# Patient Record
Sex: Male | Born: 1945 | Race: Black or African American | Hispanic: No | Marital: Married | Smoking: Never smoker
Health system: Southern US, Community
[De-identification: ages and names within clinical notes are randomized; demographics above are authoritative.]

## PROBLEM LIST (undated history)

## (undated) DIAGNOSIS — H409 Unspecified glaucoma: Secondary | ICD-10-CM

## (undated) DIAGNOSIS — I10 Essential (primary) hypertension: Secondary | ICD-10-CM

## (undated) DIAGNOSIS — C9000 Multiple myeloma not having achieved remission: Secondary | ICD-10-CM

## (undated) DIAGNOSIS — E785 Hyperlipidemia, unspecified: Secondary | ICD-10-CM

## (undated) DIAGNOSIS — G629 Polyneuropathy, unspecified: Secondary | ICD-10-CM

## (undated) DIAGNOSIS — M1990 Unspecified osteoarthritis, unspecified site: Secondary | ICD-10-CM

## (undated) DIAGNOSIS — O2230 Deep phlebothrombosis in pregnancy, unspecified trimester: Secondary | ICD-10-CM

## (undated) HISTORY — PX: PARATHYROID EXPLORATION: SHX732

## (undated) HISTORY — DX: Hyperlipidemia, unspecified: E78.5

## (undated) HISTORY — DX: Unspecified osteoarthritis, unspecified site: M19.90

## (undated) HISTORY — DX: Essential (primary) hypertension: I10

## (undated) HISTORY — DX: Multiple myeloma not having achieved remission: C90.00

## (undated) HISTORY — DX: Deep phlebothrombosis in pregnancy, unspecified trimester: O22.30

## (undated) HISTORY — PX: EYE SURGERY: SHX253

## (undated) HISTORY — PX: JOINT REPLACEMENT: SHX530

## (undated) HISTORY — PX: CATARACT EXTRACTION, BILATERAL: SHX1313

## (undated) HISTORY — PX: VASECTOMY: SHX75

## (undated) HISTORY — DX: Unspecified glaucoma: H40.9

## (undated) HISTORY — DX: Polyneuropathy, unspecified: G62.9

---

## 2015-03-21 DIAGNOSIS — M1710 Unilateral primary osteoarthritis, unspecified knee: Secondary | ICD-10-CM | POA: Insufficient documentation

## 2015-04-20 DIAGNOSIS — R531 Weakness: Secondary | ICD-10-CM | POA: Insufficient documentation

## 2015-04-20 DIAGNOSIS — M1611 Unilateral primary osteoarthritis, right hip: Secondary | ICD-10-CM | POA: Insufficient documentation

## 2015-04-20 DIAGNOSIS — R262 Difficulty in walking, not elsewhere classified: Secondary | ICD-10-CM | POA: Insufficient documentation

## 2015-04-20 DIAGNOSIS — M1711 Unilateral primary osteoarthritis, right knee: Secondary | ICD-10-CM | POA: Insufficient documentation

## 2015-04-20 DIAGNOSIS — S83206A Unspecified tear of unspecified meniscus, current injury, right knee, initial encounter: Secondary | ICD-10-CM | POA: Insufficient documentation

## 2015-12-21 DIAGNOSIS — E7800 Pure hypercholesterolemia, unspecified: Secondary | ICD-10-CM | POA: Insufficient documentation

## 2016-09-30 DIAGNOSIS — N181 Chronic kidney disease, stage 1: Secondary | ICD-10-CM | POA: Insufficient documentation

## 2016-09-30 DIAGNOSIS — R809 Proteinuria, unspecified: Secondary | ICD-10-CM | POA: Insufficient documentation

## 2016-11-24 HISTORY — PX: KNEE SURGERY: SHX244

## 2017-08-06 DIAGNOSIS — Z96651 Presence of right artificial knee joint: Secondary | ICD-10-CM | POA: Insufficient documentation

## 2017-08-06 DIAGNOSIS — H409 Unspecified glaucoma: Secondary | ICD-10-CM | POA: Insufficient documentation

## 2017-08-06 DIAGNOSIS — N400 Enlarged prostate without lower urinary tract symptoms: Secondary | ICD-10-CM | POA: Insufficient documentation

## 2017-08-10 DIAGNOSIS — M25561 Pain in right knee: Secondary | ICD-10-CM | POA: Insufficient documentation

## 2017-08-10 DIAGNOSIS — M6281 Muscle weakness (generalized): Secondary | ICD-10-CM | POA: Insufficient documentation

## 2017-09-18 DIAGNOSIS — M2560 Stiffness of unspecified joint, not elsewhere classified: Secondary | ICD-10-CM | POA: Insufficient documentation

## 2017-09-18 DIAGNOSIS — M51369 Other intervertebral disc degeneration, lumbar region without mention of lumbar back pain or lower extremity pain: Secondary | ICD-10-CM | POA: Insufficient documentation

## 2017-09-18 DIAGNOSIS — M48061 Spinal stenosis, lumbar region without neurogenic claudication: Secondary | ICD-10-CM | POA: Insufficient documentation

## 2018-08-10 DIAGNOSIS — M542 Cervicalgia: Secondary | ICD-10-CM | POA: Insufficient documentation

## 2018-08-10 DIAGNOSIS — M25512 Pain in left shoulder: Secondary | ICD-10-CM | POA: Insufficient documentation

## 2018-08-10 DIAGNOSIS — M4000 Postural kyphosis, site unspecified: Secondary | ICD-10-CM | POA: Insufficient documentation

## 2019-01-25 DIAGNOSIS — E213 Hyperparathyroidism, unspecified: Secondary | ICD-10-CM | POA: Insufficient documentation

## 2021-11-27 ENCOUNTER — Telehealth: Payer: Self-pay | Admitting: Hematology

## 2021-11-27 NOTE — Telephone Encounter (Signed)
Scheduled appt per 1/4 referral. Pt is aware of appt date and time. Pt is aware to arrive 15 mins prior to appt time.  °

## 2021-12-12 ENCOUNTER — Other Ambulatory Visit: Payer: Self-pay

## 2021-12-12 ENCOUNTER — Inpatient Hospital Stay: Payer: Medicare Other

## 2021-12-12 ENCOUNTER — Inpatient Hospital Stay: Payer: Medicare Other | Attending: Hematology | Admitting: Hematology

## 2021-12-12 VITALS — BP 130/76 | HR 72 | Temp 97.5°F | Resp 20 | Wt 226.5 lb

## 2021-12-12 DIAGNOSIS — Z87891 Personal history of nicotine dependence: Secondary | ICD-10-CM | POA: Diagnosis not present

## 2021-12-12 DIAGNOSIS — D472 Monoclonal gammopathy: Secondary | ICD-10-CM | POA: Insufficient documentation

## 2021-12-12 DIAGNOSIS — K219 Gastro-esophageal reflux disease without esophagitis: Secondary | ICD-10-CM | POA: Diagnosis not present

## 2021-12-12 DIAGNOSIS — I10 Essential (primary) hypertension: Secondary | ICD-10-CM | POA: Diagnosis not present

## 2021-12-12 DIAGNOSIS — R251 Tremor, unspecified: Secondary | ICD-10-CM

## 2021-12-12 LAB — CBC WITH DIFFERENTIAL/PLATELET
Abs Immature Granulocytes: 0 10*3/uL (ref 0.00–0.07)
Basophils Absolute: 0 10*3/uL (ref 0.0–0.1)
Basophils Relative: 1 %
Eosinophils Absolute: 0 10*3/uL (ref 0.0–0.5)
Eosinophils Relative: 2 %
HCT: 39.6 % (ref 39.0–52.0)
Hemoglobin: 13.4 g/dL (ref 13.0–17.0)
Immature Granulocytes: 0 %
Lymphocytes Relative: 46 %
Lymphs Abs: 0.9 10*3/uL (ref 0.7–4.0)
MCH: 30.3 pg (ref 26.0–34.0)
MCHC: 33.8 g/dL (ref 30.0–36.0)
MCV: 89.6 fL (ref 80.0–100.0)
Monocytes Absolute: 0.2 10*3/uL (ref 0.1–1.0)
Monocytes Relative: 12 %
Neutro Abs: 0.8 10*3/uL — ABNORMAL LOW (ref 1.7–7.7)
Neutrophils Relative %: 39 %
Platelets: 182 10*3/uL (ref 150–400)
RBC: 4.42 MIL/uL (ref 4.22–5.81)
RDW: 13 % (ref 11.5–15.5)
WBC: 1.9 10*3/uL — ABNORMAL LOW (ref 4.0–10.5)
nRBC: 0 % (ref 0.0–0.2)

## 2021-12-12 LAB — CMP (CANCER CENTER ONLY)
ALT: 26 U/L (ref 0–44)
AST: 27 U/L (ref 15–41)
Albumin: 4.2 g/dL (ref 3.5–5.0)
Alkaline Phosphatase: 37 U/L — ABNORMAL LOW (ref 38–126)
Anion gap: 6 (ref 5–15)
BUN: 22 mg/dL (ref 8–23)
CO2: 28 mmol/L (ref 22–32)
Calcium: 10.7 mg/dL — ABNORMAL HIGH (ref 8.9–10.3)
Chloride: 103 mmol/L (ref 98–111)
Creatinine: 1.1 mg/dL (ref 0.61–1.24)
GFR, Estimated: >60 mL/min (ref >60)
Glucose, Bld: 99 mg/dL (ref 70–99)
Potassium: 4.1 mmol/L (ref 3.5–5.1)
Sodium: 137 mmol/L (ref 135–145)
Total Bilirubin: 0.6 mg/dL (ref 0.3–1.2)
Total Protein: 7.7 g/dL (ref 6.5–8.1)

## 2021-12-12 LAB — SEDIMENTATION RATE: Sed Rate: 5 mm/hr (ref 0–16)

## 2021-12-12 LAB — TSH: TSH: 1.295 u[IU]/mL (ref 0.320–4.118)

## 2021-12-12 LAB — LACTATE DEHYDROGENASE: LDH: 165 U/L (ref 98–192)

## 2021-12-12 NOTE — Progress Notes (Addendum)
[name]   HEMATOLOGY/ONCOLOGY CONSULTATION NOTE  Date of Service: 12/12/2021  Patient Care Team: [name], MD as PCP - General (Family Medicine)  CHIEF COMPLAINTS/PURPOSE OF CONSULTATION:  Smoldering Myeloma  HISTORY OF PRESENTING ILLNESS:   Nathan Martinez is a wonderful 76 y.o. 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 Jesusita Oka MD at [facility] oncology hematology practice.  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 He was most recently seen by his previous oncologist on 08/29/2021 at which time he was noted to have stable smoldering myeloma with no indication for treatment.  His last PET CT scan was apparently on 07/06/2020 and showed no pathologic marrow uptake or lytic lesions in the bones or any signs of extraosseous plasmacytomas.  His last bone marrow biopsy was done on 08/17/2021 and was noted to have 30% overall marrow cellularity, 15% abnormal plasma cells with kappa light chain clonality, normal plasmablastic , Congo red stain negative .mild focal fibrosis grade 1, adequate iron levels Myeloma FISH panel apparently unremarkable.  Last available labs from 08/29/2021 showed  -CBC with a hemoglobin of 12.7, WBC count of 2.47k with an ANC of 1.36k and platelets of 163k -CMP 08/17/2021: Sodium 139, potassium 4.5, creatinine 1.07, calcium 10.5 normal LFTs -Last M spike was noted to be 0.99 with a free kappa lambda ratio of 7.81.  Patient currently notes no new focal bone pains. No fevers no chills no night sweats no unexpected weight loss. Has been eating and drinking well. Notes he is settling into [place]  quite well without any concerns.  MEDICAL HISTORY:  Intermittent hypercalcemia - primary hyperparathyroidism status post parathyroidectomy in 2019 HTN HLD Glaucoma History of Helicobacter pylori infection previously treated Chronic leukopenia/neutropenia History of present iron deficiency anemia  SURGICAL HISTORY:  Rt TKA 2018 Parathyroidectomy 2019 B/l cataracts sx 2021  SOCIAL HISTORY: Social History   Socioeconomic History   Marital status: Married    Spouse name: Not on file   Number of children: Not on file   Years of education: Not on file   Highest education level: Not on file  Occupational History   Not on file  Tobacco Use   Smoking status: Not on file   Smokeless tobacco: Not on file  Substance and Sexual Activity   Alcohol use: Not on file   Drug use: Not on file   Sexual activity: Not on file  Other Topics Concern   Not on file  Social History Narrative   Not on file   Social Determinants of Health   Financial Resource Strain: Not on file  Food Insecurity: Not on file  Transportation Needs: Not on file  Physical Activity: Not on file  Stress: Not on file  Social Connections: Not on file  Intimate Partner Violence: Not on file  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  MEDICATIONS:   Krill oil Olmesartan hydrochlorothiazide 20-12.5 mg Ferrex 150 mg p.o.  daily Aspirin 81 mg p.o. daily Atorvastatin 20 mg p.o. daily Multivitamin 1 tablet p.o. daily Ergocalciferol 50,000 units once weekly   REVIEW OF SYSTEMS:    10 Point review of Systems was done is negative except as noted above.  PHYSICAL EXAMINATION: ECOG PERFORMANCE STATUS: 1 - Symptomatic but completely ambulatory  . Vitals:   12/12/21 1133  BP: 130/75  Pulse: 72  Resp: 20  Temp: (!) 97.5 F  (36.4 C)  SpO2: 100%   Filed Weights   12/12/21 1133  Weight: 226 lb 8 oz (102.7 kg)   .There is no height or weight on file to calculate BMI.  GENERAL:alert, in no acute distress and comfortable SKIN: no acute rashes, no significant lesions EYES: conjunctiva are pink and non-injected, sclera anicteric OROPHARYNX: MMM, no exudates, no oropharyngeal erythema or ulceration NECK: supple, no JVD LYMPH:  no palpable lymphadenopathy in the cervical, axillary or inguinal regions LUNGS: clear to auscultation b/l with normal respiratory effort HEART: regular rate & rhythm ABDOMEN:  normoactive bowel sounds , non tender, not distended. Extremity: no pedal edema PSYCH: alert & oriented x 3 with fluent speech NEURO: no focal motor/sensory deficits  LABORATORY DATA:  I have reviewed the data as listed  .No flowsheet data found.  .No flowsheet data found.     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 PLAN -Patient's history and physical was taken and reviewed in detail. -His outside oncology records were reviewed in detail and confirmed with him. -He was last seen by his previous oncologist in October 2022.   -Last PET scan was done in August 2021 and showed no signs of active myeloma. -he last had bone marrow biopsy in September 2022 which showed 15% kappa restricted plasma cells with a bone marrow cellularity of 30%.  Myeloma FISH panel reportedly negative. -Patient has had intermittent hypercalcemia which was thought to previously be due to primary hyperparathyroidism and he had parathyroidectomy in 2019 but continues to have some mild hypercalcemia possibly due to his hydrochlorothiazide. -He has no focal  symptoms suggestive of progression to active multiple myeloma at this time. -We shall get updated labs today to evaluate for any changes since his last labs in October 2022. -We shall discuss need for PET CT scan based on his repeat labs. -He previously has had a history of iron deficiency and Helicobacter pylori infection which was treated.  Last labs showed stable hemoglobin of 12.4 with normal creatinine mild hypercalcemia of 10.5.  Follow-up Labs today Phone visit in 1 week  . Orders Placed This Encounter  Procedures   CBC with Differential/Platelet    Standing Status:   Future    Number of Occurrences:   1    Standing Expiration Date:   12/12/2022   CMP ([place] only)    Standing Status:   Future    Number of Occurrences:   1    Standing Expiration Date:   12/12/2022   Multiple Myeloma Panel (SPEP&IFE w/QIG)    Standing Status:   Future    Number of Occurrences:   1    Standing Expiration Date:   12/12/2022   Kappa/lambda light chains    Standing Status:   Future    Number of Occurrences:   1  Standing Expiration Date:   12/12/2022   Sedimentation rate    Standing Status:   Future    Number of Occurrences:   1    Standing Expiration Date:   12/12/2022   Beta 2 microglobulin, serum    Standing Status:   Future    Number of Occurrences:   1    Standing Expiration Date:   12/12/2022   Lactate dehydrogenase    Standing Status:   Future    Number of Occurrences:   1    Standing Expiration Date:   12/12/2022   TSH    Standing Status:   Future    Number of Occurrences:   1    Standing Expiration Date:   12/12/2022   PTH, intact and calcium    Standing Status:   Future    Number of Occurrences:   1    Standing Expiration Date:   12/12/2022      All of the patients questions were answered with apparent satisfaction. The patient knows to call the clinic with any problems, questions or concerns.  I spent 45 minutes counseling the patient face to face. The total time  spent in the appointment was 60 minutes extensively reviewing outside oncology records, H&P, education regarding multiple myeloma and review of all previous work-up discussion of plan of care and documentation.  Sullivan Lone MD MS AAHIVMS [facility] Hematology/Oncology Physician [place]   12/12/2021 11:41 AM

## 2021-12-12 NOTE — Patient Instructions (Signed)
Thank you for choosing [facility] to provide your care.   Should you have questions after your visit to the [facility] ([facility]), please contact this office at [phone] between 8:30 AM and 4:30 PM.  Voice mails left after 4:00 PM may not be returned until the following business day.  Calls received after 4:30 PM will be answered by an off-site Nurse Triage Line.    Prescription Refills:  Please have your pharmacy contact us directly for most prescription requests.  Contact the office directly for refills of narcotics (pain medications). Allow 48-72 hours for refills.  Appointments: Please contact the [facility] scheduling department [phone] for questions regarding [facility] appointment scheduling.  Contact the schedulers with any scheduling changes so that your appointment can be rescheduled in a timely manner.   Central Scheduling for [place] [phone] - Call to schedule procedures such as PET scans, CT scans, MRI, Ultrasound, etc.  To afford each patient quality time with our providers, please arrive 30 minutes before your scheduled appointment time.  If you arrive late for your appointment, you may be asked to reschedule.  We strive to give you quality time with our providers, and arriving late affects you and other patients whose appointments are after yours. If you are a no show for multiple scheduled visits, you may be dismissed from the clinic at the providers discretion.     Resources: [facility] Social Workers [phone] for additional information on assistance programs or assistance connecting with community support programs   [place] DSS  [phone]: Information regarding food stamps, Medicaid, and utility assistance [place] Access [place] [phone]   [place] [employer]'s shared-ride transportation service for eligible riders who have a disability that prevents them from riding the fixed route bus.   Medicare Rights Center 800-333-4114  Helps people with Medicare understand their rights and benefits, navigate the Medicare system, and secure the quality healthcare they deserve [employer] 800-227-2345 Assists patients locate various types of support and financial assistance Cancer Care: 1-800-813-HOPE (4673) Provides financial assistance, online support groups, medication/co-pay assistance.   Transportation Assistance for appointments at [facility]: Transportation Coordinator [phone]  Again, thank you for choosing [facility] for your care.       

## 2021-12-13 LAB — PTH, INTACT AND CALCIUM
Calcium, Total (PTH): 10.7 mg/dL — ABNORMAL HIGH (ref 8.6–10.2)
PTH: 34 pg/mL (ref 15–65)

## 2021-12-13 LAB — KAPPA/LAMBDA LIGHT CHAINS
Kappa free light chain: 424.8 mg/L — ABNORMAL HIGH (ref 3.3–19.4)
Kappa, lambda light chain ratio: 37.26 — ABNORMAL HIGH (ref 0.26–1.65)
Lambda free light chains: 11.4 mg/L (ref 5.7–26.3)

## 2021-12-13 LAB — BETA 2 MICROGLOBULIN, SERUM: Beta-2 Microglobulin: 1.7 mg/L (ref 0.6–2.4)

## 2021-12-17 LAB — MULTIPLE MYELOMA PANEL, SERUM
Albumin SerPl Elph-Mcnc: 4 g/dL (ref 2.9–4.4)
Albumin/Glob SerPl: 1.3 (ref 0.7–1.7)
Alpha 1: 0.2 g/dL (ref 0.0–0.4)
Alpha2 Glob SerPl Elph-Mcnc: 0.6 g/dL (ref 0.4–1.0)
B-Globulin SerPl Elph-Mcnc: 0.8 g/dL (ref 0.7–1.3)
Gamma Glob SerPl Elph-Mcnc: 1.8 g/dL (ref 0.4–1.8)
Globulin, Total: 3.3 g/dL (ref 2.2–3.9)
IgA: 56 mg/dL — ABNORMAL LOW (ref 61–437)
IgG (Immunoglobin G), Serum: 1924 mg/dL — ABNORMAL HIGH (ref 603–1613)
IgM (Immunoglobulin M), Srm: 15 mg/dL (ref 15–143)
M Protein SerPl Elph-Mcnc: 1 g/dL — ABNORMAL HIGH
Total Protein ELP: 7.3 g/dL (ref 6.0–8.5)

## 2021-12-18 ENCOUNTER — Inpatient Hospital Stay (HOSPITAL_BASED_OUTPATIENT_CLINIC_OR_DEPARTMENT_OTHER): Payer: Medicare Other | Admitting: Hematology

## 2021-12-18 DIAGNOSIS — D472 Monoclonal gammopathy: Secondary | ICD-10-CM

## 2021-12-18 MED ORDER — OLMESARTAN MEDOXOMIL 20 MG PO TABS: 20.0000 mg | ORAL_TABLET | Freq: Every day | ORAL | 2 refills | Status: DC

## 2021-12-23 ENCOUNTER — Telehealth: Payer: Self-pay | Admitting: Family Medicine

## 2021-12-23 NOTE — Telephone Encounter (Signed)
Pt will be establishing his care with you in March he would like to know if his wife could also be a pt as well? Please advise

## 2021-12-24 NOTE — Telephone Encounter (Signed)
Ok. I will agree to see her. Thanks.

## 2021-12-24 NOTE — Telephone Encounter (Signed)
Please provide her MR # for review if possible.

## 2021-12-24 NOTE — Progress Notes (Addendum)
[name]   HEMATOLOGY/ONCOLOGY PHONE VISIT NOTE  Date of Service: .12/18/2021   Patient Care Team: [name], MD as PCP - General (Family Medicine)  CHIEF COMPLAINTS/PURPOSE OF CONSULTATION:  Review of labs for plasma cell dyscrasia/smoldering myeloma  HISTORY OF PRESENTING ILLNESS:   Nathan Martinez is a wonderful 76 y.o. 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 Jesusita Oka MD at [facility] oncology hematology practice.  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 He was most recently seen by his previous oncologist on 08/29/2021 at which time he was noted to have stable smoldering myeloma with no indication for treatment.  His last PET CT scan was apparently on 07/06/2020 and showed no pathologic marrow uptake or lytic lesions in the bones or any signs of extraosseous plasmacytomas.  His last bone marrow biopsy was done on 08/17/2021 and was noted to have 30% overall marrow cellularity, 15% abnormal plasma cells with kappa light chain clonality, normal plasmablastic , Congo red stain negative .mild focal fibrosis grade 1, adequate iron levels Myeloma FISH panel apparently unremarkable.  Last available labs from 08/29/2021 showed  -CBC with a hemoglobin of 12.7, WBC count of 2.47k with an ANC of 1.36k and platelets of 163k -CMP 08/17/2021: Sodium 139, potassium 4.5, creatinine 1.07, calcium 10.5 normal LFTs -Last M spike was noted to be 0.99 with a free kappa lambda ratio of 7.81.    INTERVAL HISTORY  .I connected with Garrus Gauthreaux Gassert on 12/18/2021 at  3:20 PM EST by telephone visit and verified that I am speaking with  the correct person using two identifiers.   I discussed the limitations, risks, security and privacy concerns of performing an evaluation and management service by telemedicine and the availability of in-person appointments. I also discussed with the patient that there may be a patient responsible charge related to this service. The patient expressed understanding and agreed to proceed.   Other persons participating in the visit and their role in the encounter: patients wife   Patients location: Home Providers location: [place]  Chief Complaint: Discussion of labs for plasma cell dyscrasia.  Patient notes no acute new symptoms since his last clinic visit with [place] on 12/12/2021.  Labs done in 12/12/2021 show CBC with chronic leukopenia WBC count of 1.9k with an ANC of 800, normal hemoglobin of 13.4, normal platelets of 182k CMP shows stable creatinine of 1.1 with mild hypercalcemia of 10.7 normal LFTs Myeloma panel shows IgG kappa M spike of 1 g/dL which is not significantly changed from October 2022. Kappa free light chains for 24 lambda free light chains 11.4 with a kappa lambda ratio of 37.26. Normal LDH 165 TSH 1.29 Beta-2 microglobulin 1.7 Sed rate 5 PTH within normal limits at 34.  MEDICAL HISTORY:  Intermittent hypercalcemia - primary hyperparathyroidism status post parathyroidectomy in 2019 HTN HLD Glaucoma History of Helicobacter pylori infection previously treated Chronic leukopenia/neutropenia History of present iron deficiency anemia  SURGICAL HISTORY:  Rt TKA 2018 Parathyroidectomy 2019 B/l cataracts sx 2021  SOCIAL HISTORY: Social History   Socioeconomic History   Marital status: Married  Spouse name: Not on file   Number of children: Not on file   Years of education: Not on file   Highest education level: Not on file  Occupational History   Not on file  Tobacco Use   Smoking status: Not on file   Smokeless tobacco: Not on file   Substance and Sexual Activity   Alcohol use: Not on file   Drug use: Not on file   Sexual activity: Not on file  Other Topics Concern   Not on file  Social History Narrative   Not on file   Social Determinants of Health   Financial Resource Strain: Not on file  Food Insecurity: Not on file  Transportation Needs: Not on file  Physical Activity: Not on file  Stress: Not on file  Social Connections: Not on file  Intimate Partner Violence: Not on file  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  MEDICATIONS:   Krill oil Olmesartan hydrochlorothiazide 20-12.5 mg Ferrex 150 mg p.o. daily Aspirin 81 mg p.o. daily Atorvastatin 20 mg p.o. daily Multivitamin 1 tablet p.o. daily Ergocalciferol 50,000 units once weekly   REVIEW OF SYSTEMS:    .10 Point review of Systems was done is negative except as noted above.  PHYSICAL EXAMINATION: Telemedicine visit  LABORATORY DATA:  I have reviewed the data as listed  . CBC Latest Ref Rng & Units 12/12/2021  WBC 4.0 - 10.5 K/uL 1.9(L)  Hemoglobin 13.0 - 17.0 g/dL 13.4  Hematocrit 39.0 - 52.0 % 39.6  Platelets 150 - 400 K/uL 182    . CMP Latest Ref Rng & Units 12/12/2021 12/12/2021  Glucose 70 - 99 mg/dL 99 -  BUN 8 - 23 mg/dL 22 -  Creatinine 0.61 - 1.24 mg/dL 1.10 -  Sodium 135 - 145 mmol/L 137 -  Potassium 3.5 - 5.1 mmol/L 4.1 -  Chloride 98 - 111 mmol/L 103 -  CO2 22 - 32 mmol/L 28 -  Calcium 8.6 - 10.2 mg/dL 10.7(H) 10.7(H)  Total Protein 6.5 - 8.1 g/dL 7.7 -  Total Bilirubin 0.3 - 1.2 mg/dL 0.6 -  Alkaline Phos 38 - 126 U/L 37(L) -  AST 15 - 41 U/L 27 -  ALT 0 - 44 U/L 26 -   Component     Latest Ref Rng & Units 12/12/2021  IgG (Immunoglobin G), Serum     603 - 1,613 mg/dL 1,924 (H)  IgA     61 - 437 mg/dL 56 (L)  IgM  (Immunoglobulin M), Srm     15 - 143 mg/dL 15  Total Protein ELP     6.0 - 8.5 g/dL 7.3  Albumin SerPl Elph-Mcnc     2.9 - 4.4 g/dL 4.0  Alpha 1     0.0 - 0.4 g/dL 0.2  Alpha2 Glob SerPl Elph-Mcnc     0.4 - 1.0 g/dL 0.6  B-Globulin SerPl Elph-Mcnc     0.7 - 1.3 g/dL 0.8  Gamma Glob SerPl Elph-Mcnc     0.4 - 1.8 g/dL 1.8  M Protein SerPl Elph-Mcnc     Not Observed g/dL 1.0 (H)  Globulin, Total     2.2 - 3.9 g/dL 3.3  Albumin/Glob SerPl     0.7 - 1.7 1.3  IFE 1      Comment (A)  Please Note (HCV):      Comment  PTH, Intact     15 - 65 pg/mL 34  Calcium, Total (PTH)     8.6 - 10.2 mg/dL 10.7 (H)  PTH Interp      Comment  Kappa free light chain     3.3 - 19.4 mg/L 424.8 (H)  Lambda free light chains     5.7 - 26.3 mg/L 11.4  Kappa, lambda light chain ratio     0.26 - 1.65 37.26 (H)  TSH     0.320 - 4.118 uIU/mL 1.295  LDH     98 - 192 U/L 165  Beta-2 Microglobulin     0.6 - 2.4 mg/L 1.7  Sed Rate     0 - 16 mm/hr 5       RADIOGRAPHIC STUDIES:    ASSESSMENT & PLAN:   76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  3) intermittent hypercalcemia.  Previously attributed to primary hyperparathyroidism and patient is status post parathyroidectomy in 2019. Still with intermittent hypercalcemia likely related to his hydrochlorothiazide. PLAN -Discussed lab results in details Labs done in 12/12/2021 show CBC with chronic leukopenia WBC count of 1.9k with an ANC of 800, normal hemoglobin of 13.4, normal platelets of 182k CMP shows stable creatinine of 1.1 with mild hypercalcemia of 10.7  normal LFTs Myeloma panel shows IgG kappa M spike of 1 g/dL which is not significantly changed from October 2022. Kappa free light chains for 24 lambda free light chains 11.4 with a kappa lambda ratio of 37.26. Normal LDH 165 TSH 1.29 Beta-2 microglobulin 1.7 Sed rate 5 PTH within normal limits at 34. -Patient's blood pressure has been well controlled and we discussed and decided to hold his hydrochlorothiazide to help with resolution of his hypercalcemia. -PET CT scan ordered to evaluate his hypercalcemia to rule out any new bone lesions given persistent hypercalcemia at after his parathyroidectomy. -Discussed neutropenic precautions.  Patient has had no issues with recurrent infections despite his low WBC counts which have been chronic.  Follow-up Pet/CT in 2-3 weeks Labs in 2-3 weeks Phone visit with Dr Irene Limbo in 4 weeks   All of the patients questions were answered with apparent satisfaction. The patient knows to call the clinic with any problems, questions or concerns. Total time spent discussing lab results and answering the patient's questions 20 minutes  Sullivan Lone MD MS AAHIVMS [facility] Hematology/Oncology Physician [facility]     .[name]

## 2021-12-25 NOTE — Telephone Encounter (Signed)
I have scheduled her with Carlota Raspberry

## 2021-12-26 ENCOUNTER — Telehealth: Payer: Self-pay | Admitting: Hematology

## 2021-12-26 NOTE — Telephone Encounter (Signed)
Scheduled per 01/25 los, patient has been called and notified of upcoming appointments. °

## 2021-12-30 ENCOUNTER — Other Ambulatory Visit: Payer: Self-pay | Admitting: Hematology

## 2021-12-30 ENCOUNTER — Telehealth: Payer: Self-pay

## 2021-12-30 DIAGNOSIS — D472 Monoclonal gammopathy: Secondary | ICD-10-CM

## 2021-12-30 NOTE — Telephone Encounter (Signed)
Pt contacted and given dates and times for labs and PET scan. Pt to be NPO 6 hours prior to scan (after 6AM) . Pt acknowledged and verbalized understanding.

## 2022-01-09 ENCOUNTER — Other Ambulatory Visit: Payer: Self-pay

## 2022-01-09 DIAGNOSIS — D472 Monoclonal gammopathy: Secondary | ICD-10-CM

## 2022-01-10 ENCOUNTER — Inpatient Hospital Stay: Payer: Medicare Other | Attending: Hematology

## 2022-01-10 ENCOUNTER — Other Ambulatory Visit: Payer: Self-pay

## 2022-01-10 ENCOUNTER — Encounter (HOSPITAL_COMMUNITY)
Admission: RE | Admit: 2022-01-10 | Discharge: 2022-01-10 | Disposition: A | Discharge status: Home / Self Care | Payer: Medicare Other | Source: Ambulatory Visit | Attending: Hematology | Admitting: Hematology

## 2022-01-10 ENCOUNTER — Inpatient Hospital Stay: Payer: Medicare Other

## 2022-01-10 DIAGNOSIS — M898X8 Other specified disorders of bone, other site: Secondary | ICD-10-CM | POA: Diagnosis not present

## 2022-01-10 DIAGNOSIS — D472 Monoclonal gammopathy: Secondary | ICD-10-CM | POA: Diagnosis not present

## 2022-01-10 DIAGNOSIS — N400 Enlarged prostate without lower urinary tract symptoms: Secondary | ICD-10-CM | POA: Insufficient documentation

## 2022-01-10 DIAGNOSIS — M488X6 Other specified spondylopathies, lumbar region: Secondary | ICD-10-CM | POA: Insufficient documentation

## 2022-01-10 DIAGNOSIS — D709 Neutropenia, unspecified: Secondary | ICD-10-CM | POA: Insufficient documentation

## 2022-01-10 LAB — CBC WITH DIFFERENTIAL (CANCER CENTER ONLY)
Abs Immature Granulocytes: 0 10*3/uL (ref 0.00–0.07)
Basophils Absolute: 0 10*3/uL (ref 0.0–0.1)
Basophils Relative: 1 %
Eosinophils Absolute: 0 10*3/uL (ref 0.0–0.5)
Eosinophils Relative: 1 %
HCT: 38.6 % — ABNORMAL LOW (ref 39.0–52.0)
Hemoglobin: 12.9 g/dL — ABNORMAL LOW (ref 13.0–17.0)
Immature Granulocytes: 0 %
Lymphocytes Relative: 43 %
Lymphs Abs: 0.7 10*3/uL (ref 0.7–4.0)
MCH: 30.1 pg (ref 26.0–34.0)
MCHC: 33.4 g/dL (ref 30.0–36.0)
MCV: 90 fL (ref 80.0–100.0)
Monocytes Absolute: 0.2 10*3/uL (ref 0.1–1.0)
Monocytes Relative: 14 %
Neutro Abs: 0.7 10*3/uL — ABNORMAL LOW (ref 1.7–7.7)
Neutrophils Relative %: 41 %
Platelet Count: 165 10*3/uL (ref 150–400)
RBC: 4.29 MIL/uL (ref 4.22–5.81)
RDW: 13.3 % (ref 11.5–15.5)
WBC Count: 1.7 10*3/uL — ABNORMAL LOW (ref 4.0–10.5)
nRBC: 0 % (ref 0.0–0.2)

## 2022-01-10 LAB — CMP (CANCER CENTER ONLY)
ALT: 22 U/L (ref 0–44)
AST: 27 U/L (ref 15–41)
Albumin: 4.1 g/dL (ref 3.5–5.0)
Alkaline Phosphatase: 36 U/L — ABNORMAL LOW (ref 38–126)
Anion gap: 3 — ABNORMAL LOW (ref 5–15)
BUN: 17 mg/dL (ref 8–23)
CO2: 29 mmol/L (ref 22–32)
Calcium: 10.2 mg/dL (ref 8.9–10.3)
Chloride: 105 mmol/L (ref 98–111)
Creatinine: 1.14 mg/dL (ref 0.61–1.24)
GFR, Estimated: >60 mL/min (ref >60)
Glucose, Bld: 103 mg/dL — ABNORMAL HIGH (ref 70–99)
Potassium: 4.5 mmol/L (ref 3.5–5.1)
Sodium: 137 mmol/L (ref 135–145)
Total Bilirubin: 0.6 mg/dL (ref 0.3–1.2)
Total Protein: 7.2 g/dL (ref 6.5–8.1)

## 2022-01-10 LAB — GLUCOSE, CAPILLARY: Glucose-Capillary: 101 mg/dL — ABNORMAL HIGH (ref 70–99)

## 2022-01-10 MED ORDER — FLUDEOXYGLUCOSE F - 18 (FDG) INJECTION: 11.3000 | Freq: Once | INTRAVENOUS | Status: DC

## 2022-01-17 ENCOUNTER — Inpatient Hospital Stay (HOSPITAL_BASED_OUTPATIENT_CLINIC_OR_DEPARTMENT_OTHER): Payer: Medicare Other | Admitting: Hematology

## 2022-01-17 ENCOUNTER — Other Ambulatory Visit: Payer: Self-pay

## 2022-01-17 VITALS — BP 139/79 | HR 70 | Temp 97.7°F | Resp 18 | Wt 229.6 lb

## 2022-01-17 DIAGNOSIS — D709 Neutropenia, unspecified: Secondary | ICD-10-CM | POA: Diagnosis not present

## 2022-01-17 DIAGNOSIS — C9000 Multiple myeloma not having achieved remission: Secondary | ICD-10-CM | POA: Diagnosis not present

## 2022-01-17 DIAGNOSIS — D472 Monoclonal gammopathy: Secondary | ICD-10-CM | POA: Insufficient documentation

## 2022-01-17 MED ORDER — POLYSACCHARIDE IRON COMPLEX 150 MG PO CAPS: 150.0000 mg | ORAL_CAPSULE | Freq: Every day | ORAL | 3 refills | Status: DC

## 2022-01-23 ENCOUNTER — Other Ambulatory Visit: Payer: Self-pay

## 2022-01-23 DIAGNOSIS — D472 Monoclonal gammopathy: Secondary | ICD-10-CM

## 2022-01-23 MED ORDER — POLYSACCHARIDE IRON COMPLEX 150 MG PO CAPS: 150.0000 mg | ORAL_CAPSULE | Freq: Every day | ORAL | 3 refills | Status: DC

## 2022-01-23 NOTE — Progress Notes (Signed)
[name]   HEMATOLOGY/ONCOLOGY PHONE VISIT NOTE  Date of Service: .01/17/2022   Patient Care Team: [name], MD as PCP - General (Family Medicine)  CHIEF COMPLAINTS/PURPOSE OF CONSULTATION:  Follow-up to discuss PET CT scan results and concern for progression to active myeloma  HISTORY OF PRESENTING ILLNESS:  Please see previous note for details of initial presentation  INTERVAL HISTORY  Nathan Martinez is here for follow-up of his repeat labs and PET CT scan for continued evaluation and management of his plasma cell dyscrasia.  Patient is accompanied by his wife for this clinic visit.  He has had a history of smoldering myeloma and recently had hypercalcemia which had been intermittent and was previously attributed to hyperparathyroidism and recently appears to be related to hydrochlorothiazide use. He notes no new symptoms today.  Labs from 01/10/2022 CBC shows hemoglobin of 12.9, WBC count of 1.7k with ANC of 700 and platelets of 165k CMP shows normal creatinine of 1.14 with normalization of his calcium level to 10.2 after stopping his hydrochlorothiazide. 12/12/2021 myeloma panel shows IgG level of 1924 with an M spike of 1 g/dL.  PET CT scan done on 01/10/2022 shows 2 small hypermetabolic foci involving the left anterior eighth rib and L2 vertebral body.  These are concerning for small myelomatous lesions.  No other osseous abnormalities or extraosseous myeloma noted.   MEDICAL HISTORY:  Intermittent hypercalcemia - primary hyperparathyroidism status post parathyroidectomy in 2019 HTN HLD Glaucoma History of Helicobacter pylori infection previously treated Chronic leukopenia/neutropenia History of present iron deficiency anemia  SURGICAL HISTORY:  Rt TKA 2018 Parathyroidectomy 2019 B/l cataracts sx 2021  SOCIAL HISTORY: Social History   Socioeconomic History   Marital status: Married    Spouse name: Not on file   Number of children: Not on file   Years of education:  Not on file   Highest education level: Not on file  Occupational History   Not on file  Tobacco Use   Smoking status: Not on file   Smokeless tobacco: Not on file  Substance and Sexual Activity   Alcohol use: Not on file   Drug use: Not on file   Sexual activity: Not on file  Other Topics Concern   Not on file  Social History Narrative   Not on file   Social Determinants of Health   Financial Resource Strain: Not on file  Food Insecurity: Not on file  Transportation Needs: Not on file  Physical Activity: Not on file  Stress: Not on file  Social Connections: Not on file  Intimate Partner Violence: Not on file  Ex smoker (quit 1976) Ex secret service agent ETOH social use  FAMILY HISTORY: Sister - uterine/ovarian cancer 11's  Mother-- lived into her 85's Father - died in 68's - fall/trauma  ALLERGIES:  has no allergies on file.  MEDICATIONS:   Krill oil Olmesartan hydrochlorothiazide 20-12.5 mg Ferrex 150 mg p.o. daily Aspirin 81 mg p.o. daily Atorvastatin 20 mg p.o. daily Multivitamin 1 tablet p.o. daily Ergocalciferol 50,000 units once weekly   REVIEW OF SYSTEMS:    10 Point review of Systems was done is negative except as noted above.  PHYSICAL EXAMINATION: .BP 139/79    Pulse 70    Temp 97.7 F (36.5 C)    Resp 18    Wt 229 lb 9.6 oz (104.1 kg)    SpO2 99%  NAD GENERAL:alert, in no acute distress and comfortable SKIN: no acute rashes, no significant lesions EYES: conjunctiva are pink and  non-injected, sclera anicteric OROPHARYNX: MMM, no exudates, no oropharyngeal erythema or ulceration NECK: supple, no JVD LYMPH:  no palpable lymphadenopathy in the cervical, axillary or inguinal regions LUNGS: clear to auscultation b/l with normal respiratory effort HEART: regular rate & rhythm ABDOMEN:  normoactive bowel sounds , non tender, not distended. Extremity: no pedal edema PSYCH: alert & oriented x 3 with fluent speech NEURO: no focal motor/sensory  deficits   LABORATORY DATA:  I have reviewed the data as listed  . CBC Latest Ref Rng & Units 01/10/2022 12/12/2021  WBC 4.0 - 10.5 K/uL 1.7(L) 1.9(L)  Hemoglobin 13.0 - 17.0 g/dL 12.9(L) 13.4  Hematocrit 39.0 - 52.0 % 38.6(L) 39.6  Platelets 150 - 400 K/uL 165 182   .CBC    Component Value Date/Time   WBC 1.7 (L) 01/10/2022 1032   WBC 1.9 (L) 12/12/2021 1225   RBC 4.29 01/10/2022 1032   HGB 12.9 (L) 01/10/2022 1032   HCT 38.6 (L) 01/10/2022 1032   PLT 165 01/10/2022 1032   MCV 90.0 01/10/2022 1032   MCH 30.1 01/10/2022 1032   MCHC 33.4 01/10/2022 1032   RDW 13.3 01/10/2022 1032   LYMPHSABS 0.7 01/10/2022 1032   MONOABS 0.2 01/10/2022 1032   EOSABS 0.0 01/10/2022 1032   BASOSABS 0.0 01/10/2022 1032    . 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  3) intermittent hypercalcemia.  Previously attributed to primary hyperparathyroidism and patient is status post parathyroidectomy in 2019. Still with intermittent  hypercalcemia likely related to his hydrochlorothiazide. PLAN  He has had a history of smoldering myeloma and recently had hypercalcemia which had been intermittent and was previously attributed to hyperparathyroidism and recently appears to be related to hydrochlorothiazide use. He notes no new symptoms today.  Labs from 01/10/2022 CBC shows hemoglobin of 12.9, WBC count of 1.7k with ANC of 700 and platelets of 165k CMP shows normal creatinine of 1.14 with normalization of his calcium level to 10.2 after stopping his hydrochlorothiazide. 12/12/2021 myeloma panel shows IgG level of 1924 with an M spike of 1 g/dL.  PET CT scan done on 01/10/2022 shows 2 small hypermetabolic foci involving the left anterior eighth rib and L2 vertebral body.  These are concerning for small myelomatous lesions.  No other osseous abnormalities or extraosseous myeloma noted.  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  We discussed and patient is agreeable to repeat bone marrow biopsy.  We will have radiology do this.  We discussed the natural history of myeloma, features of active multiple myeloma, typical treatment interventions, goals of care, expected outcomes. We discussed that myeloma treatments are typically not curative but could put him in remission.  Would recommend treatment with a triplet of Velcade Revlimid dexamethasone. If bone marrow shows cytogenetics would switch Velcade to carfilzomib.  Potential side effects and highlights of each of the medications were discussed. We will set him up for chemo counseling for VRD regimen. We will set him up for bone marrow biopsy. We will tentatively plan to start treatment as per patient's wishes in about a month or so. We also discussed getting dental  clearance since he will need to be on Zometa every 4 weeks.  All the patient's and his wife's questions were answered in details.  Follow-up CT bone marrow aspiration and biopsy in 1 week Chemo counseling for VRD in 1 to 2 weeks Per patient's request would start VRD treatment in about 4 weeks.  The total time spent in the appointment was 35 minutes minutes*.  All of the patient's questions were answered with apparent satisfaction. The patient knows to call the clinic with any problems, questions or concerns.   Sullivan Lone MD MS AAHIVMS [facility] Hematology/Oncology Physician [facility]  .*Total Encounter Time as defined by the Centers for Medicare and Medicaid Services includes, in addition to the face-to-face time of a patient visit (documented in the note above) non-face-to-face time: obtaining and reviewing outside history, ordering and reviewing medications, tests or procedures, care coordination (communications with other health care professionals or caregivers) and documentation in the medical record.     Marland KitchenMarland Kitchen

## 2022-01-24 ENCOUNTER — Telehealth: Payer: Self-pay | Admitting: Hematology

## 2022-01-24 ENCOUNTER — Other Ambulatory Visit: Payer: Self-pay | Admitting: Hematology

## 2022-01-24 DIAGNOSIS — Z7189 Other specified counseling: Secondary | ICD-10-CM

## 2022-01-24 DIAGNOSIS — C9000 Multiple myeloma not having achieved remission: Secondary | ICD-10-CM

## 2022-01-24 NOTE — Progress Notes (Signed)
START ON PATHWAY REGIMEN - Multiple Myeloma and Other Plasma Cell Dyscrasias     A cycle is every 21 days:     Bortezomib      Lenalidomide      Dexamethasone   **Always confirm dose/schedule in your pharmacy ordering system**  Patient Characteristics: Multiple Myeloma, Newly Diagnosed, Transplant Eligible, Unknown or Awaiting Test Results Disease Classification: Multiple Myeloma R-ISS Staging: Unknown Therapeutic Status: Newly Diagnosed Is Patient Eligible for Transplant<= Transplant Eligible Risk Status: Awaiting Test Results Intent of Therapy: Non-Curative / Palliative Intent, Discussed with Patient 

## 2022-01-24 NOTE — Telephone Encounter (Signed)
.  Called patient to schedule appointment per 3/3 inbasket, patient is aware of date and time.  ? ?

## 2022-01-27 ENCOUNTER — Other Ambulatory Visit (HOSPITAL_COMMUNITY): Payer: Self-pay | Admitting: Physician Assistant

## 2022-01-28 ENCOUNTER — Other Ambulatory Visit: Payer: Self-pay

## 2022-01-28 ENCOUNTER — Encounter (HOSPITAL_COMMUNITY): Payer: Self-pay

## 2022-01-28 ENCOUNTER — Other Ambulatory Visit (HOSPITAL_COMMUNITY): Payer: Self-pay | Admitting: Physician Assistant

## 2022-01-28 ENCOUNTER — Ambulatory Visit (HOSPITAL_COMMUNITY)
Admission: RE | Admit: 2022-01-28 | Discharge: 2022-01-28 | Disposition: A | Discharge status: Home / Self Care | Payer: Medicare Other | Source: Ambulatory Visit | Attending: Hematology | Admitting: Hematology

## 2022-01-28 DIAGNOSIS — E785 Hyperlipidemia, unspecified: Secondary | ICD-10-CM | POA: Insufficient documentation

## 2022-01-28 DIAGNOSIS — C9000 Multiple myeloma not having achieved remission: Secondary | ICD-10-CM | POA: Diagnosis present

## 2022-01-28 DIAGNOSIS — I10 Essential (primary) hypertension: Secondary | ICD-10-CM | POA: Insufficient documentation

## 2022-01-28 DIAGNOSIS — R9389 Abnormal findings on diagnostic imaging of other specified body structures: Secondary | ICD-10-CM | POA: Diagnosis present

## 2022-01-28 DIAGNOSIS — D72819 Decreased white blood cell count, unspecified: Secondary | ICD-10-CM | POA: Diagnosis not present

## 2022-01-28 DIAGNOSIS — H409 Unspecified glaucoma: Secondary | ICD-10-CM | POA: Insufficient documentation

## 2022-01-28 LAB — CBC WITH DIFFERENTIAL/PLATELET
Abs Immature Granulocytes: 0 10*3/uL (ref 0.00–0.07)
Basophils Absolute: 0 10*3/uL (ref 0.0–0.1)
Basophils Relative: 0 %
Eosinophils Absolute: 0 10*3/uL (ref 0.0–0.5)
Eosinophils Relative: 1 %
HCT: 40.2 % (ref 39.0–52.0)
Hemoglobin: 13.7 g/dL (ref 13.0–17.0)
Immature Granulocytes: 0 %
Lymphocytes Relative: 45 %
Lymphs Abs: 0.8 10*3/uL (ref 0.7–4.0)
MCH: 30.7 pg (ref 26.0–34.0)
MCHC: 34.1 g/dL (ref 30.0–36.0)
MCV: 90.1 fL (ref 80.0–100.0)
Monocytes Absolute: 0.2 10*3/uL (ref 0.1–1.0)
Monocytes Relative: 13 %
Neutro Abs: 0.8 10*3/uL — ABNORMAL LOW (ref 1.7–7.7)
Neutrophils Relative %: 41 %
Platelets: 160 10*3/uL (ref 150–400)
RBC: 4.46 MIL/uL (ref 4.22–5.81)
RDW: 13 % (ref 11.5–15.5)
WBC: 1.8 10*3/uL — ABNORMAL LOW (ref 4.0–10.5)
nRBC: 0 % (ref 0.0–0.2)

## 2022-01-28 MED ORDER — MIDAZOLAM HCL 2 MG/2ML IJ SOLN
INTRAMUSCULAR | Status: AC | PRN
  Administered 2022-01-28 (×2): 1 mg via INTRAVENOUS

## 2022-01-28 MED ORDER — SODIUM CHLORIDE 0.9 % IV SOLN: INTRAVENOUS | Status: DC

## 2022-01-28 MED ORDER — MIDAZOLAM HCL 2 MG/2ML IJ SOLN
INTRAMUSCULAR | Status: AC
  Filled 2022-01-28: qty 4

## 2022-01-28 MED ORDER — FENTANYL CITRATE (PF) 100 MCG/2ML IJ SOLN
INTRAMUSCULAR | Status: AC
  Filled 2022-01-28: qty 2

## 2022-01-28 MED ORDER — FENTANYL CITRATE (PF) 100 MCG/2ML IJ SOLN
INTRAMUSCULAR | Status: AC | PRN
  Administered 2022-01-28 (×2): 50 ug via INTRAVENOUS

## 2022-01-28 NOTE — Procedures (Signed)
Interventional Radiology Procedure Note ? ?Date of Procedure: 01/28/2022  ?Procedure: BMBx ? ?Findings:  ?1. CT BMBx right posterior ilium   ? ?Complications: No immediate complications noted.  ? ?Estimated Blood Loss: minimal ? ?Follow-up and Recommendations: ?1. Bedrest 1 hour  ? ? ?Albin Felling, MD  ?Vascular & Interventional Radiology  ?01/28/2022 11:44 AM ? ? ? ?

## 2022-01-28 NOTE — H&P (Signed)
Chief Complaint: Patient was seen in consultation today for bone marrow biopsy and aspiration at the request of [name]  Referring Physician(s): [name]  Supervising Physician: Pernell Dupre  Patient Status: [facility] - Out-pt  History of Present Illness: WHITTAKER Martinez is a 76 y.o. male with PMH of HTN, HLD, glaucoma, H. pylori, chronic leukopenia/neutropenia, smoldering myeloma, hypercalcemia related to hyperparathyroidism and IDA.  Patient had PET scan 01/13/2022 that resulted in 2 small hypermetabolic areas involving left eighth anterior rib and L2 vertebral body concerning for myelomatous lesions. Patient was referred by Dr. Candise Che for bone marrow biopsy and aspiration for evaluation of multiple myeloma.  Allergies: Patient has no allergy information on record.  Medications: Prior to Admission medications   Medication Sig Start Date End Date Taking? Authorizing Provider  iron polysaccharides (NIFEREX) 150 MG capsule Take 1 capsule (150 mg total) by mouth daily. 01/23/22   [name], MD  olmesartan (BENICAR) 20 MG tablet Take 1 tablet (20 mg total) by mouth daily. 12/18/21   [name], MD     History reviewed. No pertinent family history.  Social History   Socioeconomic History   Marital status: Married    Spouse name: Not on file   Number of children: Not on file   Years of education: Not on file   Highest education level: Not on file  Occupational History   Not on file  Tobacco Use   Smoking status: Not on file   Smokeless tobacco: Not on file  Substance and Sexual Activity   Alcohol use: Not on file   Drug use: Not on file   Sexual activity: Not on file  Other Topics Concern   Not on file  Social History Narrative   Not on file   Social Determinants of Health   Financial Resource Strain: Not on file  Food Insecurity: Not on file  Transportation Needs: Not on file  Physical Activity: Not on file  Stress: Not on file   Social Connections: Not on file    Review of Systems: A 12 point ROS discussed and pertinent positives are indicated in the HPI above.  All other systems are negative.  Review of Systems  Constitutional:  Negative for chills and fever.  HENT:  Negative for nosebleeds.   Eyes:  Negative for visual disturbance.  Respiratory:  Negative for cough and shortness of breath.   Cardiovascular:  Negative for chest pain and leg swelling.  Gastrointestinal:  Negative for abdominal pain, blood in stool, nausea and vomiting.  Genitourinary:  Negative for hematuria.  Neurological:  Negative for dizziness and headaches.   Vital Signs: BP (!) 148/81   Pulse (!) 58   Temp 98.4 F (36.9 C) (Oral)   Resp 19   Ht 6\' 1"  (1.854 m)   Wt 224 lb (101.6 kg)   BMI 29.55 kg/m   Physical Exam Constitutional:      Appearance: Normal appearance. He is not ill-appearing.  HENT:     Head: Normocephalic and atraumatic.     Mouth/Throat:     Mouth: Mucous membranes are dry.     Pharynx: Oropharynx is clear.  Eyes:     Extraocular Movements: Extraocular movements intact.     Pupils: Pupils are equal, round, and reactive to light.  Cardiovascular:     Rate and Rhythm: Regular rhythm. Bradycardia present.     Pulses: Normal pulses.     Heart sounds: Normal heart sounds.  Pulmonary:  Effort: Pulmonary effort is normal. No respiratory distress.     Breath sounds: Normal breath sounds. No stridor. No wheezing, rhonchi or rales.  Abdominal:     General: Bowel sounds are normal. There is no distension.     Palpations: Abdomen is soft.     Tenderness: There is no abdominal tenderness.  Musculoskeletal:     Right lower leg: No edema.     Left lower leg: No edema.  Skin:    General: Skin is warm and dry.  Neurological:     Mental Status: He is alert and oriented to person, place, and time.  Psychiatric:        Mood and Affect: Mood normal.        Behavior: Behavior normal.        Thought Content:  Thought content normal.        Judgment: Judgment normal.    Imaging: NM PET Image Restage (PS) Whole Body  Result Date: 01/13/2022 CLINICAL DATA:  Subsequent treatment strategy for history of smoldering myeloma with hypercalcemia. EXAM: NUCLEAR MEDICINE PET WHOLE BODY TECHNIQUE: 11.2 mCi F-18 FDG was injected intravenously. Full-ring PET imaging was performed from the head to foot after the radiotracer. CT data was obtained and used for attenuation correction and anatomic localization. Fasting blood glucose: 101 mg/dl COMPARISON:  None. FINDINGS: Mediastinal blood pool activity: SUV max 2.43 HEAD/NECK: No hypermetabolic activity in the scalp. No hypermetabolic cervical lymph nodes. Incidental CT findings: none CHEST: No hypermetabolic mediastinal or hilar nodes. No suspicious pulmonary nodules on the CT scan. Incidental CT findings: Scattered aortic and coronary artery calcifications. ABDOMEN/PELVIS: No abnormal hypermetabolic activity within the liver, pancreas, adrenal glands, or spleen. No hypermetabolic lymph nodes in the abdomen or pelvis. Incidental CT findings: Scattered aortic calcifications but no aneurysm. Moderate prostate gland enlargement. SKELETON: There is a hypermetabolic focus associated with the anterior left eighth rib. SUV max is 6.41. No obvious CT abnormality is identified. No findings to suggest this is a healing fracture. There is a second hypermetabolic focus involving the L2 vertebral body anteriorly on the right side. SUV max is 6.14. This is associated with a small lytic lesion. I do see any other hypermetabolic osseous foci. I do not see any other lytic myelomatous lesions on the CT scan involving the axial or appendicular skeleton. Incidental CT findings: None. EXTREMITIES: No abnormal hypermetabolic activity in the lower extremities. Incidental CT findings: Right total knee arthroplasty noted. IMPRESSION: 1. Two small hypermetabolic foci involving the left eighth anterior rib  and the L2 vertebral body as detailed above. These could be small myelomatous lesions. No other osseous abnormalities are identified. 2. No other significant findings are identified. Scattered vascular calcifications. Mild to moderate prostate gland enlargement. Electronically Signed   By: Rudie Meyer M.D.   On: 01/13/2022 13:09    Labs:  CBC: Recent Labs    12/12/21 1225 01/10/22 1032 01/28/22 0927  WBC 1.9* 1.7* 1.8*  HGB 13.4 12.9* 13.7  HCT 39.6 38.6* 40.2  PLT 182 165 160    COAGS: No results for input(s): INR, APTT in the last 8760 hours.  BMP: Recent Labs    12/12/21 1225 01/10/22 1032  NA 137 137  K 4.1 4.5  CL 103 105  CO2 28 29  GLUCOSE 99 103*  BUN 22 17  CALCIUM 10.7*  10.7* 10.2  CREATININE 1.10 1.14  GFRNONAA >60 >60    LIVER FUNCTION TESTS: Recent Labs    12/12/21 1225 01/10/22 1032  BILITOT  0.6 0.6  AST 27 27  ALT 26 22  ALKPHOS 37* 36*  PROT 7.7 7.2  ALBUMIN 4.2 4.1    TUMOR MARKERS: No results for input(s): AFPTM, CEA, CA199, CHROMGRNA in the last 8760 hours.  Assessment and Plan: History of HTN, HLD, glaucoma, H. pylori, chronic leukopenia/neutropenia, smoldering myeloma, hypercalcemia related to hyperparathyroidism and IDA.  Patient had PET scan 01/13/2022 that resulted in 2 small hypermetabolic areas involving left eighth anterior rib and L2 vertebral body concerning for myelomatous lesions. Patient was referred by Dr. Candise Che for bone marrow biopsy and aspiration for evaluation of multiple myeloma.   Pt resting on stretcher. He is A&O, calm and pleasant.  He is in no distress.  Pt states he is NPO per order.  No thinners noted.  Today's labs pending.   Risks and benefits of bone marrow biopsy and aspiration was discussed with the patient and/or patient's family including, but not limited to bleeding, infection, damage to adjacent structures or low yield requiring additional tests.  All of the questions were answered and there is  agreement to proceed.  Consent signed and in chart.   Thank you for this interesting consult.  I greatly enjoyed meeting ALEXEI TRIGGER and look forward to participating in their care.  A copy of this report was sent to the requesting provider on this date.  Electronically Signed: Shon Hough, NP 01/28/2022, 10:16 AM   I spent a total of 20 minutes in face to face in clinical consultation, greater than 50% of which was counseling/coordinating care for bone marrow biopsy and aspiration.

## 2022-01-28 NOTE — Discharge Instructions (Signed)

## 2022-02-05 ENCOUNTER — Inpatient Hospital Stay: Payer: Medicare Other

## 2022-02-06 ENCOUNTER — Encounter: Payer: Self-pay | Admitting: Family Medicine

## 2022-02-06 ENCOUNTER — Ambulatory Visit (INDEPENDENT_AMBULATORY_CARE_PROVIDER_SITE_OTHER): Payer: Medicare Other | Admitting: Family Medicine

## 2022-02-06 VITALS — BP 138/86 | HR 65 | Temp 97.9°F | Resp 17 | Ht 73.0 in | Wt 230.0 lb

## 2022-02-06 DIAGNOSIS — H409 Unspecified glaucoma: Secondary | ICD-10-CM

## 2022-02-06 DIAGNOSIS — I10 Essential (primary) hypertension: Secondary | ICD-10-CM | POA: Diagnosis not present

## 2022-02-06 DIAGNOSIS — N529 Male erectile dysfunction, unspecified: Secondary | ICD-10-CM

## 2022-02-06 DIAGNOSIS — C9000 Multiple myeloma not having achieved remission: Secondary | ICD-10-CM | POA: Diagnosis not present

## 2022-02-06 DIAGNOSIS — N401 Enlarged prostate with lower urinary tract symptoms: Secondary | ICD-10-CM | POA: Diagnosis not present

## 2022-02-06 DIAGNOSIS — R739 Hyperglycemia, unspecified: Secondary | ICD-10-CM | POA: Diagnosis not present

## 2022-02-06 DIAGNOSIS — E785 Hyperlipidemia, unspecified: Secondary | ICD-10-CM

## 2022-02-06 LAB — LIPID PANEL
Cholesterol: 165 mg/dL (ref 0–200)
HDL: 50.3 mg/dL (ref >39.00)
LDL Cholesterol: 100 mg/dL — ABNORMAL HIGH (ref 0–99)
NonHDL: 114.89
Total CHOL/HDL Ratio: 3
Triglycerides: 76 mg/dL (ref 0.0–149.0)
VLDL: 15.2 mg/dL (ref 0.0–40.0)

## 2022-02-06 LAB — PSA: PSA: 4.39 ng/mL — ABNORMAL HIGH (ref 0.10–4.00)

## 2022-02-06 LAB — HEMOGLOBIN A1C: Hgb A1c MFr Bld: 6.1 % (ref 4.6–6.5)

## 2022-02-06 MED ORDER — OLMESARTAN MEDOXOMIL 20 MG PO TABS: 20.0000 mg | ORAL_TABLET | Freq: Every day | ORAL | 1 refills | Status: DC

## 2022-02-06 MED ORDER — ATORVASTATIN CALCIUM 20 MG PO TABS: 20.0000 mg | ORAL_TABLET | Freq: Every day | ORAL | 1 refills | Status: DC

## 2022-02-06 NOTE — Patient Instructions (Addendum)
Nice meeting you today.  ?Keep follow up with dentist as planned - it appears hematologist wanted clearance for use of Zometa.  ?Keep a record of your blood pressures outside of the office and bring them to the next office visit. ?If running higher let me know and we can adjust meds.  ?I will check labs including PSA, may need to meet with urology. I will let you know. If any new or worsening urinary symptoms be seen.  ?I will refer you to ophthalmology.  ?Return to the clinic or go to the nearest emergency room if any of your symptoms worsen or new symptoms occur. ? ? ?Managing Your Hypertension ?Hypertension, also called high blood pressure, is when the force of the blood pressing against the walls of the arteries is too strong. Arteries are blood vessels that carry blood from your heart throughout your body. Hypertension forces the heart to work harder to pump blood and may cause the arteries to become narrow or stiff. ?Understanding blood pressure readings ?Your personal target blood pressure may vary depending on your medical conditions, your age, and other factors. A blood pressure reading includes a higher number over a lower number. Ideally, your blood pressure should be below 120/80. You should know that: ?The first, or top, number is called the systolic pressure. It is a measure of the pressure in your arteries as your heart beats. ?The second, or bottom number, is called the diastolic pressure. It is a measure of the pressure in your arteries as the heart relaxes. ?Blood pressure is classified into four stages. Based on your blood pressure reading, your health care provider may use the following stages to determine what type of treatment you need, if any. Systolic pressure and diastolic pressure are measured in a unit called mmHg. ?Normal ?Systolic pressure: below 315. ?Diastolic pressure: below 80. ?Elevated ?Systolic pressure: 176-160. ?Diastolic pressure: below 80. ?Hypertension stage 1 ?Systolic  pressure: 737-106. ?Diastolic pressure: 26-94. ?Hypertension stage 2 ?Systolic pressure: 854 or above. ?Diastolic pressure: 90 or above. ?How can this condition affect me? ?Managing your hypertension is an important responsibility. Over time, hypertension can damage the arteries and decrease blood flow to important parts of the body, including the brain, heart, and kidneys. Having untreated or uncontrolled hypertension can lead to: ?A heart attack. ?A stroke. ?A weakened blood vessel (aneurysm). ?Heart failure. ?Kidney damage. ?Eye damage. ?Metabolic syndrome. ?Memory and concentration problems. ?Vascular dementia. ?What actions can I take to manage this condition? ?Hypertension can be managed by making lifestyle changes and possibly by taking medicines. Your health care provider will help you make a plan to bring your blood pressure within a normal range. ?Nutrition ? ?Eat a diet that is high in fiber and potassium, and low in salt (sodium), added sugar, and fat. An example eating plan is called the Dietary Approaches to Stop Hypertension (DASH) diet. To eat this way: ?Eat plenty of fresh fruits and vegetables. Try to fill one-half of your plate at each meal with fruits and vegetables. ?Eat whole grains, such as whole-wheat pasta, brown rice, or whole-grain bread. Fill about one-fourth of your plate with whole grains. ?Eat low-fat dairy products. ?Avoid fatty cuts of meat, processed or cured meats, and poultry with skin. Fill about one-fourth of your plate with lean proteins such as fish, chicken without skin, beans, eggs, and tofu. ?Avoid pre-made and processed foods. These tend to be higher in sodium, added sugar, and fat. ?Reduce your daily sodium intake. Most people with hypertension should eat  less than 1,500 mg of sodium a day. ?Lifestyle ? ?Work with your health care provider to maintain a healthy body weight or to lose weight. Ask what an ideal weight is for you. ?Get at least 30 minutes of exercise that  causes your heart to beat faster (aerobic exercise) most days of the week. Activities may include walking, swimming, or biking. ?Include exercise to strengthen your muscles (resistance exercise), such as weight lifting, as part of your weekly exercise routine. Try to do these types of exercises for 30 minutes at least 3 days a week. ?Do not use any products that contain nicotine or tobacco, such as cigarettes, e-cigarettes, and chewing tobacco. If you need help quitting, ask your health care provider. ?Control any long-term (chronic) conditions you have, such as high cholesterol or diabetes. ?Identify your sources of stress and find ways to manage stress. This may include meditation, deep breathing, or making time for fun activities. ?Alcohol use ?Do not drink alcohol if: ?Your health care provider tells you not to drink. ?You are pregnant, may be pregnant, or are planning to become pregnant. ?If you drink alcohol: ?Limit how much you use to: ?0-1 drink a day for women. ?0-2 drinks a day for men. ?Be aware of how much alcohol is in your drink. In the [place] one drink equals one 12 oz bottle of beer (355 mL), one 5 oz glass of wine (148 mL), or one 1? oz glass of hard liquor (44 mL). ?Medicines ?Your health care provider may prescribe medicine if lifestyle changes are not enough to get your blood pressure under control and if: ?Your systolic blood pressure is 130 or higher. ?Your diastolic blood pressure is 80 or higher. ?Take medicines only as told by your health care provider. Follow the directions carefully. Blood pressure medicines must be taken as told by your health care provider. The medicine does not work as well when you skip doses. Skipping doses also puts you at risk for problems. ?Monitoring ?Before you monitor your blood pressure: ?Do not smoke, drink caffeinated beverages, or exercise within 30 minutes before taking a measurement. ?Use the bathroom and empty your bladder (urinate). ?Sit quietly for at  least 5 minutes before taking measurements. ?Monitor your blood pressure at home as told by your health care provider. To do this: ?Sit with your back straight and supported. ?Place your feet flat on the floor. Do not cross your legs. ?Support your arm on a flat surface, such as a table. Make sure your upper arm is at heart level. ?Each time you measure, take two or three readings one minute apart and record the results. ?You may also need to have your blood pressure checked regularly by your health care provider. ?General information ?Talk with your health care provider about your diet, exercise habits, and other lifestyle factors that may be contributing to hypertension. ?Review all the medicines you take with your health care provider because there may be side effects or interactions. ?Keep all visits as told by your health care provider. Your health care provider can help you create and adjust your plan for managing your high blood pressure. ?Where to find more information ?National Heart, Lung, and Blood Institute: https://wilson-eaton.com/ ?American Heart Association: www.heart.org ?Contact a health care provider if: ?You think you are having a reaction to medicines you have taken. ?You have repeated (recurrent) headaches. ?You feel dizzy. ?You have swelling in your ankles. ?You have trouble with your vision. ?Get help right away if: ?You develop a severe  headache or confusion. ?You have unusual weakness or numbness, or you feel faint. ?You have severe pain in your chest or abdomen. ?You vomit repeatedly. ?You have trouble breathing. ?These symptoms may represent a serious problem that is an emergency. Do not wait to see if the symptoms will go away. Get medical help right away. Call your local emergency services (911 in the [place] Do not drive yourself to the hospital. ?Summary ?Hypertension is when the force of blood pumping through your arteries is too strong. If this condition is not controlled, it may put you at  risk for serious complications. ?Your personal target blood pressure may vary depending on your medical conditions, your age, and other factors. For most people, a normal blood pressure is less than 120/80. ?H

## 2022-02-06 NOTE — Progress Notes (Signed)
? ?Subjective:  ?Patient ID: Nathan Martinez, male    DOB: Dec 28, 1945  Age: 76 y.o. MRN: [id number] ? ?CC:  ?Chief Complaint  ?Patient presents with  ? Establish Care  ?  Pt here to establish care, needs refill on BP meds   ? ? ?HPI ?Nathan Martinez presents for  ? ?New patient to establish care, moved from [place] in October. Prior PCP in [place]. 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  ? ?Hypertension: ?Treated with telmisartan 20 mg daily. ?Home readings: 140/70-80. Usually 130's  ?BP Readings from Last 3 Encounters:  ?02/06/22 (!) 144/80  ?01/28/22 127/75  ?01/17/22 139/79  ? ?Lab Results  ?Component Value Date  ? CREATININE 1.14 01/10/2022  ? ?Hyperlipidemia: ?Treated with Lipitor 20 mg daily. Fasting today.  ?Last checked with prior PCP.  ?No results found for: CHOL, HDL, LDLCALC, LDLDIRECT, TRIG, CHOLHDL ?Lab Results  ?Component Value Date  ? ALT 22 01/10/2022  ? AST 27 01/10/2022  ? ALKPHOS 36 (L) 01/10/2022  ? BILITOT 0.6 01/10/2022  ? ?GERD with history of H. pylori infection ?No recent need for meds, no heartburn. Resolved after meds.  ? ?Glaucoma: ?Wears glasses, uses eye gtts. Has not setup optho eval yet. Some HA with glasses at times.  ? ?Hyperglycemia ?Glucose 103 on 01/10/22 labs.  ? ?Multiple myeloma ?Followed by Dr. Irene Limbo.  Last visit 01/17/2022.  Monoclonal paraproteinemia since 2017,  plasma cell dyscrasia.  History of smoldering myeloma.  PET scan 01/10/2022 with 2 small hypermetabolic foci involving the anterior eighth rib and L2 vertebral body.  Concerning for myelomatous lesions.  Concern for possible progression to active myeloma.  Plan for Velcade, Revlimid, dexamethasone.  If bone marrow shows cytogenetics plan to switch Velcade to carfilzomib, bone marrow biopsy planned.  We will also need to have dental clearance since he will need to be on Zometa every 4 weeks. Will be starting on the 31st. Has dentist - has not seen recently. Has appointment 03/13/22.  ?On niferex.   ? ?Primary hyperparathyroidism  ?status post parathyroidectomy in 2019 for possible cause of elevated CA.  Intermittent hypercalcemia, previously attributed to hyperparathyroidism, recently attributed to HCTZ use.  ?Lab Results  ?Component Value Date  ? CALCIUM 10.2 01/10/2022  ? ?Erectile dysfunction: ?Has taken infrequent Cialis - effective  ?No hearing/vision changes, no CP with exertion, HA or flushing.  ? ?BPH: ?Followed by urology in [place], 2 biopsies that were negative - back in 2011. ? Elevated PSA in past. Moderate prostate gland enlargement on PET scan recently.  ?Nocturia 1-2 per night.  ?Some episodes of urgency at times.  ? ?History ?Patient Active Problem List  ? Diagnosis Date Noted  ? Multiple myeloma ([place]) 01/24/2022  ? Counseling regarding advance care planning and goals of care 01/24/2022  ? ?Past Medical History:  ?Diagnosis Date  ? Hypertension   ? ?Past Surgical History:  ?Procedure Laterality Date  ? KNEE SURGERY Right 2018  ? PARATHYROID EXPLORATION    ? ?No Known Allergies ?Prior to Admission medications   ?Medication Sig Start Date End Date Taking? Authorizing Provider  ?aspirin 81 MG chewable tablet Chew by mouth. 09/30/16  Yes [provider]  ?atorvastatin (LIPITOR) 20 MG tablet Take 20 mg by mouth at bedtime. 01/22/22  Yes [provider]  ?ergocalciferol (VITAMIN D2) 1.25 MG (50000 UT) capsule Take 1 capsule by mouth daily. 02/13/13  Yes [provider]  ?iron polysaccharides (NIFEREX) 150 MG capsule Take 1 capsule (150 mg  total) by mouth daily. 01/23/22  Yes [name], MD  ?Astrid Drafts (OMEGA-3) 500 MG CAPS  09/30/16  Yes [provider]  ?latanoprost (XALATAN) 0.005 % ophthalmic solution SMARTSIG:1 Drop(s) In Eye(s) Every Evening 01/28/22  Yes [provider]  ?Magnesium Oxide 400 MG CAPS    Yes [provider]  ?Multiple Vitamins-Minerals (MULTIVITAMIN MEN 50+) TABS Take by mouth.   Yes [provider]  ?olmesartan  (BENICAR) 20 MG tablet Take 1 tablet (20 mg total) by mouth daily. 12/18/21  Yes [name], MD  ?tadalafil (CIALIS) 5 MG tablet  02/01/15  Yes [provider]  ? ?Social History  ? ?Socioeconomic History  ? Marital status: Married  ?  Spouse name: Not on file  ? Number of children: Not on file  ? Years of education: Not on file  ? Highest education level: Not on file  ?Occupational History  ? Not on file  ?Tobacco Use  ? Smoking status: Never  ? Smokeless tobacco: Never  ?Substance and Sexual Activity  ? Alcohol use: Not Currently  ?  Comment: rare occasions  ? Drug use: Never  ? Sexual activity: Not Currently  ?Other Topics Concern  ? Not on file  ?Social History Narrative  ? Not on file  ? ?Social Determinants of Health  ? ?Financial Resource Strain: Not on file  ?Food Insecurity: Not on file  ?Transportation Needs: Not on file  ?Physical Activity: Not on file  ?Stress: Not on file  ?Social Connections: Not on file  ?Intimate Partner Violence: Not on file  ? ? ?Review of Systems  ?Constitutional:  Negative for fatigue and unexpected weight change.  ?Eyes:  Negative for visual disturbance.  ?Respiratory:  Negative for cough, chest tightness and shortness of breath.   ?Cardiovascular:  Negative for chest pain, palpitations and leg swelling.  ?Gastrointestinal:  Negative for abdominal pain and blood in stool.  ?Neurological:  Negative for dizziness, light-headedness and headaches.  ? ? ?Objective:  ? ?Vitals:  ? 02/06/22 0910  ?BP: (!) 144/80  ?Pulse: 65  ?Resp: 17  ?Temp: 97.9 ?F (36.6 ?C)  ?TempSrc: Temporal  ?SpO2: 97%  ?Weight: 230 lb (104.3 kg)  ?Height: 6' 1" (1.854 m)  ? ? ? ?Physical Exam ?Vitals reviewed.  ?Constitutional:   ?   Appearance: He is well-developed.  ?HENT:  ?   Head: Normocephalic and atraumatic.  ?Neck:  ?   Vascular: No carotid bruit or JVD.  ?Cardiovascular:  ?   Rate and Rhythm: Normal rate and regular rhythm.  ?   Heart sounds: Normal heart sounds. No murmur  heard. ?Pulmonary:  ?   Effort: Pulmonary effort is normal.  ?   Breath sounds: Normal breath sounds. No rales.  ?Musculoskeletal:  ?   Right lower leg: No edema.  ?   Left lower leg: No edema.  ?Skin: ?   General: Skin is warm and dry.  ?Neurological:  ?   Mental Status: He is alert and oriented to person, place, and time.  ?Psychiatric:     ?   Mood and Affect: Mood normal.  ? ? ? ? ? ?Assessment & Plan:  ?Nathan Martinez is a 76 y.o. male . ?Multiple myeloma not having achieved remission ([place]) ?-Plan as above with ongoing follow-up with hematology, now plan for treatment.  Dental follow-up for clearance given med/treatment plan as above. ? ?Essential hypertension - Plan: olmesartan (BENICAR) 20 MG tablet ?Mild elevation in office, home monitoring with  RTC precautions given. ? ?Hypercalcemia ? -Parathyroid versus medication cause, most recent testing improved. ? ?Hyperlipidemia, unspecified hyperlipidemia type - Plan: Lipid panel, atorvastatin (LIPITOR) 20 MG tablet ?Tolerating Lipitor, continue same. ? ?Glaucoma of both eyes, unspecified glaucoma type - Plan: Ambulatory referral to Ophthalmology ?Refer to ophthalmology for ongoing care with history of glaucoma.  Continue drops. ? ?Hyperglycemia - Plan: Hemoglobin A1c ? -Slight hyperglycemia on outside labs as above, check A1c. ? ?Benign prostatic hyperplasia with lower urinary tract symptoms, symptom details unspecified - Plan: PSA ?Previous biopsies above that were reassuring, prior elevated PSA.  Overall stable symptoms.  Check PSA, consider local urologist follow-up. ? ?Erectile dysfunction, unspecified erectile dysfunction type ?Stable with use of Cialis, potential side effects and risks were discussed with RTC precautions given. ? ?Meds ordered this encounter  ?Medications  ? olmesartan (BENICAR) 20 MG tablet  ?  Sig: Take 1 tablet (20 mg total) by mouth daily.  ?  Dispense:  90 tablet  ?  Refill:  1  ? atorvastatin (LIPITOR) 20 MG tablet  ?  Sig: Take 1  tablet (20 mg total) by mouth at bedtime.  ?  Dispense:  90 tablet  ?  Refill:  1  ? ?Patient Instructions  ?Nice meeting you today.  ?Keep follow up with dentist as planned - it appears hematologist wanted clearance fo

## 2022-02-07 ENCOUNTER — Encounter (HOSPITAL_COMMUNITY): Payer: Self-pay | Admitting: Hematology

## 2022-02-12 ENCOUNTER — Telehealth: Payer: Self-pay

## 2022-02-12 ENCOUNTER — Encounter: Payer: Self-pay | Admitting: Family Medicine

## 2022-02-12 DIAGNOSIS — R972 Elevated prostate specific antigen [PSA]: Secondary | ICD-10-CM

## 2022-02-12 NOTE — Telephone Encounter (Signed)
Noted  

## 2022-02-12 NOTE — Telephone Encounter (Signed)
Pt got message stating he needs Pneumonia, Tetanus can we make appt for him do you have record this up accurate  ?

## 2022-02-14 ENCOUNTER — Inpatient Hospital Stay: Payer: Medicare Other | Attending: Hematology

## 2022-02-14 ENCOUNTER — Other Ambulatory Visit: Payer: Self-pay | Admitting: Hematology

## 2022-02-14 ENCOUNTER — Ambulatory Visit
Admission: EM | Admit: 2022-02-14 | Discharge: 2022-02-14 | Disposition: A | Discharge status: Home / Self Care | Payer: Medicare Other

## 2022-02-14 ENCOUNTER — Other Ambulatory Visit: Payer: Self-pay

## 2022-02-14 DIAGNOSIS — Z5112 Encounter for antineoplastic immunotherapy: Secondary | ICD-10-CM | POA: Insufficient documentation

## 2022-02-14 DIAGNOSIS — J301 Allergic rhinitis due to pollen: Secondary | ICD-10-CM

## 2022-02-14 DIAGNOSIS — Z7189 Other specified counseling: Secondary | ICD-10-CM

## 2022-02-14 DIAGNOSIS — C9000 Multiple myeloma not having achieved remission: Secondary | ICD-10-CM | POA: Insufficient documentation

## 2022-02-14 MED ORDER — LENALIDOMIDE 15 MG PO CAPS: 15.0000 mg | ORAL_CAPSULE | Freq: Every day | ORAL | 0 refills | Status: DC

## 2022-02-14 MED ORDER — ACYCLOVIR 400 MG PO TABS: 400.0000 mg | ORAL_TABLET | Freq: Two times a day (BID) | ORAL | 3 refills | Status: DC

## 2022-02-14 MED ORDER — PROCHLORPERAZINE MALEATE 10 MG PO TABS: 10.0000 mg | ORAL_TABLET | Freq: Four times a day (QID) | ORAL | 1 refills | Status: DC | PRN

## 2022-02-14 MED ORDER — LORAZEPAM 0.5 MG PO TABS: 0.5000 mg | ORAL_TABLET | Freq: Four times a day (QID) | ORAL | 0 refills | Status: DC | PRN

## 2022-02-14 MED ORDER — ONDANSETRON HCL 8 MG PO TABS: 8.0000 mg | ORAL_TABLET | Freq: Two times a day (BID) | ORAL | 1 refills | Status: DC | PRN

## 2022-02-14 NOTE — ED Provider Notes (Signed)
?[facility] ? ? ? ?CSN: [id number] ?Arrival date & time: 02/14/22  6754 ? ? ?  ? ?History   ?Chief Complaint ?Chief Complaint  ?Patient presents with  ? Sore Throat  ?  Congestion and sore throat ?  ? ? ?HPI ?Nathan Martinez is a 76 y.o. male presenting with sore throat and congestion for 1 day following working outside and then playing golf outside.  Current diagnosis of multiple myeloma, and so he wishes to rule out sinusitis and COVID.  Describes mild sore throat, nasal congestion, sneezing.  Has not attempted any medications at home.  Denies fever/chills, shortness of breath. ? ?HPI ? ?Past Medical History:  ?Diagnosis Date  ? Hypertension   ? ? ?Patient Active Problem List  ? Diagnosis Date Noted  ? Multiple myeloma ([place]) 01/24/2022  ? Counseling regarding advance care planning and goals of care 01/24/2022  ? ? ?Past Surgical History:  ?Procedure Laterality Date  ? KNEE SURGERY Right 2018  ? PARATHYROID EXPLORATION    ? ? ? ? ? ?Home Medications   ? ?Prior to Admission medications   ?Medication Sig Start Date End Date Taking? Authorizing Provider  ?acyclovir (ZOVIRAX) 400 MG tablet Take 1 tablet (400 mg total) by mouth 2 (two) times daily. 02/14/22   [name], MD  ?aspirin 81 MG chewable tablet Chew by mouth. 09/30/16   [provider]  ?atorvastatin (LIPITOR) 20 MG tablet Take 1 tablet (20 mg total) by mouth at bedtime. 02/06/22   [name], MD  ?ergocalciferol (VITAMIN D2) 1.25 MG (50000 UT) capsule Take 1 capsule by mouth daily. 02/13/13   [provider]  ?iron polysaccharides (NIFEREX) 150 MG capsule Take 1 capsule (150 mg total) by mouth daily. 01/23/22   [name], MD  ?Astrid Drafts (OMEGA-3) [place]  09/30/16   [provider]  ?latanoprost (XALATAN) 0.005 % ophthalmic solution SMARTSIG:1 Drop(s) In Eye(s) Every Evening 01/28/22   [provider]  ?lenalidomide (REVLIMID) 15 MG capsule Take 1 capsule (15 mg total) by mouth daily. Take  14 days on, 7 days off, repeat every 21 days. 02/14/22   [name], MD  ?LORazepam (ATIVAN) 0.5 MG tablet Take 1 tablet (0.5 mg total) by mouth every 6 (six) hours as needed (Nausea or vomiting). 02/14/22   [name], MD  ?Magnesium Oxide 400 MG CAPS     [provider]  ?Multiple Vitamins-Minerals (MULTIVITAMIN MEN 50+) TABS Take by mouth.    [provider]  ?olmesartan (BENICAR) 20 MG tablet Take 1 tablet (20 mg total) by mouth daily. 02/06/22   [name], MD  ?ondansetron (ZOFRAN) 8 MG tablet Take 1 tablet (8 mg total) by mouth 2 (two) times daily as needed (Nausea or vomiting). 02/14/22   [name], MD  ?prochlorperazine (COMPAZINE) 10 MG tablet Take 1 tablet (10 mg total) by mouth every 6 (six) hours as needed (Nausea or vomiting). 02/14/22   [name], MD  ?tadalafil (CIALIS) 5 MG tablet  02/01/15   [provider]  ? ? ?Family History ?No family history on file. ? ?Social History ?Social History  ? ?Tobacco Use  ? Smoking status: Never  ? Smokeless tobacco: Never  ?Vaping Use  ? Vaping Use: Never used  ?Substance Use Topics  ? Alcohol use: Not Currently  ? Drug use: Never  ? ? ? ?Allergies   ?Patient has no known allergies. ? ? ?Review of Systems ?Review  of Systems  ?Constitutional:  Negative for appetite change, chills and fever.  ?HENT:  Positive for congestion. Negative for ear pain, rhinorrhea, sinus pressure, sinus pain and sore throat.   ?Eyes:  Negative for redness and visual disturbance.  ?Respiratory:  Positive for cough. Negative for chest tightness, shortness of breath and wheezing.   ?Cardiovascular:  Negative for chest pain and palpitations.  ?Gastrointestinal:  Negative for abdominal pain, constipation, diarrhea, nausea and vomiting.  ?Genitourinary:  Negative for dysuria, frequency and urgency.  ?Musculoskeletal:  Negative for myalgias.  ?Neurological:  Negative for dizziness, weakness and headaches.   ?Psychiatric/Behavioral:  Negative for confusion.   ?All other systems reviewed and are negative. ? ? ?Physical Exam ?Triage Vital Signs ?ED Triage Vitals  ?Enc Vitals Group  ?   BP 02/14/22 1047 (!) 163/88  ?   Pulse Rate 02/14/22 1045 75  ?   Resp 02/14/22 1045 18  ?   Temp 02/14/22 1045 98.2 ?F (36.8 ?C)  ?   Temp Source 02/14/22 1045 Oral  ?   SpO2 02/14/22 1045 98 %  ?   Weight --   ?   Height --   ?   Head Circumference --   ?   Peak Flow --   ?   Pain Score 02/14/22 1045 0  ?   Pain Loc --   ?   Pain Edu? --   ?   Excl. in [place]? --   ? ?No data found. ? ?Updated Vital Signs ?BP (!) 163/88 (BP Location: Right Arm)   Pulse 75   Temp 98.2 ?F (36.8 ?C) (Oral)   Resp 18   SpO2 98%  ? ?Visual Acuity ?Right Eye Distance:   ?Left Eye Distance:   ?Bilateral Distance:   ? ?Right Eye Near:   ?Left Eye Near:    ?Bilateral Near:    ? ?Physical Exam ?Vitals reviewed.  ?Constitutional:   ?   General: He is not in acute distress. ?   Appearance: Normal appearance. He is not ill-appearing.  ?HENT:  ?   Head: Normocephalic and atraumatic.  ?   Right Ear: Tympanic membrane, ear canal and external ear normal. No tenderness. No middle ear effusion. There is no impacted cerumen. Tympanic membrane is not perforated, erythematous, retracted or bulging.  ?   Left Ear: Tympanic membrane, ear canal and external ear normal. No tenderness.  No middle ear effusion. There is no impacted cerumen. Tympanic membrane is not perforated, erythematous, retracted or bulging.  ?   Nose: Nose normal. No congestion.  ?   Mouth/Throat:  ?   Mouth: Mucous membranes are moist.  ?   Pharynx: Uvula midline. No oropharyngeal exudate or posterior oropharyngeal erythema.  ?Eyes:  ?   Extraocular Movements: Extraocular movements intact.  ?   Pupils: Pupils are equal, round, and reactive to light.  ?Cardiovascular:  ?   Rate and Rhythm: Normal rate and regular rhythm.  ?   Heart sounds: Normal heart sounds.  ?Pulmonary:  ?   Effort: Pulmonary effort is  normal.  ?   Breath sounds: Normal breath sounds. No decreased breath sounds, wheezing, rhonchi or rales.  ?Abdominal:  ?   Palpations: Abdomen is soft.  ?   Tenderness: There is no abdominal tenderness. There is no guarding or rebound.  ?Lymphadenopathy:  ?   Cervical: No cervical adenopathy.  ?   Right cervical: No superficial cervical adenopathy. ?   Left cervical: No superficial cervical adenopathy.  ?Neurological:  ?  General: No focal deficit present.  ?   Mental Status: He is alert and oriented to person, place, and time.  ?Psychiatric:     ?   Mood and Affect: Mood normal.     ?   Behavior: Behavior normal.     ?   Thought Content: Thought content normal.     ?   Judgment: Judgment normal.  ? ? ? ?UC Treatments / Results  ?Labs ?(all labs ordered are listed, but only abnormal results are displayed) ?Labs Reviewed - No data to display ? ?EKG ? ? ?Radiology ?No results found. ? ?Procedures ?Procedures (including critical care time) ? ?Medications Ordered in UC ?Medications - No data to display ? ?Initial Impression / Assessment and Plan / UC Course  ?I have reviewed the triage vital signs and the nursing notes. ? ?Pertinent labs & imaging results that were available during my care of the patient were reviewed by me and considered in my medical decision making (see chart for details). ? ?  ? ?This patient is a very pleasant 76 y.o. year old male presenting with allergic rhinitis following spending time outside. Afebrile, nontachy. Clinically well appearing. Hasn't attempted any medications yet; trial of Allegra and nasacort. If symptoms persist, I did sent Symbicort for him to try. He plans to take home covid test and declines further testing today. ED return precautions discussed. Patient verbalizes understanding and agreement.  ?.  ? ?Final Clinical Impressions(s) / UC Diagnoses  ? ?Final diagnoses:  ?Seasonal allergic rhinitis due to pollen  ? ? ? ?Discharge Instructions   ? ?  ?-Pick up some Allegra and  Nasacort over the counter. Try this daily for at least 7 days. Continue for longer if it's helping.  ?-If no relief on the above regimen, start the Singulair. This is a prescription-strength once daily allergy medication.  ?-You can

## 2022-02-14 NOTE — ED Triage Notes (Signed)
Pt states he has had a sore throat and some congestion since yesterday ? ?Pt states he was outside yesterday working with [facility] and that's what he think is wrong ? ?Denies Meds  ?

## 2022-02-14 NOTE — Progress Notes (Signed)
Pharmacist Chemotherapy Monitoring - Initial Assessment   ? ?Anticipated start date: 02/21/22  ? ?The following has been reviewed per standard work regarding the patient's treatment regimen: ?The patient's diagnosis, treatment plan and drug doses, and organ/hematologic function ?Lab orders and baseline tests specific to treatment regimen  ?The treatment plan start date, drug sequencing, and pre-medications ?Prior authorization status  ?Patient's documented medication list, including drug-drug interaction screen and prescriptions for anti-emetics and supportive care specific to the treatment regimen ?The drug concentrations, fluid compatibility, administration routes, and timing of the medications to be used ?The patient's access for treatment and lifetime cumulative dose history, if applicable  ?The patient's medication allergies and previous infusion related reactions, if applicable  ? ?Changes made to treatment plan:  ?treatment plan date ? ?Follow up needed:  ?Pending authorization for treatment  - Medicare ? ? ?Kennith Center, Pharm.D., CPP ?02/14/2022'@4'$ :46 PM ? ? ? ?

## 2022-02-14 NOTE — Discharge Instructions (Addendum)
-  Pick up some Allegra and Nasacort over the counter. Try this daily for at least 7 days. Continue for longer if it's helping.  ?-If no relief on the above regimen, start the Singulair. This is a prescription-strength once daily allergy medication.  ?-You can take a home covid test to rule out covid-19 ?-Follow-up if new symptoms like facial pain, new fevers, etc.  ?

## 2022-02-14 NOTE — Progress Notes (Signed)
ON PATHWAY REGIMEN - Multiple Myeloma and Other Plasma Cell Dyscrasias ? ?No Change  Continue With Treatment as Ordered. ? ?Original Decision Date/Time: 01/24/2022 00:05 ? ? ?  A cycle is every 21 days: ?    Bortezomib  ?    Lenalidomide  ?    Dexamethasone  ? ?**Always confirm dose/schedule in your pharmacy ordering system** ? ?Patient Characteristics: ?Multiple Myeloma, Newly Diagnosed, Transplant Eligible, Unknown or Awaiting Test Results ?Disease Classification: Multiple Myeloma ?R-ISS Staging: Unknown ?Therapeutic Status: Newly Diagnosed ?Is Patient Eligible for Transplant<= Transplant Eligible ?Risk Status: Awaiting Test Results ?Intent of Therapy: ?Non-Curative / Palliative Intent, Discussed with Patient ?

## 2022-02-17 ENCOUNTER — Telehealth: Payer: Self-pay | Admitting: Pharmacist

## 2022-02-17 ENCOUNTER — Telehealth: Payer: Self-pay

## 2022-02-17 ENCOUNTER — Encounter: Payer: Self-pay | Admitting: Hematology

## 2022-02-17 ENCOUNTER — Other Ambulatory Visit (HOSPITAL_COMMUNITY): Payer: Self-pay

## 2022-02-17 NOTE — Telephone Encounter (Addendum)
Oral Oncology Pharmacist Encounter ? ?Received new prescription for Revlimid (lenalidomide) for the treatment of multiple myeloma in conjunction with bortezomib and dexamethasone, planned duration until disease progression or unacceptable drug toxicity. ? ?CBC w/ Diff from 01/28/22 and CMP from  assessed, 01/10/22 assessed - noted pt Scr of 1.14 mg/dL (CrCl ~ 81 mL/min). Prescription dose and frequency assessed for appropriateness. ? ?Current medication list in Epic reviewed, no relevant/significant DDIs with Revlimid identified. ? ?Evaluated chart and no patient barriers to medication adherence noted.  ? ?Once [employer] auth # is obtained, prescription will need to [facility].  ? ?Oral Oncology Clinic will continue to follow for insurance authorization, copayment issues, initial counseling and start date. ? ?Leron Croak, PharmD, BCPS ?Hematology/Oncology Clinical Pharmacist ?[facility] Oral Chemotherapy Navigation Clinics ?[phone] ?02/17/2022 8:08 AM ? ?

## 2022-02-17 NOTE — Telephone Encounter (Signed)
Oral Oncology Patient Advocate Encounter ? ?Prior Authorization for Revlimid has been approved.   ? ?PA# [id number] ?Effective dates: 01/18/22 through 02/17/23 ? ?[facility] ? ?Oral Oncology Clinic will continue to follow.  ? ?Wynn Maudlin CPHT ?Specialty Pharmacy Patient Advocate ?[place] ?Phone [phone] ?Fax [phone] ?02/17/2022 8:58 AM ? ?

## 2022-02-17 NOTE — Telephone Encounter (Signed)
Oral Oncology Patient Advocate Encounter ? ?Prior Authorization for Revlimid has been approved.   ? ?PA# [id number] ? ?Effective dates: 01/18/22 through 02/14/22 ? ?[facility] ? ?Oral Oncology Clinic will continue to follow.  ? ?Wynn Maudlin CPHT ?Specialty Pharmacy Patient Advocate ?[place] ?Phone [phone] ?Fax [phone] ?02/17/2022 9:04 AM ? ?

## 2022-02-17 NOTE — Telephone Encounter (Signed)
Oral Oncology Patient Advocate Encounter ?  ?Received notification from [place] that prior authorization for Revlimid is required. ?  ?PA submitted on CoverMyMeds ?Key [id number] ?Status is pending ?  ?Oral Oncology Clinic will continue to follow. ? ?Wynn Maudlin CPHT ?Specialty Pharmacy Patient Advocate ?[place] ?Phone [phone] ?Fax [phone] ?02/17/2022 8:55 AM ? ? ?

## 2022-02-19 ENCOUNTER — Other Ambulatory Visit: Payer: Self-pay

## 2022-02-19 DIAGNOSIS — C9000 Multiple myeloma not having achieved remission: Secondary | ICD-10-CM

## 2022-02-20 ENCOUNTER — Other Ambulatory Visit: Payer: Self-pay

## 2022-02-20 ENCOUNTER — Encounter: Payer: Self-pay | Admitting: Hematology

## 2022-02-20 DIAGNOSIS — C9000 Multiple myeloma not having achieved remission: Secondary | ICD-10-CM

## 2022-02-20 DIAGNOSIS — Z7189 Other specified counseling: Secondary | ICD-10-CM

## 2022-02-20 LAB — SURGICAL PATHOLOGY

## 2022-02-20 MED ORDER — LENALIDOMIDE 15 MG PO CAPS: 15.0000 mg | ORAL_CAPSULE | Freq: Every day | ORAL | 0 refills | Status: DC

## 2022-02-20 NOTE — Progress Notes (Signed)
Called pt to introduce myself as his Arboriculturist.  Pt has 2 insurances so copay assistance isn't needed.  I offered the [employer], went over what it covers and gave him the income requirement.  Pt stated he exceeds the limit so he doesn't qualify for the grant at this time.   ?

## 2022-02-21 ENCOUNTER — Inpatient Hospital Stay (HOSPITAL_BASED_OUTPATIENT_CLINIC_OR_DEPARTMENT_OTHER): Payer: Medicare Other | Admitting: Hematology

## 2022-02-21 ENCOUNTER — Inpatient Hospital Stay: Payer: Medicare Other

## 2022-02-21 ENCOUNTER — Other Ambulatory Visit: Payer: Self-pay

## 2022-02-21 VITALS — BP 144/80 | HR 72 | Temp 97.7°F | Resp 18 | Ht 73.0 in | Wt 228.5 lb

## 2022-02-21 DIAGNOSIS — Z7189 Other specified counseling: Secondary | ICD-10-CM

## 2022-02-21 DIAGNOSIS — C9000 Multiple myeloma not having achieved remission: Secondary | ICD-10-CM

## 2022-02-21 DIAGNOSIS — Z5112 Encounter for antineoplastic immunotherapy: Secondary | ICD-10-CM | POA: Diagnosis present

## 2022-02-21 DIAGNOSIS — Z5111 Encounter for antineoplastic chemotherapy: Secondary | ICD-10-CM

## 2022-02-21 LAB — CBC WITH DIFFERENTIAL (CANCER CENTER ONLY)
Abs Immature Granulocytes: 0 10*3/uL (ref 0.00–0.07)
Basophils Absolute: 0 10*3/uL (ref 0.0–0.1)
Basophils Relative: 0 %
Eosinophils Absolute: 0 10*3/uL (ref 0.0–0.5)
Eosinophils Relative: 1 %
HCT: 35.5 % — ABNORMAL LOW (ref 39.0–52.0)
Hemoglobin: 12.1 g/dL — ABNORMAL LOW (ref 13.0–17.0)
Immature Granulocytes: 0 %
Lymphocytes Relative: 47 %
Lymphs Abs: 0.7 10*3/uL (ref 0.7–4.0)
MCH: 30.6 pg (ref 26.0–34.0)
MCHC: 34.1 g/dL (ref 30.0–36.0)
MCV: 89.6 fL (ref 80.0–100.0)
Monocytes Absolute: 0.2 10*3/uL (ref 0.1–1.0)
Monocytes Relative: 13 %
Neutro Abs: 0.6 10*3/uL — ABNORMAL LOW (ref 1.7–7.7)
Neutrophils Relative %: 39 %
Platelet Count: 150 10*3/uL (ref 150–400)
RBC: 3.96 MIL/uL — ABNORMAL LOW (ref 4.22–5.81)
RDW: 12.6 % (ref 11.5–15.5)
WBC Count: 1.5 10*3/uL — ABNORMAL LOW (ref 4.0–10.5)
nRBC: 0 % (ref 0.0–0.2)

## 2022-02-21 LAB — CMP (CANCER CENTER ONLY)
ALT: 27 U/L (ref 0–44)
AST: 29 U/L (ref 15–41)
Albumin: 3.8 g/dL (ref 3.5–5.0)
Alkaline Phosphatase: 39 U/L (ref 38–126)
Anion gap: 2 — ABNORMAL LOW (ref 5–15)
BUN: 20 mg/dL (ref 8–23)
CO2: 30 mmol/L (ref 22–32)
Calcium: 10 mg/dL (ref 8.9–10.3)
Chloride: 107 mmol/L (ref 98–111)
Creatinine: 1.06 mg/dL (ref 0.61–1.24)
GFR, Estimated: >60 mL/min (ref >60)
Glucose, Bld: 88 mg/dL (ref 70–99)
Potassium: 4.2 mmol/L (ref 3.5–5.1)
Sodium: 139 mmol/L (ref 135–145)
Total Bilirubin: 0.5 mg/dL (ref 0.3–1.2)
Total Protein: 7.2 g/dL (ref 6.5–8.1)

## 2022-02-21 MED ORDER — BORTEZOMIB CHEMO SQ INJECTION 3.5 MG (2.5MG/ML)
1.5000 mg/m2 | Freq: Once | INTRAMUSCULAR | Status: AC
  Administered 2022-02-21: 3.5 mg via SUBCUTANEOUS
  Filled 2022-02-21: qty 1.4

## 2022-02-21 MED ORDER — DEXAMETHASONE 4 MG PO TABS
20.0000 mg | ORAL_TABLET | Freq: Once | ORAL | Status: AC
  Administered 2022-02-21: 20 mg via ORAL
  Filled 2022-02-21: qty 5

## 2022-02-21 MED ORDER — ONDANSETRON HCL 8 MG PO TABS
8.0000 mg | ORAL_TABLET | Freq: Once | ORAL | Status: AC
  Administered 2022-02-21: 8 mg via ORAL
  Filled 2022-02-21: qty 1

## 2022-02-21 MED ORDER — LENALIDOMIDE 15 MG PO CAPS: 15.0000 mg | ORAL_CAPSULE | Freq: Every day | ORAL | 0 refills | Status: DC

## 2022-02-21 NOTE — Patient Instructions (Signed)
[facility]  Discharge Instructions: ?Thank you for choosing [facility] to provide your oncology and hematology care.  ? ?If you have a lab appointment with the [facility], please go directly to the [facility] and check in at the registration area. ?  ?Wear comfortable clothing and clothing appropriate for easy access to any Portacath or PICC line.  ? ?We strive to give you quality time with your provider. You may need to reschedule your appointment if you arrive late (15 or more minutes).  Arriving late affects you and other patients whose appointments are after yours.  Also, if you miss three or more appointments without notifying the office, you may be dismissed from the clinic at the provider?s discretion.    ?  ?For prescription refill requests, have your pharmacy contact our office and allow 72 hours for refills to be completed.   ? ?Today you received the following chemotherapy and/or immunotherapy agents : Velcade    ?  ?To help prevent nausea and vomiting after your treatment, we encourage you to take your nausea medication as directed. ? ?BELOW ARE SYMPTOMS THAT SHOULD BE REPORTED IMMEDIATELY: ?*FEVER GREATER THAN 100.4 F (38 ?C) OR HIGHER ?*CHILLS OR SWEATING ?*NAUSEA AND VOMITING THAT IS NOT CONTROLLED WITH YOUR NAUSEA MEDICATION ?*UNUSUAL SHORTNESS OF BREATH ?*UNUSUAL BRUISING OR BLEEDING ?*URINARY PROBLEMS (pain or burning when urinating, or frequent urination) ?*BOWEL PROBLEMS (unusual diarrhea, constipation, pain near the anus) ?TENDERNESS IN MOUTH AND THROAT WITH OR WITHOUT PRESENCE OF ULCERS (sore throat, sores in mouth, or a toothache) ?UNUSUAL RASH, SWELLING OR PAIN  ?UNUSUAL VAGINAL DISCHARGE OR ITCHING  ? ?Items with * indicate a potential emergency and should be followed up as soon as possible or go to the Emergency Department if any problems should occur. ? ?Please show the CHEMOTHERAPY ALERT CARD or IMMUNOTHERAPY ALERT CARD at check-in to  the Emergency Department and triage nurse. ? ?Should you have questions after your visit or need to cancel or reschedule your appointment, please contact [facility]  Dept: [phone]  and follow the prompts.  Office hours are 8:00 a.m. to 4:30 p.m. Monday - Friday. Please note that voicemails left after 4:00 p.m. may not be returned until the following business day.  We are closed weekends and major holidays. You have access to a nurse at all times for urgent questions. Please call the main number to the clinic Dept: [phone] and follow the prompts. ? ? ?For any non-urgent questions, you may also contact your provider using MyChart. We now offer e-Visits for anyone 84 and older to request care online for non-urgent symptoms. For details visit [email or web address]. ?  ?Also download the MyChart app! Go to the app store, search "MyChart", open the app, select [facility], and log in with your MyChart username and password. ? ?Due to Covid, a mask is required upon entering the hospital/clinic. If you do not have a mask, one will be given to you upon arrival. For doctor visits, patients may have 1 support person aged 79 or older with them. For treatment visits, patients cannot have anyone with them due to current Covid guidelines and our immunocompromised population.  ? ?Bortezomib injection ?What is this medication? ?BORTEZOMIB (bor TEZ oh mib) targets proteins in cancer cells and stops the cancer cells from growing. It treats multiple myeloma and mantle cell lymphoma. ?This medicine may be used for other purposes; ask your health care provider or pharmacist if you  have questions. ?COMMON BRAND NAME(S): Velcade ?What should I tell my care team before I take this medication? ?They need to know if you have any of these conditions: ?dehydration ?diabetes (high blood sugar) ?heart disease ?liver disease ?tingling of the fingers or toes or other nerve disorder ?an unusual or allergic  reaction to bortezomib, mannitol, boron, other medicines, foods, dyes, or preservatives ?pregnant or trying to get pregnant ?breast-feeding ?How should I use this medication? ?This medicine is injected into a vein or under the skin. It is given by a health care provider in a hospital or clinic setting. ?Talk to your health care provider about the use of this medicine in children. Special care may be needed. ?Overdosage: If you think you have taken too much of this medicine contact a poison control center or emergency room at once. ?NOTE: This medicine is only for you. Do not share this medicine with others. ?What if I miss a dose? ?Keep appointments for follow-up doses. It is important not to miss your dose. Call your health care provider if you are unable to keep an appointment. ?What may interact with this medication? ?This medicine may interact with the following medications: ?ketoconazole ?rifampin ?This list may not describe all possible interactions. Give your health care provider a list of all the medicines, herbs, non-prescription drugs, or dietary supplements you use. Also tell them if you smoke, drink alcohol, or use illegal drugs. Some items may interact with your medicine. ?What should I watch for while using this medication? ?Your condition will be monitored carefully while you are receiving this medicine. ?You may need blood work done while you are taking this medicine. ?You may get drowsy or dizzy. Do not drive, use machinery, or do anything that needs mental alertness until you know how this medicine affects you. Do not stand up or sit up quickly, especially if you are an older patient. This reduces the risk of dizzy or fainting spells ?This medicine may increase your risk of getting an infection. Call your health care provider for advice if you get a fever, chills, sore throat, or other symptoms of a cold or flu. Do not treat yourself. Try to avoid being around people who are sick. ?Check with your  health care provider if you have severe diarrhea, nausea, and vomiting, or if you sweat a lot. The loss of too much body fluid may make it dangerous for you to take this medicine. ?Do not become pregnant while taking this medicine or for 7 months after stopping it. Women should inform their health care provider if they wish to become pregnant or think they might be pregnant. Men should not father a child while taking this medicine and for 4 months after stopping it. There is a potential for serious harm to an unborn child. Talk to your health care provider for more information. Do not breast-feed an infant while taking this medicine or for 2 months after stopping it. ?This medicine may make it more difficult to get pregnant or father a child. Talk to your health care provider if you are concerned about your fertility. ?What side effects may I notice from receiving this medication? ?Side effects that you should report to your doctor or health care professional as soon as possible: ?allergic reactions (skin rash; itching or hives; swelling of the face, lips, or tongue) ?bleeding (bloody or black, tarry stools; red or dark brown urine; spitting up blood or brown material that looks like coffee grounds; red spots on the skin;  unusual bruising or bleeding from the eye, gums, or nose) ?blurred vision or changes in vision ?confusion ?constipation ?headache ?heart failure (trouble breathing; fast, irregular heartbeat; sudden weight gain; swelling of the ankles, feet, hands) ?infection (fever, chills, cough, sore throat, pain or trouble passing urine) ?lack or loss of appetite ?liver injury (dark yellow or brown urine; general ill feeling or flu-like symptoms; loss of appetite, right upper belly pain; yellowing of the eyes or skin) ?low blood pressure (dizziness; feeling faint or lightheaded, falls; unusually weak or tired) ?muscle cramps ?pain, redness, or irritation at site where injected ?pain, tingling, numbness in the  hands or feet ?seizures ?trouble breathing ?unusual bruising or bleeding ?Side effects that usually do not require medical attention (report to your doctor or health care professional if they continue o

## 2022-02-21 NOTE — Progress Notes (Signed)
Dr Irene Limbo aware of pt's labs today and pt is okay for tx with ANC of 0.6.  ?

## 2022-02-25 ENCOUNTER — Telehealth: Payer: Self-pay | Admitting: Hematology

## 2022-02-25 NOTE — Telephone Encounter (Signed)
Oral Chemotherapy Pharmacist Encounter ? ?I spoke with patient for overview of: Revlimid for the treatment of multiple myeloma in conjunction with Velcade and dexamethasone, planned duration until disease progression or unacceptable drug toxicity. ? ?Counseled patient on administration, dosing, side effects, monitoring, drug-food interactions, safe handling, storage, and disposal. ? ?Patient will take Revlimid 64m capsules, 1 capsule by mouth once daily, without regard to food, with a full glass of water. Revlimid will be given 14 days on, 7 days off, repeat every 21 days. ? ?Patient will receive dexamethasone in clinic prior to Velcade. ? ?Revlimid start date: 02/26/22 ? ?Adverse effects of Revlimid include but are not limited to: nausea, constipation, diarrhea, abdominal pain, rash, fatigue, and decreased blood counts.   ? ?Reviewed with patient importance of keeping a medication schedule and plan for any missed doses. No barriers to medication adherence identified. ? ?Medication reconciliation performed and medication/allergy list updated. ? ?Patient is already taking acyclovir. Patient counseled on importance of daily aspirin 821mfor VTE prophylaxis. ? ?Insurance authorization for Revlimid has been obtained. ? ?[facility] it is a limited distribution medication. Patient notified this AM that medication will not be delivered until 02/26/22. ? ?All questions answered. ? ?Nathan Martinez voiced understanding and appreciation.  ? ?Medication education handout and calendar placed in mail for patient. Patient knows to call the office with questions or concerns. Oral Chemotherapy Clinic phone number provided to patient.  ? ?ReLeron CroakPharmD, BCPS ?Hematology/Oncology Clinical Pharmacist ?[facility] ?[phone]/02/2022 11:40 AM ? ?

## 2022-02-25 NOTE — Telephone Encounter (Addendum)
Oral Chemotherapy Pharmacist Encounter  ? ?Attempted to reach patient to provide update and offer for initial counseling on oral medication: Revlimid (lenalidomide).  ? ?No answer. Left voicemail for patient to call back to discuss details of medication and initial counseling session. ? ?[facility] medication is scheduled to be delivered to patient's home 02/26/22 ? ?Leron Croak, PharmD, BCPS ?Hematology/Oncology Clinical Pharmacist ?[facility] Oral Chemotherapy Navigation Clinics ?[phone] ?02/25/2022 10:19 AM ? ? ?

## 2022-02-25 NOTE — Telephone Encounter (Signed)
Scheduled follow-up appointments per 3/31 los. Patient is aware. ?

## 2022-02-26 ENCOUNTER — Other Ambulatory Visit: Payer: Self-pay | Admitting: *Deleted

## 2022-02-26 DIAGNOSIS — C9000 Multiple myeloma not having achieved remission: Secondary | ICD-10-CM

## 2022-02-26 LAB — MULTIPLE MYELOMA PANEL, SERUM
Albumin SerPl Elph-Mcnc: 3.6 g/dL (ref 2.9–4.4)
Albumin/Glob SerPl: 1.2 (ref 0.7–1.7)
Alpha 1: 0.2 g/dL (ref 0.0–0.4)
Alpha2 Glob SerPl Elph-Mcnc: 0.6 g/dL (ref 0.4–1.0)
B-Globulin SerPl Elph-Mcnc: 0.7 g/dL (ref 0.7–1.3)
Gamma Glob SerPl Elph-Mcnc: 1.5 g/dL (ref 0.4–1.8)
Globulin, Total: 3.1 g/dL (ref 2.2–3.9)
IgA: 53 mg/dL — ABNORMAL LOW (ref 61–437)
IgG (Immunoglobin G), Serum: 1835 mg/dL — ABNORMAL HIGH (ref 603–1613)
IgM (Immunoglobulin M), Srm: 12 mg/dL — ABNORMAL LOW (ref 15–143)
M Protein SerPl Elph-Mcnc: 1.1 g/dL — ABNORMAL HIGH
Total Protein ELP: 6.7 g/dL (ref 6.0–8.5)

## 2022-02-27 ENCOUNTER — Inpatient Hospital Stay: Payer: Medicare Other | Attending: Hematology | Admitting: Hematology

## 2022-02-27 DIAGNOSIS — C9000 Multiple myeloma not having achieved remission: Secondary | ICD-10-CM | POA: Diagnosis not present

## 2022-02-27 DIAGNOSIS — Z7189 Other specified counseling: Secondary | ICD-10-CM

## 2022-02-27 DIAGNOSIS — Z5112 Encounter for antineoplastic immunotherapy: Secondary | ICD-10-CM | POA: Insufficient documentation

## 2022-02-27 MED ORDER — LENALIDOMIDE 15 MG PO CAPS: 15.0000 mg | ORAL_CAPSULE | Freq: Every day | ORAL | 3 refills | Status: DC

## 2022-02-27 NOTE — Progress Notes (Signed)
. ? ? ?HEMATOLOGY/ONCOLOGY PHONE VISIT NOTE ? ?Date of Service: 02/27/2022 ? ? ?Patient Care Team: ?[name], MD as PCP - General (Family Medicine) ?[name], MD as Consulting Physician (Hematology) ? ?CHIEF COMPLAINTS/PURPOSE OF CONSULTATION:  ?Follow-up for continued evaluation and management of multiple myeloma with labs. ? ?HISTORY OF PRESENTING ILLNESS:  ?Please see previous note for details of initial presentation ? ?INTERVAL HISTORY ?I connected with Nathan Martinez on 02/27/2022  by telephone visit and verified that I am speaking with the correct person using two identifiers.  ? ?I discussed the limitations, risks, security and privacy concerns of performing an evaluation and management service by telemedicine and the availability of in-person appointments. I also discussed with the patient that there may be a patient responsible charge related to this service. The patient expressed understanding and agreed to proceed.  ? ?Other persons participating in the visit and their role in the encounter: None ? ?Patient?s location: Home ?Provider?s location: [place] ? ?I connected with Nathan Martinez who is a 76 y.o. male via phone for follow-up for continued evaluation and management of multiple myeloma. He reports He is doing well with no new symptoms or concerns. ? ?He is tolerating current treatment well. ? ?No other new or acute focal symptoms. ? ?Labs from 02/21/2022 CBC shows hemoglobin of 12.1, decreased WBC count of 1.5k with decreased ANC of 600 and platelets of 150k ?CMP shows normal creatinine of 1.06 with calcium level at 10. ?myeloma panel shows IgG level of 1835 with an M spike of 1.1 g/dL. ? ?MEDICAL HISTORY:  ?Intermittent hypercalcemia - primary hyperparathyroidism status post parathyroidectomy in 2019 ?HTN ?HLD ?Glaucoma ?History of Helicobacter pylori infection previously treated ?Chronic leukopenia/neutropenia ?History of present iron deficiency anemia ? ?SURGICAL  HISTORY: ? ?Rt TKA 2018 ?Parathyroidectomy 2019 ?B/l cataracts sx 2021 ? ?SOCIAL HISTORY: ?Social History  ? ?Socioeconomic History  ? Marital status: Married  ?  Spouse name: Not on file  ? Number of children: Not on file  ? Years of education: Not on file  ? Highest education level: Not on file  ?Occupational History  ? Not on file  ?Tobacco Use  ? Smoking status: Never  ? Smokeless tobacco: Never  ?Vaping Use  ? Vaping Use: Never used  ?Substance and Sexual Activity  ? Alcohol use: Not Currently  ? Drug use: Never  ? Sexual activity: Not Currently  ?  Birth control/protection: None  ?Other Topics Concern  ? Not on file  ?Social History Narrative  ? Not on file  ? ?Social Determinants of Health  ? 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 ?FAMILY HISTORY: ?Sister - uterine/ovarian cancer 49's  ?Mother-- lived into her 34's ?Father - died in 36's - fall/trauma ? ?ALLERGIES:  has No Known Allergies. ? ?MEDICATIONS:  ? ?Krill oil ?Olmesartan hydrochlorothiazide 20-12.5 mg ?Ferrex 150 mg p.o. daily ?Aspirin 81 mg p.o. daily ?Atorvastatin 20 mg p.o. daily ?Multivitamin 1 tablet p.o. daily ?Ergocalciferol 50,000 units once weekly ? ? ?REVIEW OF SYSTEMS:   ? ?10 Point review of Systems was done is negative except as noted above. ? ?PHYSICAL EXAMINATION: ?Marland KitchenThere were no vitals taken for this visit. ?Telemedicine appointment ? ?LABORATORY  DATA:  ?I have reviewed the data as listed ? ?. ? ?  Latest Ref Rng & Units 02/21/2022  ? 10:02 AM 01/28/2022  ?  9:27 AM 01/10/2022  ? 10:32 AM  ?CBC  ?WBC 4.0 - 10.5 K/uL 1.5   1.8   1.7    ?Hemoglobin 13.0 - 17.0 g/dL 12.1   13.7   12.9    ?Hematocrit 39.0 - 52.0 % 35.5   40.2   38.6    ?Platelets 150 - 400 K/uL 150   160   165     ? ?.CBC ?   ?Component Value Date/Time  ? WBC 1.5 (L) 02/21/2022 1002  ? WBC 1.8 (L) 01/28/2022 7681  ? RBC 3.96 (L) 02/21/2022 1002  ? HGB 12.1 (L) 02/21/2022 1002  ? HCT 35.5 (L) 02/21/2022 1002  ? PLT 150 02/21/2022 1002  ? MCV 89.6 02/21/2022 1002  ? MCH 30.6 02/21/2022 1002  ? MCHC 34.1 02/21/2022 1002  ? RDW 12.6 02/21/2022 1002  ? LYMPHSABS 0.7 02/21/2022 1002  ? MONOABS 0.2 02/21/2022 1002  ? EOSABS 0.0 02/21/2022 1002  ? BASOSABS 0.0 02/21/2022 1002  ? ? ?. ? ?  Latest Ref Rng & Units 02/21/2022  ? 10:02 AM 01/10/2022  ? 10:32 AM 12/12/2021  ? 12:25 PM  ?CMP  ?Glucose 70 - 99 mg/dL 88   103   99    ?BUN 8 - 23 mg/dL 20   17   22     ?Creatinine 0.61 - 1.24 mg/dL 1.06   1.14   1.10    ?Sodium 135 - 145 mmol/L 139   137   137    ?Potassium 3.5 - 5.1 mmol/L 4.2   4.5   4.1    ?Chloride 98 - 111 mmol/L 107   105   103    ?CO2 22 - 32 mmol/L 30   29   28     ?Calcium 8.9 - 10.3 mg/dL 10.0   10.2   10.7    ? 10.7    ?Total Protein 6.5 - 8.1 g/dL 7.2   7.2   7.7    ?Total Bilirubin 0.3 - 1.2 mg/dL 0.5   0.6   0.6    ?Alkaline Phos 38 - 126 U/L 39   36   37    ?AST 15 - 41 U/L 29   27   27     ?ALT 0 - 44 U/L 27   22   26     ? ? ? ? ? ? ?RADIOGRAPHIC STUDIES: ? ? ? ?ASSESSMENT & PLAN:  ? 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 ? 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 ? ?3) intermittent hypercalcemia. - resolved ?- which has resolved since discontinuation of hydrochlorothiazide. ? ? ?PLAN ?Labs from 01/10/2022 CBC shows hemoglobin of 12.9, WBC count of 1.7k with ANC of 700 and platelets of 165k ?CMP shows normal creatinine of 1.14 with normalization of his calcium level to  10.2 after stopping his hydrochlorothiazide. ?12/12/2021 myeloma panel shows IgG level of 1924 with an M spike of 1 g/dL. ?-We discussed bone marrow biopsy done 01/28/2022 which revealed "DIAGNOSIS:  ? ?BONE MARROW, ASPIRATE, CLOT, CORE:  ?-  Hypercellular marrow involved by plasma  cell neoplasm  ?-  See comment microscopic description below  ? ?PERIPHERAL BLOOD:  ?-  Leukopenia  ?-  See complete blood cell count  ? ?COMMENT:  ? ?The bone marrow biopsy shows a kappa restricted plasma cell neoplasm  ?comprising 15% of the total marrow cellularity.  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 " ?-We will continue Velcade at this time and switch to Carfilzomib if cytogenetics are adverse. ?We also discussed getting dental clearance since he will need to be on Zometa every 4 weeks. ? ?All the patient's questions were answered in details. ? ?Follow-up ?F/u as per other scheduled treatments ? ? The total time spent in the appointment was 15 minutes* ? ?All of the patient's questions were answered with apparent satisfaction. The patient knows to call the clinic with any problems, questions or concerns. ? ? ?Sullivan Lone MD MS AAHIVMS [facility] ?Hematology/Oncology Physician ?[place] ? ?.*Total Encounter Time as defined by the Centers for Medicare and Medicaid Services includes, in addition to the face-to-face time of a patient visit (documented in the note above) non-face-to-face time: obtaining and reviewing outside history, ordering and reviewing medications, tests or procedures, care coordination (communications with other health care professionals or caregivers) and documentation in the medical record. ? ?I, Melene Muller, am acting as scribe for Dr. Sullivan Lone, MD. ? ?.I have reviewed the above documentation for accuracy and completeness, and I agree with the above. ?.[name] MD ? ?

## 2022-02-28 ENCOUNTER — Telehealth: Payer: Self-pay | Admitting: Hematology

## 2022-02-28 ENCOUNTER — Encounter: Payer: Self-pay | Admitting: Hematology

## 2022-02-28 ENCOUNTER — Inpatient Hospital Stay: Payer: Medicare Other

## 2022-02-28 ENCOUNTER — Other Ambulatory Visit: Payer: Self-pay | Admitting: Hematology

## 2022-02-28 ENCOUNTER — Encounter: Payer: Self-pay | Admitting: Family Medicine

## 2022-02-28 ENCOUNTER — Other Ambulatory Visit: Payer: Self-pay

## 2022-02-28 VITALS — BP 147/75 | HR 60 | Temp 98.1°F | Resp 16 | Wt 225.0 lb

## 2022-02-28 DIAGNOSIS — C9000 Multiple myeloma not having achieved remission: Secondary | ICD-10-CM | POA: Diagnosis present

## 2022-02-28 DIAGNOSIS — Z5112 Encounter for antineoplastic immunotherapy: Secondary | ICD-10-CM | POA: Diagnosis present

## 2022-02-28 DIAGNOSIS — Z7189 Other specified counseling: Secondary | ICD-10-CM

## 2022-02-28 LAB — CBC WITH DIFFERENTIAL (CANCER CENTER ONLY)
Abs Immature Granulocytes: 0.01 10*3/uL (ref 0.00–0.07)
Basophils Absolute: 0 10*3/uL (ref 0.0–0.1)
Basophils Relative: 0 %
Eosinophils Absolute: 0 10*3/uL (ref 0.0–0.5)
Eosinophils Relative: 1 %
HCT: 36.4 % — ABNORMAL LOW (ref 39.0–52.0)
Hemoglobin: 12.1 g/dL — ABNORMAL LOW (ref 13.0–17.0)
Immature Granulocytes: 0 %
Lymphocytes Relative: 41 %
Lymphs Abs: 0.9 10*3/uL (ref 0.7–4.0)
MCH: 29.9 pg (ref 26.0–34.0)
MCHC: 33.2 g/dL (ref 30.0–36.0)
MCV: 89.9 fL (ref 80.0–100.0)
Monocytes Absolute: 0.3 10*3/uL (ref 0.1–1.0)
Monocytes Relative: 11 %
Neutro Abs: 1 10*3/uL — ABNORMAL LOW (ref 1.7–7.7)
Neutrophils Relative %: 47 %
Platelet Count: 194 10*3/uL (ref 150–400)
RBC: 4.05 MIL/uL — ABNORMAL LOW (ref 4.22–5.81)
RDW: 12.9 % (ref 11.5–15.5)
WBC Count: 2.3 10*3/uL — ABNORMAL LOW (ref 4.0–10.5)
nRBC: 0 % (ref 0.0–0.2)

## 2022-02-28 LAB — CMP (CANCER CENTER ONLY)
ALT: 22 U/L (ref 0–44)
AST: 22 U/L (ref 15–41)
Albumin: 3.8 g/dL (ref 3.5–5.0)
Alkaline Phosphatase: 40 U/L (ref 38–126)
Anion gap: 4 — ABNORMAL LOW (ref 5–15)
BUN: 18 mg/dL (ref 8–23)
CO2: 29 mmol/L (ref 22–32)
Calcium: 10.3 mg/dL (ref 8.9–10.3)
Chloride: 104 mmol/L (ref 98–111)
Creatinine: 1.14 mg/dL (ref 0.61–1.24)
GFR, Estimated: >60 mL/min (ref >60)
Glucose, Bld: 108 mg/dL — ABNORMAL HIGH (ref 70–99)
Potassium: 4.4 mmol/L (ref 3.5–5.1)
Sodium: 137 mmol/L (ref 135–145)
Total Bilirubin: 0.5 mg/dL (ref 0.3–1.2)
Total Protein: 7.2 g/dL (ref 6.5–8.1)

## 2022-02-28 MED ORDER — BORTEZOMIB CHEMO SQ INJECTION 3.5 MG (2.5MG/ML)
1.5000 mg/m2 | Freq: Once | INTRAMUSCULAR | Status: AC
  Administered 2022-02-28: 3.5 mg via SUBCUTANEOUS
  Filled 2022-02-28: qty 1.4

## 2022-02-28 MED ORDER — DEXAMETHASONE 4 MG PO TABS
20.0000 mg | ORAL_TABLET | Freq: Once | ORAL | Status: AC
  Administered 2022-02-28: 20 mg via ORAL

## 2022-02-28 MED ORDER — ONDANSETRON HCL 8 MG PO TABS
8.0000 mg | ORAL_TABLET | Freq: Once | ORAL | Status: AC
  Administered 2022-02-28: 8 mg via ORAL
  Filled 2022-02-28: qty 1

## 2022-02-28 NOTE — Progress Notes (Addendum)
. ? ? ?HEMATOLOGY/ONCOLOGY PHONE VISIT NOTE ? ?Date of Service: .02/21/2022 ? ? ?Patient Care Team: ?[name], MD as PCP - General (Family Medicine) ?[name], MD as Consulting Physician (Hematology) ? ?CHIEF COMPLAINTS/PURPOSE OF CONSULTATION:  ?Follow-up for continued evaluation and management of multiple myeloma ? ?HISTORY OF PRESENTING ILLNESS:  ?Please see previous note for details of initial presentation ? ?INTERVAL HISTORY ? ?Mr. Nathan Martinez is here for follow-up of his multiple myeloma management at this time.  He notes no acute new symptoms since his last clinic visit and is scheduled to start his Velcade today.  Labs reviewed and orders signed to initiate treatment today.  He is tolerating his Revlimid prescription to be delivered to his house and will start once he receives this. ?No other acute new symptoms since his last clinic visit. ?Labs done today 02/21/2022 were reviewed with him in detail. ? ? ?MEDICAL HISTORY:  ?Intermittent hypercalcemia - primary hyperparathyroidism status post parathyroidectomy in 2019 ?HTN ?HLD ?Glaucoma ?History of Helicobacter pylori infection previously treated ?Chronic leukopenia/neutropenia ?History of present iron deficiency anemia ? ?SURGICAL HISTORY: ? ?Rt TKA 2018 ?Parathyroidectomy 2019 ?B/l cataracts sx 2021 ? ?SOCIAL HISTORY: ?Social History  ? ?Socioeconomic History  ? Marital status: Married  ?  Spouse name: Not on file  ? Number of children: Not on file  ? Years of education: Not on file  ? Highest education level: Not on file  ?Occupational History  ? Not on file  ?Tobacco Use  ? Smoking status: Never  ? Smokeless tobacco: Never  ?Vaping Use  ? Vaping Use: Never used  ?Substance and Sexual Activity  ? Alcohol use: Not Currently  ? Drug use: Never  ? Sexual activity: Not Currently  ?  Birth control/protection: None  ?Other Topics Concern  ? Not on file  ?Social History Narrative  ? Not on file  ? ?Social Determinants of Health  ? 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 ?FAMILY HISTORY: ?Sister - uterine/ovarian cancer 52's  ?Mother-- lived into her 47's ?Father - died in 29's - fall/trauma ? ?ALLERGIES:  has No Known Allergies. ? ?MEDICATIONS:  ? ?Krill oil ?Olmesartan hydrochlorothiazide 20-12.5 mg ?Ferrex 150 mg p.o. daily ?Aspirin 81 mg p.o. daily ?Atorvastatin 20 mg p.o. daily ?Multivitamin 1 tablet p.o. daily ?Ergocalciferol 50,000 units once weekly ? ? ?REVIEW OF SYSTEMS:   ?10 Point review of Systems was done is negative except as noted above. ? ?PHYSICAL EXAMINATION: ?.BP (!) 144/80 (BP Location: Left Arm, Patient Position: Sitting) Comment: nurse notified  Pulse 72   Temp 97.7 ?F (36.5 ?C) (Temporal)   Resp 18   Ht $R'6\' 1"'ot$  (1.854 m)   Wt 228 lb 8 oz (103.6 kg)   SpO2 100%   BMI 30.15 kg/m?  ?NAD ?GENERAL:alert, in no acute distress and comfortable ?SKIN: no acute rashes, no significant lesions ?EYES: conjunctiva are pink and non-injected, sclera anicteric ?OROPHARYNX: MMM, no exudates, no oropharyngeal erythema or ulceration ?NECK: supple, no JVD ?LYMPH:  no palpable lymphadenopathy in the cervical, axillary or inguinal regions ?LUNGS: clear to auscultation b/l with normal respiratory effort ?HEART: regular rate & rhythm ?ABDOMEN:  normoactive bowel sounds , non tender, not distended. ?Extremity: no pedal edema ?  PSYCH: alert & oriented x 3 with fluent speech ?NEURO: no focal motor/sensory deficits ? ?LABORATORY DATA:  ?I have reviewed the data as listed ? ?. ? ?  Latest Ref Rng & Units 02/21/2022  ? 10:02 AM 01/28/2022  ?  9:27 AM 01/10/2022  ? 10:32 AM  ?CBC  ?WBC 4.0 - 10.5 K/uL 1.5   1.8   1.7    ?Hemoglobin 13.0 - 17.0 g/dL 12.1   13.7   12.9    ?Hematocrit 39.0 - 52.0 % 35.5    40.2   38.6    ?Platelets 150 - 400 K/uL 150   160   165    ? ?.CBC ?   ?Component Value Date/Time  ? WBC 1.5 (L) 02/21/2022 1002  ? WBC 1.8 (L) 01/28/2022 7353  ? RBC 3.96 (L) 02/21/2022 1002  ? HGB 12.1 (L) 02/21/2022 1002  ? HCT 35.5 (L) 02/21/2022 1002  ? PLT 150 02/21/2022 1002  ? MCV 89.6 02/21/2022 1002  ? MCH 30.6 02/21/2022 1002  ? MCHC 34.1 02/21/2022 1002  ? RDW 12.6 02/21/2022 1002  ? LYMPHSABS 0.7 02/21/2022 1002  ? MONOABS 0.2 02/21/2022 1002  ? EOSABS 0.0 02/21/2022 1002  ? BASOSABS 0.0 02/21/2022 1002  ? ? ?. ? ?  Latest Ref Rng & Units 02/21/2022  ? 10:02 AM 01/10/2022  ? 10:32 AM 12/12/2021  ? 12:25 PM  ?CMP  ?Glucose 70 - 99 mg/dL 88   103   99    ?BUN 8 - 23 mg/dL 20   17   22     ?Creatinine 0.61 - 1.24 mg/dL 1.06   1.14   1.10    ?Sodium 135 - 145 mmol/L 139   137   137    ?Potassium 3.5 - 5.1 mmol/L 4.2   4.5   4.1    ?Chloride 98 - 111 mmol/L 107   105   103    ?CO2 22 - 32 mmol/L 30   29   28     ?Calcium 8.9 - 10.3 mg/dL 10.0   10.2   10.7    ? 10.7    ?Total Protein 6.5 - 8.1 g/dL 7.2   7.2   7.7    ?Total Bilirubin 0.3 - 1.2 mg/dL 0.5   0.6   0.6    ?Alkaline Phos 38 - 126 U/L 39   36   37    ?AST 15 - 41 U/L 29   27   27     ?ALT 0 - 44 U/L 27   22   26     ? ? ? ? ? ? ?RADIOGRAPHIC STUDIES: ? ? ? ?ASSESSMENT & PLAN:  ? 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 ?1) Active multiple myeloma with PET/CT showing bone lesions consistent with active myeloma. ?Patient has had monoclonal paraproteinemia since at least 2017 as per outside records. ? ?PET CT scan done on 01/10/2022 shows 2 small hypermetabolic foci involving the left anterior eighth rib and L2 vertebral body.  These are concerning for small myelomatous lesions.  No other osseous abnormalities or extraosseous myeloma noted. ? ?Baseline myeloma panel with M spike of 1.1 g/dL ? ?2) chronic leukopenia/neutropenia in the  context of myeloma. 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 ? ?3) intermittent hypercalcemia.  Previously attributed to primary hyperparathyroidism and patient is status post parathyroidectomy in 2019. ?Still with intermittent hypercalcemia  likely related to his hydrochlorothiazide.  Now resolved as the patient has been off hydrochlorothiazide. ?PLAN ?-Patient's labs today were reviewed in detail. 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 ?-He has not yet received his Revlimid but should be receiving this in the next few days. ?-Patient is appropriate to proceed with starting Velcade today. ?-We will start Revlimid at 15 mg 2 weeks on 1 week off for the first cycle. ?-We will change Velcade to 21-day cycles with weekly Velcade and Revlimid 15 mg 3 weeks on 1 week off from cycle 2. ?All the patient's and his wife's questions were answered in details. ? ?Follow-up ?Phone visit with Dr Irene Limbo on 4/6 for toxicity check ?Plz schedule 1st and 2nd cycle of treatment as per orders ?MD visit with C2D1 ? ?The total time spent in the appointment was 20 minutes*. ? ?All of the patient's questions were answered with apparent satisfaction. The patient knows to call the clinic with any problems, questions or concerns. ? ? ?Sullivan Lone MD MS AAHIVMS [facility] ?Hematology/Oncology Physician ?[place] ? ?.*Total Encounter Time as defined by the Centers for Medicare and Medicaid Services includes, in addition to the face-to-face time of a patient visit (documented in the note above) non-face-to-face time: obtaining and reviewing outside history, ordering and reviewing medications, tests or procedures, care coordination (communications with other health care professionals or caregivers) and documentation in the medical record. ? ? ? ? ? ? ?.[name] ?

## 2022-02-28 NOTE — Progress Notes (Signed)
Patient with multiple myeloma needing bone marrow biopsy and cytogenetic testing for pretreatment evaluation of disease status. ?

## 2022-02-28 NOTE — Patient Instructions (Signed)
[facility]  Discharge Instructions: ?Thank you for choosing [facility] to provide your oncology and hematology care.  ? ?If you have a lab appointment with the [facility], please go directly to the [facility] and check in at the registration area. ?  ?Wear comfortable clothing and clothing appropriate for easy access to any Portacath or PICC line.  ? ?We strive to give you quality time with your provider. You may need to reschedule your appointment if you arrive late (15 or more minutes).  Arriving late affects you and other patients whose appointments are after yours.  Also, if you miss three or more appointments without notifying the office, you may be dismissed from the clinic at the provider?s discretion.    ?  ?For prescription refill requests, have your pharmacy contact our office and allow 72 hours for refills to be completed.   ? ?Today you received the following chemotherapy- velcade ?  ?To help prevent nausea and vomiting after your treatment, we encourage you to take your nausea medication as directed. ? ?BELOW ARE SYMPTOMS THAT SHOULD BE REPORTED IMMEDIATELY: ?*FEVER GREATER THAN 100.4 F (38 ?C) OR HIGHER ?*CHILLS OR SWEATING ?*NAUSEA AND VOMITING THAT IS NOT CONTROLLED WITH YOUR NAUSEA MEDICATION ?*UNUSUAL SHORTNESS OF BREATH ?*UNUSUAL BRUISING OR BLEEDING ?*URINARY PROBLEMS (pain or burning when urinating, or frequent urination) ?*BOWEL PROBLEMS (unusual diarrhea, constipation, pain near the anus) ?TENDERNESS IN MOUTH AND THROAT WITH OR WITHOUT PRESENCE OF ULCERS (sore throat, sores in mouth, or a toothache) ?UNUSUAL RASH, SWELLING OR PAIN  ?UNUSUAL VAGINAL DISCHARGE OR ITCHING  ? ?Items with * indicate a potential emergency and should be followed up as soon as possible or go to the Emergency Department if any problems should occur. ? ?Please show the CHEMOTHERAPY ALERT CARD or IMMUNOTHERAPY ALERT CARD at check-in to the Emergency Department and  triage nurse. ? ?Should you have questions after your visit or need to cancel or reschedule your appointment, please contact [facility]  Dept: [phone]  and follow the prompts.  Office hours are 8:00 a.m. to 4:30 p.m. Monday - Friday. Please note that voicemails left after 4:00 p.m. may not be returned until the following business day.  We are closed weekends and major holidays. You have access to a nurse at all times for urgent questions. Please call the main number to the clinic Dept: [phone] and follow the prompts. ? ? ?For any non-urgent questions, you may also contact your provider using MyChart. We now offer e-Visits for anyone 26 and older to request care online for non-urgent symptoms. For details visit [email or web address]. ?  ?Also download the MyChart app! Go to the app store, search "MyChart", open the app, select [facility], and log in with your MyChart username and password. ? ?Due to Covid, a mask is required upon entering the hospital/clinic. If you do not have a mask, one will be given to you upon arrival. For doctor visits, patients may have 1 support person aged 83 or older with them. For treatment visits, patients cannot have anyone with them due to current Covid guidelines and our immunocompromised population.  ? ?

## 2022-02-28 NOTE — Progress Notes (Signed)
Patient here today for velcade, WBC 2.3 and ANC 1.0. Dr. Lorenso Courier notified, ok to treat per MD. ? ?Patient is taking revlimid as prescribed.  Patient  has not missed any doses and reports no side effects at this time.   ? ? ?Treatment given per orders. Patient tolerated it well without problems. Vitals stable and discharged home from clinic ambulatory. Follow up as scheduled. ? ?

## 2022-02-28 NOTE — Progress Notes (Signed)
Dr. Lorenso Courier aware of pt's labs: WBC: 2.3 and ANC 1.0  Pt okay for tx today.  ?

## 2022-02-28 NOTE — Telephone Encounter (Signed)
Patient called concerned that his schedule was not correct. Patient is scheduled for Cycle 1 Day 8 on 04/07 and Cycle 1 Day 15 on 04/10. Patient believes appointment should be 04/07 and 04/14 instead. Approved to reschedule appointment from 04/10 to 04/14 per Murray Hodgkins. Patient is aware of changes. ?

## 2022-03-03 ENCOUNTER — Inpatient Hospital Stay: Payer: Medicare Other

## 2022-03-06 ENCOUNTER — Other Ambulatory Visit: Payer: Self-pay

## 2022-03-06 ENCOUNTER — Encounter: Payer: Self-pay | Admitting: Hematology

## 2022-03-06 DIAGNOSIS — C9000 Multiple myeloma not having achieved remission: Secondary | ICD-10-CM

## 2022-03-07 ENCOUNTER — Inpatient Hospital Stay: Payer: Medicare Other

## 2022-03-07 ENCOUNTER — Other Ambulatory Visit: Payer: Self-pay

## 2022-03-07 VITALS — BP 153/82 | HR 64 | Temp 98.6°F | Resp 18 | Wt 223.0 lb

## 2022-03-07 DIAGNOSIS — Z5112 Encounter for antineoplastic immunotherapy: Secondary | ICD-10-CM | POA: Diagnosis not present

## 2022-03-07 DIAGNOSIS — C9000 Multiple myeloma not having achieved remission: Secondary | ICD-10-CM

## 2022-03-07 DIAGNOSIS — Z7189 Other specified counseling: Secondary | ICD-10-CM

## 2022-03-07 LAB — CBC WITH DIFFERENTIAL (CANCER CENTER ONLY)
Abs Immature Granulocytes: 0 10*3/uL (ref 0.00–0.07)
Basophils Absolute: 0 10*3/uL (ref 0.0–0.1)
Basophils Relative: 0 %
Eosinophils Absolute: 0 10*3/uL (ref 0.0–0.5)
Eosinophils Relative: 1 %
HCT: 36 % — ABNORMAL LOW (ref 39.0–52.0)
Hemoglobin: 11.9 g/dL — ABNORMAL LOW (ref 13.0–17.0)
Immature Granulocytes: 0 %
Lymphocytes Relative: 25 %
Lymphs Abs: 0.7 10*3/uL (ref 0.7–4.0)
MCH: 30 pg (ref 26.0–34.0)
MCHC: 33.1 g/dL (ref 30.0–36.0)
MCV: 90.7 fL (ref 80.0–100.0)
Monocytes Absolute: 0.3 10*3/uL (ref 0.1–1.0)
Monocytes Relative: 12 %
Neutro Abs: 1.7 10*3/uL (ref 1.7–7.7)
Neutrophils Relative %: 62 %
Platelet Count: 122 10*3/uL — ABNORMAL LOW (ref 150–400)
RBC: 3.97 MIL/uL — ABNORMAL LOW (ref 4.22–5.81)
RDW: 13.2 % (ref 11.5–15.5)
WBC Count: 2.7 10*3/uL — ABNORMAL LOW (ref 4.0–10.5)
nRBC: 0 % (ref 0.0–0.2)

## 2022-03-07 LAB — CMP (CANCER CENTER ONLY)
ALT: 22 U/L (ref 0–44)
AST: 21 U/L (ref 15–41)
Albumin: 3.9 g/dL (ref 3.5–5.0)
Alkaline Phosphatase: 37 U/L — ABNORMAL LOW (ref 38–126)
Anion gap: 1 — ABNORMAL LOW (ref 5–15)
BUN: 19 mg/dL (ref 8–23)
CO2: 29 mmol/L (ref 22–32)
Calcium: 9.9 mg/dL (ref 8.9–10.3)
Chloride: 104 mmol/L (ref 98–111)
Creatinine: 1.18 mg/dL (ref 0.61–1.24)
GFR, Estimated: >60 mL/min (ref >60)
Glucose, Bld: 83 mg/dL (ref 70–99)
Potassium: 4.2 mmol/L (ref 3.5–5.1)
Sodium: 134 mmol/L — ABNORMAL LOW (ref 135–145)
Total Bilirubin: 0.7 mg/dL (ref 0.3–1.2)
Total Protein: 6.9 g/dL (ref 6.5–8.1)

## 2022-03-07 MED ORDER — DEXAMETHASONE 4 MG PO TABS
20.0000 mg | ORAL_TABLET | Freq: Once | ORAL | Status: AC
  Administered 2022-03-07: 20 mg via ORAL
  Filled 2022-03-07: qty 5

## 2022-03-07 MED ORDER — ONDANSETRON HCL 8 MG PO TABS
8.0000 mg | ORAL_TABLET | Freq: Once | ORAL | Status: AC
  Administered 2022-03-07: 8 mg via ORAL
  Filled 2022-03-07: qty 1

## 2022-03-07 MED ORDER — BORTEZOMIB CHEMO SQ INJECTION 3.5 MG (2.5MG/ML)
1.5000 mg/m2 | Freq: Once | INTRAMUSCULAR | Status: AC
  Administered 2022-03-07: 3.5 mg via SUBCUTANEOUS
  Filled 2022-03-07: qty 1.4

## 2022-03-07 NOTE — Patient Instructions (Signed)
[facility] MEDICAL ONCOLOGY   ?Discharge Instructions: ?Thank you for choosing [facility] to provide your oncology and hematology care.  ? ?If you have a lab appointment with the Cancer Center, please go directly to the Cancer Center and check in at the registration area. ?  ?Wear comfortable clothing and clothing appropriate for easy access to any Portacath or PICC line.  ? ?We strive to give you quality time with your provider. You may need to reschedule your appointment if you arrive late (15 or more minutes).  Arriving late affects you and other patients whose appointments are after yours.  Also, if you miss three or more appointments without notifying the office, you may be dismissed from the clinic at the provider?s discretion.    ?  ?For prescription refill requests, have your pharmacy contact our office and allow 72 hours for refills to be completed.   ? ?Today you received the following chemotherapy and/or immunotherapy agents: bortezomib    ?  ?To help prevent nausea and vomiting after your treatment, we encourage you to take your nausea medication as directed. ? ?BELOW ARE SYMPTOMS THAT SHOULD BE REPORTED IMMEDIATELY: ?*FEVER GREATER THAN 100.4 F (38 ?C) OR HIGHER ?*CHILLS OR SWEATING ?*NAUSEA AND VOMITING THAT IS NOT CONTROLLED WITH YOUR NAUSEA MEDICATION ?*UNUSUAL SHORTNESS OF BREATH ?*UNUSUAL BRUISING OR BLEEDING ?*URINARY PROBLEMS (pain or burning when urinating, or frequent urination) ?*BOWEL PROBLEMS (unusual diarrhea, constipation, pain near the anus) ?TENDERNESS IN MOUTH AND THROAT WITH OR WITHOUT PRESENCE OF ULCERS (sore throat, sores in mouth, or a toothache) ?UNUSUAL RASH, SWELLING OR PAIN  ?UNUSUAL VAGINAL DISCHARGE OR ITCHING  ? ?Items with * indicate a potential emergency and should be followed up as soon as possible or go to the Emergency Department if any problems should occur. ? ?Please show the CHEMOTHERAPY ALERT CARD or IMMUNOTHERAPY ALERT CARD at check-in  to the Emergency Department and triage nurse. ? ?Should you have questions after your visit or need to cancel or reschedule your appointment, please contact [facility]ICAL ONCOLOG[facility]  Dept: [phone]  and follow the prompts.  Office hours are 8:00 a.m. to 4:30 p.m. Monday - Friday. Please note that voicemails left after 4:00 p.m. may not be returned until the following business day.  We are closed weekends and major holidays. You have access to a nurse at all times for urgent questions. Please call the main number to the clinic Dept: [phone] and follow the prompts. ? ? ?For any non-urgent questions, you may also contact your provider using MyChart. We now offer e-Visits for anyone 18 and older to request care online for non-urgent symptoms. For details visit [email or web address]. ?  ?Also download the MyChart app! Go to the app store, search "MyChart", open the app, select [facility], and log in with your MyChart username and password. ? ?Due to Covid, a mask is required upon entering the hospital/clinic. If you do not have a mask, one will be given to you upon arrival. For doctor visits, patients may have 1 support person aged 18 or older with them. For treatment visits, patients cannot have anyone with them due to current Covid guidelines and our immunocompromised population.  ? ?

## 2022-03-11 ENCOUNTER — Other Ambulatory Visit: Payer: Self-pay | Admitting: Hematology

## 2022-03-11 DIAGNOSIS — C9000 Multiple myeloma not having achieved remission: Secondary | ICD-10-CM

## 2022-03-11 DIAGNOSIS — Z7189 Other specified counseling: Secondary | ICD-10-CM

## 2022-03-12 ENCOUNTER — Other Ambulatory Visit: Payer: Self-pay

## 2022-03-12 DIAGNOSIS — C9000 Multiple myeloma not having achieved remission: Secondary | ICD-10-CM

## 2022-03-14 ENCOUNTER — Inpatient Hospital Stay (HOSPITAL_BASED_OUTPATIENT_CLINIC_OR_DEPARTMENT_OTHER): Payer: Medicare Other | Admitting: Hematology

## 2022-03-14 ENCOUNTER — Inpatient Hospital Stay: Payer: Medicare Other

## 2022-03-14 ENCOUNTER — Other Ambulatory Visit: Payer: Self-pay

## 2022-03-14 VITALS — BP 145/64 | HR 69 | Temp 97.5°F | Resp 20 | Wt 229.1 lb

## 2022-03-14 DIAGNOSIS — Z7189 Other specified counseling: Secondary | ICD-10-CM

## 2022-03-14 DIAGNOSIS — C9000 Multiple myeloma not having achieved remission: Secondary | ICD-10-CM

## 2022-03-14 DIAGNOSIS — Z5112 Encounter for antineoplastic immunotherapy: Secondary | ICD-10-CM | POA: Diagnosis not present

## 2022-03-14 LAB — CBC WITH DIFFERENTIAL (CANCER CENTER ONLY)
Abs Immature Granulocytes: 0 10*3/uL (ref 0.00–0.07)
Basophils Absolute: 0 10*3/uL (ref 0.0–0.1)
Basophils Relative: 1 %
Eosinophils Absolute: 0.1 10*3/uL (ref 0.0–0.5)
Eosinophils Relative: 3 %
HCT: 34.4 % — ABNORMAL LOW (ref 39.0–52.0)
Hemoglobin: 11.3 g/dL — ABNORMAL LOW (ref 13.0–17.0)
Immature Granulocytes: 0 %
Lymphocytes Relative: 33 %
Lymphs Abs: 0.7 10*3/uL (ref 0.7–4.0)
MCH: 29.7 pg (ref 26.0–34.0)
MCHC: 32.8 g/dL (ref 30.0–36.0)
MCV: 90.5 fL (ref 80.0–100.0)
Monocytes Absolute: 0.3 10*3/uL (ref 0.1–1.0)
Monocytes Relative: 16 %
Neutro Abs: 1 10*3/uL — ABNORMAL LOW (ref 1.7–7.7)
Neutrophils Relative %: 47 %
Platelet Count: 95 10*3/uL — ABNORMAL LOW (ref 150–400)
RBC: 3.8 MIL/uL — ABNORMAL LOW (ref 4.22–5.81)
RDW: 13.7 % (ref 11.5–15.5)
WBC Count: 2.1 10*3/uL — ABNORMAL LOW (ref 4.0–10.5)
nRBC: 0 % (ref 0.0–0.2)

## 2022-03-14 LAB — CMP (CANCER CENTER ONLY)
ALT: 26 U/L (ref 0–44)
AST: 21 U/L (ref 15–41)
Albumin: 3.6 g/dL (ref 3.5–5.0)
Alkaline Phosphatase: 34 U/L — ABNORMAL LOW (ref 38–126)
Anion gap: 3 — ABNORMAL LOW (ref 5–15)
BUN: 19 mg/dL (ref 8–23)
CO2: 28 mmol/L (ref 22–32)
Calcium: 9.7 mg/dL (ref 8.9–10.3)
Chloride: 107 mmol/L (ref 98–111)
Creatinine: 1.13 mg/dL (ref 0.61–1.24)
GFR, Estimated: >60 mL/min (ref >60)
Glucose, Bld: 102 mg/dL — ABNORMAL HIGH (ref 70–99)
Potassium: 4.1 mmol/L (ref 3.5–5.1)
Sodium: 138 mmol/L (ref 135–145)
Total Bilirubin: 0.4 mg/dL (ref 0.3–1.2)
Total Protein: 6.2 g/dL — ABNORMAL LOW (ref 6.5–8.1)

## 2022-03-14 MED ORDER — DEXAMETHASONE 4 MG PO TABS
20.0000 mg | ORAL_TABLET | Freq: Once | ORAL | Status: AC
  Administered 2022-03-14: 20 mg via ORAL
  Filled 2022-03-14: qty 5

## 2022-03-14 MED ORDER — BORTEZOMIB CHEMO SQ INJECTION 3.5 MG (2.5MG/ML)
1.5000 mg/m2 | Freq: Once | INTRAMUSCULAR | Status: AC
  Administered 2022-03-14: 3.5 mg via SUBCUTANEOUS
  Filled 2022-03-14: qty 1.4

## 2022-03-14 MED ORDER — ONDANSETRON HCL 8 MG PO TABS
8.0000 mg | ORAL_TABLET | Freq: Once | ORAL | Status: AC
  Administered 2022-03-14: 8 mg via ORAL
  Filled 2022-03-14: qty 1

## 2022-03-14 NOTE — Patient Instructions (Signed)
[facility]   ?Discharge Instructions: ?Thank you for choosing [facility] to provide your oncology and hematology care.  ? ?If you have a lab appointment with the Cancer Center, please go directly to the Cancer Center and check in at the registration area. ?  ?Wear comfortable clothing and clothing appropriate for easy access to any Portacath or PICC line.  ? ?We strive to give you quality time with your provider. You may need to reschedule your appointment if you arrive late (15 or more minutes).  Arriving late affects you and other patients whose appointments are after yours.  Also, if you miss three or more appointments without notifying the office, you may be dismissed from the clinic at the provider?s discretion.    ?  ?For prescription refill requests, have your pharmacy contact our office and allow 72 hours for refills to be completed.   ? ?Today you received the following chemotherapy and/or immunotherapy agents: bortezomib    ?  ?To help prevent nausea and vomiting after your treatment, we encourage you to take your nausea medication as directed. ? ?BELOW ARE SYMPTOMS THAT SHOULD BE REPORTED IMMEDIATELY: ?*FEVER GREATER THAN 100.4 F (38 ?C) OR HIGHER ?*CHILLS OR SWEATING ?*NAUSEA AND VOMITING THAT IS NOT CONTROLLED WITH YOUR NAUSEA MEDICATION ?*UNUSUAL SHORTNESS OF BREATH ?*UNUSUAL BRUISING OR BLEEDING ?*URINARY PROBLEMS (pain or burning when urinating, or frequent urination) ?*BOWEL PROBLEMS (unusual diarrhea, constipation, pain near the anus) ?TENDERNESS IN MOUTH AND THROAT WITH OR WITHOUT PRESENCE OF ULCERS (sore throat, sores in mouth, or a toothache) ?UNUSUAL RASH, SWELLING OR PAIN  ?UNUSUAL VAGINAL DISCHARGE OR ITCHING  ? ?Items with * indicate a potential emergency and should be followed up as soon as possible or go to the Emergency Department if any problems should occur. ? ?Please show the CHEMOTHERAPY ALERT CARD or IMMUNOTHERAPY ALERT CARD at check-in  to the Emergency Department and triage nurse. ? ?Should you have questions after your visit or need to cancel or reschedule your appointment, please contact [facility]  Dept: [phone]  and follow the prompts.  Office hours are 8:00 a.m. to 4:30 p.m. Monday - Friday. Please note that voicemails left after 4:00 p.m. may not be returned until the following business day.  We are closed weekends and major holidays. You have access to a nurse at all times for urgent questions. Please call the main number to the clinic Dept: [phone] and follow the prompts. ? ? ?For any non-urgent questions, you may also contact your provider using MyChart. We now offer e-Visits for anyone 18 and older to request care online for non-urgent symptoms. For details visit [email or web address]. ?  ?Also download the MyChart app! Go to the app store, search "MyChart", open the app, select [facility], and log in with your MyChart username and password. ? ?Due to Covid, a mask is required upon entering the hospital/clinic. If you do not have a mask, one will be given to you upon arrival. For doctor visits, patients may have 1 support person aged 18 or older with them. For treatment visits, patients cannot have anyone with them due to current Covid guidelines and our immunocompromised population.  ? ?

## 2022-03-14 NOTE — Progress Notes (Signed)
CBC reviewed with Dr. Irene Limbo. Plate 95 and ANC 1.0. OK to treat with Velcade Today per Dr. Irene Limbo ?

## 2022-03-17 ENCOUNTER — Encounter: Payer: Self-pay | Admitting: Hematology

## 2022-03-17 NOTE — Progress Notes (Signed)
. ? ? ?HEMATOLOGY/ONCOLOGY PHONE VISIT NOTE ? ?Date of Service: 03/17/2022 ? ? ?Patient Care Team: ?Nathan Agreste, MD as PCP - General (Family Medicine) ?Nathan Genera, MD as Consulting Physician (Hematology) ? ?CHIEF COMPLAINTS/PURPOSE OF CONSULTATION:  ?Toxicity check and continued evaluation and management of multiple myeloma ? ?HISTORY OF PRESENTING ILLNESS:  ?Please see previous note for details of initial presentation ? ?INTERVAL HISTORY ?Mr. Nathan Martinez is here for continued evaluation and management of his multiple myeloma and to start second cycle of weekly Velcade. ?He notes no signs or symptoms of neuropathy. ?Notes mild jitteriness with dexamethasone but is able to sleep at night and is not having any other significant issues. ?No issues with Revlimid 15 mg 2 weeks on 1 week off.  No diarrhea no skin rashes no significant fatigue. ?No new bone pains. ?Labs done were reviewed with the patient in details. ? ?MEDICAL HISTORY:  ?Intermittent hypercalcemia - primary hyperparathyroidism status post parathyroidectomy in 2019 ?HTN ?HLD ?Glaucoma ?History of Helicobacter pylori infection previously treated ?Chronic leukopenia/neutropenia ?History of present iron deficiency anemia ? ?SURGICAL HISTORY: ? ?Rt TKA 2018 ?Parathyroidectomy 2019 ?B/l cataracts sx 2021 ? ?SOCIAL HISTORY: ?Social History  ? ?Socioeconomic History  ? Marital status: Married  ?  Spouse name: Not on file  ? Number of children: Not on file  ? Years of education: Not on file  ? Highest education level: Not on file  ?Occupational History  ? Not on file  ?Tobacco Use  ? Smoking status: Never  ? Smokeless tobacco: Never  ?Vaping Use  ? Vaping Use: Never used  ?Substance and Sexual Activity  ? Alcohol use: Not Currently  ? Drug use: Never  ? Sexual activity: Not Currently  ?  Birth control/protection: None  ?Other Topics Concern  ? Not on file  ?Social History Narrative  ? Not on file  ? ?Social Determinants of Health  ? ?Financial Resource  Strain: Not on file  ?Food Insecurity: Not on file  ?Transportation Needs: Not on file  ?Physical Activity: Not on file  ?Stress: Not on file  ?Social Connections: Not on file  ?Intimate Partner Violence: Not on file  ?Ex smoker (quit 1976) ?Ex secret service agent ?ETOH social use ? ?FAMILY HISTORY: ?Sister - uterine/ovarian cancer 34's  ?Mother-- lived into her 62's ?Father - died in 39's - fall/trauma ? ?ALLERGIES:  has No Known Allergies. ? ?MEDICATIONS:  ? ?Krill oil ?Olmesartan hydrochlorothiazide 20-12.5 mg ?Ferrex 150 mg p.o. daily ?Aspirin 81 mg p.o. daily ?Atorvastatin 20 mg p.o. daily ?Multivitamin 1 tablet p.o. daily ?Ergocalciferol 50,000 units once weekly ? ? ?REVIEW OF SYSTEMS:   ?10 Point review of Systems was done is negative except as noted above. ? ?PHYSICAL EXAMINATION: ?.BP (!) 145/64   Pulse 69   Temp (!) 97.5 ?F (36.4 ?C)   Resp 20   Wt 229 lb 1.6 oz (103.9 kg)   SpO2 100%   BMI 30.23 kg/m?  ?Telemedicine appointment ? ?LABORATORY DATA:  ?I have reviewed the data as listed ? ?. ? ?  Latest Ref Rng & Units 03/14/2022  ?  1:07 PM 03/07/2022  ?  1:56 PM 02/28/2022  ?  1:41 PM  ?CBC  ?WBC 4.0 - 10.5 K/uL 2.1   2.7   2.3    ?Hemoglobin 13.0 - 17.0 g/dL 11.3   11.9   12.1    ?Hematocrit 39.0 - 52.0 % 34.4   36.0   36.4    ?Platelets 150 - 400 K/uL  95   122   194    ? ?.CBC ?   ?Component Value Date/Time  ? WBC 2.1 (L) 03/14/2022 1307  ? WBC 1.8 (L) 01/28/2022 8469  ? RBC 3.80 (L) 03/14/2022 1307  ? HGB 11.3 (L) 03/14/2022 1307  ? HCT 34.4 (L) 03/14/2022 1307  ? PLT 95 (L) 03/14/2022 1307  ? MCV 90.5 03/14/2022 1307  ? MCH 29.7 03/14/2022 1307  ? MCHC 32.8 03/14/2022 1307  ? RDW 13.7 03/14/2022 1307  ? LYMPHSABS 0.7 03/14/2022 1307  ? MONOABS 0.3 03/14/2022 1307  ? EOSABS 0.1 03/14/2022 1307  ? BASOSABS 0.0 03/14/2022 1307  ? ? ?. ? ?  Latest Ref Rng & Units 03/14/2022  ?  1:07 PM 03/07/2022  ?  1:56 PM 02/28/2022  ?  1:41 PM  ?CMP  ?Glucose 70 - 99 mg/dL 102   83   108    ?BUN 8 - 23 mg/dL _0 ?Creatinine 0.61 - 1.24 mg/dL 1.13   1.18   1.14    ?Sodium 135 - 145 mmol/L 138   134   137    ?Potassium 3.5 - 5.1 mmol/L 4.1   4.2   4.4    ?Chloride 98 - 111 mmol/L 107   104   104    ?CO2 22 - 32 mmol/L _1 ?Calcium 8.9 - 10.3 mg/dL 9.7   9.9   10.3    ?Total Protein 6.5 - 8.1 g/dL 6.2   6.9   7.2    ?Total Bilirubin 0.3 - 1.2 mg/dL 0.4   0.7   0.5    ?Alkaline Phos 38 - 126 U/L 34   37   40    ?AST 15 - 41 U/L _2 ?ALT 0 - 44 U/L _3 ? ? ? ? ? ? ?RADIOGRAPHIC STUDIES: ? ? ? ?ASSESSMENT & PLAN:  ? 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 ? 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 ? ?3) intermittent hypercalcemia. - resolved ?- which has resolved since discontinuation of hydrochlorothiazide. ? ? ?PLAN ?-Labs done today were reviewed in detail with the patient ?-He has some mild anemia leukopenia and thrombocytopenia related to his Velcade and Revlimid. ?-No other significant treatment related toxicities from the Velcade and Revlimid at this time. ?-Continue weekly Velcade at the current dose ?-We will plan to switch Revlimid to 15 mg 3 weeks on 1 week off and if tolerated to the full dose of 25 mg 3 weeks on 1 week off. ?-Repeat myeloma labs next week monthly. ?-We will discuss with patient regarding starting bone directed therapies [Zometa] after initial tolerance of induction treatment. ?-Continue vitamin D at least 2000 units daily. ? ?Follow-up ?-Please schedule remaining cycle 2 and cycle 3 of week leave Velcade with labs as per  orders. ?-MD visit in 4 weeks with cycle 3-day 1 ? ?The total time spent in the appointment was 25 minutes*. ? ?All of the patient's questions  were answered with apparent satisfaction. The patient knows to call the clinic with any problems, questions or concerns. ? ? ?Sullivan Lone MD MS AAHIVMS [facility] ?Hematology/Oncology Physician ?[place] ? ?.*Total Encounter Time as defined by the Centers for Medicare and Medicaid Services includes, in addition to the face-to-face time of a patient visit (documented in the note above) non-face-to-face time: obtaining and reviewing outside history, ordering and reviewing medications, tests or procedures, care coordination (communications with other health care professionals or caregivers) and documentation in the medical record. ? ? ?

## 2022-03-21 ENCOUNTER — Inpatient Hospital Stay: Payer: Medicare Other

## 2022-03-21 VITALS — BP 145/63 | HR 66 | Temp 98.6°F | Resp 17 | Wt 228.0 lb

## 2022-03-21 DIAGNOSIS — C9000 Multiple myeloma not having achieved remission: Secondary | ICD-10-CM

## 2022-03-21 DIAGNOSIS — Z7189 Other specified counseling: Secondary | ICD-10-CM

## 2022-03-21 DIAGNOSIS — Z5112 Encounter for antineoplastic immunotherapy: Secondary | ICD-10-CM | POA: Diagnosis not present

## 2022-03-21 LAB — CBC WITH DIFFERENTIAL/PLATELET
Abs Immature Granulocytes: 0 10*3/uL (ref 0.00–0.07)
Basophils Absolute: 0 10*3/uL (ref 0.0–0.1)
Basophils Relative: 1 %
Eosinophils Absolute: 0 10*3/uL (ref 0.0–0.5)
Eosinophils Relative: 2 %
HCT: 36.6 % — ABNORMAL LOW (ref 39.0–52.0)
Hemoglobin: 12.1 g/dL — ABNORMAL LOW (ref 13.0–17.0)
Immature Granulocytes: 0 %
Lymphocytes Relative: 37 %
Lymphs Abs: 0.7 10*3/uL (ref 0.7–4.0)
MCH: 30 pg (ref 26.0–34.0)
MCHC: 33.1 g/dL (ref 30.0–36.0)
MCV: 90.6 fL (ref 80.0–100.0)
Monocytes Absolute: 0.3 10*3/uL (ref 0.1–1.0)
Monocytes Relative: 13 %
Neutro Abs: 0.9 10*3/uL — ABNORMAL LOW (ref 1.7–7.7)
Neutrophils Relative %: 47 %
Platelets: 149 10*3/uL — ABNORMAL LOW (ref 150–400)
RBC: 4.04 MIL/uL — ABNORMAL LOW (ref 4.22–5.81)
RDW: 13.8 % (ref 11.5–15.5)
WBC: 1.9 10*3/uL — ABNORMAL LOW (ref 4.0–10.5)
nRBC: 0 % (ref 0.0–0.2)

## 2022-03-21 LAB — CMP (CANCER CENTER ONLY)
ALT: 30 U/L (ref 0–44)
AST: 26 U/L (ref 15–41)
Albumin: 3.9 g/dL (ref 3.5–5.0)
Alkaline Phosphatase: 35 U/L — ABNORMAL LOW (ref 38–126)
Anion gap: 1 — ABNORMAL LOW (ref 5–15)
BUN: 15 mg/dL (ref 8–23)
CO2: 32 mmol/L (ref 22–32)
Calcium: 10.1 mg/dL (ref 8.9–10.3)
Chloride: 105 mmol/L (ref 98–111)
Creatinine: 1.09 mg/dL (ref 0.61–1.24)
GFR, Estimated: >60 mL/min (ref >60)
Glucose, Bld: 117 mg/dL — ABNORMAL HIGH (ref 70–99)
Potassium: 4.1 mmol/L (ref 3.5–5.1)
Sodium: 138 mmol/L (ref 135–145)
Total Bilirubin: 0.8 mg/dL (ref 0.3–1.2)
Total Protein: 6.6 g/dL (ref 6.5–8.1)

## 2022-03-21 MED ORDER — DEXAMETHASONE 4 MG PO TABS
20.0000 mg | ORAL_TABLET | Freq: Once | ORAL | Status: AC
  Administered 2022-03-21: 20 mg via ORAL
  Filled 2022-03-21: qty 5

## 2022-03-21 MED ORDER — ONDANSETRON HCL 8 MG PO TABS
8.0000 mg | ORAL_TABLET | Freq: Once | ORAL | Status: AC
  Administered 2022-03-21: 8 mg via ORAL
  Filled 2022-03-21: qty 1

## 2022-03-21 MED ORDER — BORTEZOMIB CHEMO SQ INJECTION 3.5 MG (2.5MG/ML)
1.5000 mg/m2 | Freq: Once | INTRAMUSCULAR | Status: AC
  Administered 2022-03-21: 3.5 mg via SUBCUTANEOUS
  Filled 2022-03-21: qty 1.4

## 2022-03-21 NOTE — Progress Notes (Signed)
Ok to treat with ANC of 0.9 per Dr Irene Limbo.  ?

## 2022-03-26 LAB — MULTIPLE MYELOMA PANEL, SERUM
Albumin SerPl Elph-Mcnc: 3.5 g/dL (ref 2.9–4.4)
Albumin/Glob SerPl: 1.4 (ref 0.7–1.7)
Alpha 1: 0.2 g/dL (ref 0.0–0.4)
Alpha2 Glob SerPl Elph-Mcnc: 0.5 g/dL (ref 0.4–1.0)
B-Globulin SerPl Elph-Mcnc: 0.7 g/dL (ref 0.7–1.3)
Gamma Glob SerPl Elph-Mcnc: 1.1 g/dL (ref 0.4–1.8)
Globulin, Total: 2.6 g/dL (ref 2.2–3.9)
IgA: 42 mg/dL — ABNORMAL LOW (ref 61–437)
IgG (Immunoglobin G), Serum: 1328 mg/dL (ref 603–1613)
IgM (Immunoglobulin M), Srm: 13 mg/dL — ABNORMAL LOW (ref 15–143)
M Protein SerPl Elph-Mcnc: 0.8 g/dL — ABNORMAL HIGH
Total Protein ELP: 6.1 g/dL (ref 6.0–8.5)

## 2022-03-28 ENCOUNTER — Inpatient Hospital Stay: Payer: Medicare Other

## 2022-03-28 ENCOUNTER — Inpatient Hospital Stay: Payer: Medicare Other | Attending: Hematology

## 2022-03-28 ENCOUNTER — Other Ambulatory Visit: Payer: Self-pay

## 2022-03-28 VITALS — BP 137/86 | HR 64 | Temp 97.6°F | Resp 17

## 2022-03-28 DIAGNOSIS — C9000 Multiple myeloma not having achieved remission: Secondary | ICD-10-CM

## 2022-03-28 DIAGNOSIS — Z7189 Other specified counseling: Secondary | ICD-10-CM

## 2022-03-28 DIAGNOSIS — Z5112 Encounter for antineoplastic immunotherapy: Secondary | ICD-10-CM | POA: Insufficient documentation

## 2022-03-28 LAB — CBC WITH DIFFERENTIAL/PLATELET
Abs Immature Granulocytes: 0.01 10*3/uL (ref 0.00–0.07)
Basophils Absolute: 0 10*3/uL (ref 0.0–0.1)
Basophils Relative: 1 %
Eosinophils Absolute: 0.1 10*3/uL (ref 0.0–0.5)
Eosinophils Relative: 3 %
HCT: 37.9 % — ABNORMAL LOW (ref 39.0–52.0)
Hemoglobin: 12.3 g/dL — ABNORMAL LOW (ref 13.0–17.0)
Immature Granulocytes: 0 %
Lymphocytes Relative: 27 %
Lymphs Abs: 0.8 10*3/uL (ref 0.7–4.0)
MCH: 29.4 pg (ref 26.0–34.0)
MCHC: 32.5 g/dL (ref 30.0–36.0)
MCV: 90.5 fL (ref 80.0–100.0)
Monocytes Absolute: 0.4 10*3/uL (ref 0.1–1.0)
Monocytes Relative: 14 %
Neutro Abs: 1.6 10*3/uL — ABNORMAL LOW (ref 1.7–7.7)
Neutrophils Relative %: 55 %
Platelets: 186 10*3/uL (ref 150–400)
RBC: 4.19 MIL/uL — ABNORMAL LOW (ref 4.22–5.81)
RDW: 13.9 % (ref 11.5–15.5)
WBC: 2.8 10*3/uL — ABNORMAL LOW (ref 4.0–10.5)
nRBC: 0 % (ref 0.0–0.2)

## 2022-03-28 LAB — CMP (CANCER CENTER ONLY)
ALT: 36 U/L (ref 0–44)
AST: 28 U/L (ref 15–41)
Albumin: 3.9 g/dL (ref 3.5–5.0)
Alkaline Phosphatase: 36 U/L — ABNORMAL LOW (ref 38–126)
Anion gap: 3 — ABNORMAL LOW (ref 5–15)
BUN: 18 mg/dL (ref 8–23)
CO2: 28 mmol/L (ref 22–32)
Calcium: 10.1 mg/dL (ref 8.9–10.3)
Chloride: 107 mmol/L (ref 98–111)
Creatinine: 1.13 mg/dL (ref 0.61–1.24)
GFR, Estimated: >60 mL/min (ref >60)
Glucose, Bld: 101 mg/dL — ABNORMAL HIGH (ref 70–99)
Potassium: 4.2 mmol/L (ref 3.5–5.1)
Sodium: 138 mmol/L (ref 135–145)
Total Bilirubin: 0.8 mg/dL (ref 0.3–1.2)
Total Protein: 6.7 g/dL (ref 6.5–8.1)

## 2022-03-28 MED ORDER — ONDANSETRON HCL 8 MG PO TABS
8.0000 mg | ORAL_TABLET | Freq: Once | ORAL | Status: AC
  Administered 2022-03-28: 8 mg via ORAL
  Filled 2022-03-28: qty 1

## 2022-03-28 MED ORDER — BORTEZOMIB CHEMO SQ INJECTION 3.5 MG (2.5MG/ML)
1.5000 mg/m2 | Freq: Once | INTRAMUSCULAR | Status: AC
  Administered 2022-03-28: 3.5 mg via SUBCUTANEOUS
  Filled 2022-03-28: qty 1.4

## 2022-03-28 MED ORDER — DEXAMETHASONE 4 MG PO TABS
20.0000 mg | ORAL_TABLET | Freq: Once | ORAL | Status: AC
  Administered 2022-03-28: 20 mg via ORAL
  Filled 2022-03-28: qty 5

## 2022-03-28 NOTE — Patient Instructions (Signed)
[facility]  Discharge Instructions: Thank you for choosing [facility] to provide your oncology and hematology care.   If you have a lab appointment with the Cancer Center, please go directly to the Cancer Center and check in at the registration area.   Wear comfortable clothing and clothing appropriate for easy access to any Portacath or PICC line.   We strive to give you quality time with your provider. You may need to reschedule your appointment if you arrive late (15 or more minutes).  Arriving late affects you and other patients whose appointments are after yours.  Also, if you miss three or more appointments without notifying the office, you may be dismissed from the clinic at the provider's discretion.      For prescription refill requests, have your pharmacy contact our office and allow 72 hours for refills to be completed.    Today you received the following chemotherapy and/or immunotherapy agent: Velcade      To help prevent nausea and vomiting after your treatment, we encourage you to take your nausea medication as directed.  BELOW ARE SYMPTOMS THAT SHOULD BE REPORTED IMMEDIATELY: *FEVER GREATER THAN 100.4 F (38 C) OR HIGHER *CHILLS OR SWEATING *NAUSEA AND VOMITING THAT IS NOT CONTROLLED WITH YOUR NAUSEA MEDICATION *UNUSUAL SHORTNESS OF BREATH *UNUSUAL BRUISING OR BLEEDING *URINARY PROBLEMS (pain or burning when urinating, or frequent urination) *BOWEL PROBLEMS (unusual diarrhea, constipation, pain near the anus) TENDERNESS IN MOUTH AND THROAT WITH OR WITHOUT PRESENCE OF ULCERS (sore throat, sores in mouth, or a toothache) UNUSUAL RASH, SWELLING OR PAIN  UNUSUAL VAGINAL DISCHARGE OR ITCHING   Items with * indicate a potential emergency and should be followed up as soon as possible or go to the Emergency Department if any problems should occur.  Please show the CHEMOTHERAPY ALERT CARD or IMMUNOTHERAPY ALERT CARD at check-in to the  Emergency Department and triage nurse.  Should you have questions after your visit or need to cancel or reschedule your appointment, please contact [facility]  Dept: [phone]  and follow the prompts.  Office hours are 8:00 a.m. to 4:30 p.m. Monday - Friday. Please note that voicemails left after 4:00 p.m. may not be returned until the following business day.  We are closed weekends and major holidays. You have access to a nurse at all times for urgent questions. Please call the main number to the clinic Dept: [phone] and follow the prompts.   For any non-urgent questions, you may also contact your provider using MyChart. We now offer e-Visits for anyone 18 and older to request care online for non-urgent symptoms. For details visit [email or web address].   Also download the MyChart app! Go to the app store, search "MyChart", open the app, select [facility], and log in with your MyChart username and password.  Due to Covid, a mask is required upon entering the hospital/clinic. If you do not have a mask, one will be given to you upon arrival. For doctor visits, patients may have 1 support person aged 18 or older with them. For treatment visits, patients cannot have anyone with them due to current Covid guidelines and our immunocompromised population.  

## 2022-03-31 ENCOUNTER — Other Ambulatory Visit: Payer: Self-pay | Admitting: Hematology

## 2022-03-31 ENCOUNTER — Other Ambulatory Visit: Payer: Self-pay

## 2022-03-31 DIAGNOSIS — C9000 Multiple myeloma not having achieved remission: Secondary | ICD-10-CM

## 2022-03-31 DIAGNOSIS — Z7189 Other specified counseling: Secondary | ICD-10-CM

## 2022-03-31 MED ORDER — LENALIDOMIDE 15 MG PO CAPS: 15.0000 mg | ORAL_CAPSULE | Freq: Every day | ORAL | 0 refills | Status: DC

## 2022-04-03 ENCOUNTER — Other Ambulatory Visit: Payer: Self-pay | Admitting: Urology

## 2022-04-04 ENCOUNTER — Inpatient Hospital Stay: Payer: Medicare Other

## 2022-04-04 ENCOUNTER — Other Ambulatory Visit: Payer: Self-pay | Admitting: Urology

## 2022-04-04 ENCOUNTER — Other Ambulatory Visit: Payer: Self-pay

## 2022-04-04 VITALS — BP 151/75 | HR 62 | Temp 98.6°F | Resp 16 | Ht 73.0 in | Wt 227.0 lb

## 2022-04-04 DIAGNOSIS — Z7189 Other specified counseling: Secondary | ICD-10-CM

## 2022-04-04 DIAGNOSIS — C9000 Multiple myeloma not having achieved remission: Secondary | ICD-10-CM

## 2022-04-04 DIAGNOSIS — Z5112 Encounter for antineoplastic immunotherapy: Secondary | ICD-10-CM | POA: Diagnosis not present

## 2022-04-04 DIAGNOSIS — R972 Elevated prostate specific antigen [PSA]: Secondary | ICD-10-CM

## 2022-04-04 LAB — CBC WITH DIFFERENTIAL/PLATELET
Abs Immature Granulocytes: 0.01 10*3/uL (ref 0.00–0.07)
Basophils Absolute: 0 10*3/uL (ref 0.0–0.1)
Basophils Relative: 1 %
Eosinophils Absolute: 0.1 10*3/uL (ref 0.0–0.5)
Eosinophils Relative: 3 %
HCT: 36.7 % — ABNORMAL LOW (ref 39.0–52.0)
Hemoglobin: 12.3 g/dL — ABNORMAL LOW (ref 13.0–17.0)
Immature Granulocytes: 0 %
Lymphocytes Relative: 33 %
Lymphs Abs: 1.2 10*3/uL (ref 0.7–4.0)
MCH: 29.9 pg (ref 26.0–34.0)
MCHC: 33.5 g/dL (ref 30.0–36.0)
MCV: 89.1 fL (ref 80.0–100.0)
Monocytes Absolute: 0.5 10*3/uL (ref 0.1–1.0)
Monocytes Relative: 15 %
Neutro Abs: 1.8 10*3/uL (ref 1.7–7.7)
Neutrophils Relative %: 48 %
Platelets: 117 10*3/uL — ABNORMAL LOW (ref 150–400)
RBC: 4.12 MIL/uL — ABNORMAL LOW (ref 4.22–5.81)
RDW: 13.5 % (ref 11.5–15.5)
WBC: 3.7 10*3/uL — ABNORMAL LOW (ref 4.0–10.5)
nRBC: 0 % (ref 0.0–0.2)

## 2022-04-04 LAB — CMP (CANCER CENTER ONLY)
ALT: 34 U/L (ref 0–44)
AST: 29 U/L (ref 15–41)
Albumin: 3.7 g/dL (ref 3.5–5.0)
Alkaline Phosphatase: 35 U/L — ABNORMAL LOW (ref 38–126)
Anion gap: 3 — ABNORMAL LOW (ref 5–15)
BUN: 21 mg/dL (ref 8–23)
CO2: 29 mmol/L (ref 22–32)
Calcium: 9.9 mg/dL (ref 8.9–10.3)
Chloride: 108 mmol/L (ref 98–111)
Creatinine: 1.08 mg/dL (ref 0.61–1.24)
GFR, Estimated: >60 mL/min (ref >60)
Glucose, Bld: 105 mg/dL — ABNORMAL HIGH (ref 70–99)
Potassium: 4.1 mmol/L (ref 3.5–5.1)
Sodium: 140 mmol/L (ref 135–145)
Total Bilirubin: 0.7 mg/dL (ref 0.3–1.2)
Total Protein: 6.2 g/dL — ABNORMAL LOW (ref 6.5–8.1)

## 2022-04-04 MED ORDER — ONDANSETRON HCL 8 MG PO TABS
8.0000 mg | ORAL_TABLET | Freq: Once | ORAL | Status: AC
  Administered 2022-04-04: 8 mg via ORAL
  Filled 2022-04-04: qty 1

## 2022-04-04 MED ORDER — BORTEZOMIB CHEMO SQ INJECTION 3.5 MG (2.5MG/ML)
1.5000 mg/m2 | Freq: Once | INTRAMUSCULAR | Status: AC
  Administered 2022-04-04: 3.5 mg via SUBCUTANEOUS
  Filled 2022-04-04: qty 1.4

## 2022-04-04 MED ORDER — DEXAMETHASONE 4 MG PO TABS
20.0000 mg | ORAL_TABLET | Freq: Once | ORAL | Status: AC
  Administered 2022-04-04: 20 mg via ORAL

## 2022-04-04 NOTE — Patient Instructions (Signed)
Bortezomib injection What is this medication? BORTEZOMIB (bor TEZ oh mib) targets proteins in cancer cells and stops the cancer cells from growing. It treats multiple myeloma and mantle cell lymphoma. This medicine may be used for other purposes; ask your health care provider or pharmacist if you have questions. COMMON BRAND NAME(S): Velcade What should I tell my care team before I take this medication? They need to know if you have any of these conditions: dehydration diabetes (high blood sugar) heart disease liver disease tingling of the fingers or toes or other nerve disorder an unusual or allergic reaction to bortezomib, mannitol, boron, other medicines, foods, dyes, or preservatives pregnant or trying to get pregnant breast-feeding How should I use this medication? This medicine is injected into a vein or under the skin. It is given by a health care provider in a hospital or clinic setting. Talk to your health care provider about the use of this medicine in children. Special care may be needed. Overdosage: If you think you have taken too much of this medicine contact a poison control center or emergency room at once. NOTE: This medicine is only for you. Do not share this medicine with others. What if I miss a dose? Keep appointments for follow-up doses. It is important not to miss your dose. Call your health care provider if you are unable to keep an appointment. What may interact with this medication? This medicine may interact with the following medications: ketoconazole rifampin This list may not describe all possible interactions. Give your health care provider a list of all the medicines, herbs, non-prescription drugs, or dietary supplements you use. Also tell them if you smoke, drink alcohol, or use illegal drugs. Some items may interact with your medicine. What should I watch for while using this medication? Your condition will be monitored carefully while you are receiving  this medicine. You may need blood work done while you are taking this medicine. You may get drowsy or dizzy. Do not drive, use machinery, or do anything that needs mental alertness until you know how this medicine affects you. Do not stand up or sit up quickly, especially if you are an older patient. This reduces the risk of dizzy or fainting spells This medicine may increase your risk of getting an infection. Call your health care provider for advice if you get a fever, chills, sore throat, or other symptoms of a cold or flu. Do not treat yourself. Try to avoid being around people who are sick. Check with your health care provider if you have severe diarrhea, nausea, and vomiting, or if you sweat a lot. The loss of too much body fluid may make it dangerous for you to take this medicine. Do not become pregnant while taking this medicine or for 7 months after stopping it. Women should inform their health care provider if they wish to become pregnant or think they might be pregnant. Men should not father a child while taking this medicine and for 4 months after stopping it. There is a potential for serious harm to an unborn child. Talk to your health care provider for more information. Do not breast-feed an infant while taking this medicine or for 2 months after stopping it. This medicine may make it more difficult to get pregnant or father a child. Talk to your health care provider if you are concerned about your fertility. What side effects may I notice from receiving this medication? Side effects that you should report to your doctor or health   care professional as soon as possible: allergic reactions (skin rash; itching or hives; swelling of the face, lips, or tongue) bleeding (bloody or black, tarry stools; red or dark brown urine; spitting up blood or brown material that looks like coffee grounds; red spots on the skin; unusual bruising or bleeding from the eye, gums, or nose) blurred vision or changes  in vision confusion constipation headache heart failure (trouble breathing; fast, irregular heartbeat; sudden weight gain; swelling of the ankles, feet, hands) infection (fever, chills, cough, sore throat, pain or trouble passing urine) lack or loss of appetite liver injury (dark yellow or brown urine; general ill feeling or flu-like symptoms; loss of appetite, right upper belly pain; yellowing of the eyes or skin) low blood pressure (dizziness; feeling faint or lightheaded, falls; unusually weak or tired) muscle cramps pain, redness, or irritation at site where injected pain, tingling, numbness in the hands or feet seizures trouble breathing unusual bruising or bleeding Side effects that usually do not require medical attention (report to your doctor or health care professional if they continue or are bothersome): diarrhea nausea stomach pain trouble sleeping vomiting This list may not describe all possible side effects. Call your doctor for medical advice about side effects. You may report side effects to FDA at 1-800-FDA-1088. Where should I keep my medication? This medicine is given in a hospital or clinic. It will not be stored at home. NOTE: This sheet is a summary. It may not cover all possible information. If you have questions about this medicine, talk to your doctor, pharmacist, or health care provider.  2023 Elsevier/Gold Standard (2020-11-01 00:00:00)  

## 2022-04-10 ENCOUNTER — Inpatient Hospital Stay: Payer: Medicare Other

## 2022-04-10 ENCOUNTER — Inpatient Hospital Stay (HOSPITAL_BASED_OUTPATIENT_CLINIC_OR_DEPARTMENT_OTHER): Payer: Medicare Other | Admitting: Hematology

## 2022-04-10 ENCOUNTER — Other Ambulatory Visit: Payer: Self-pay

## 2022-04-10 DIAGNOSIS — Z5112 Encounter for antineoplastic immunotherapy: Secondary | ICD-10-CM | POA: Diagnosis not present

## 2022-04-10 DIAGNOSIS — C9000 Multiple myeloma not having achieved remission: Secondary | ICD-10-CM

## 2022-04-10 DIAGNOSIS — Z7189 Other specified counseling: Secondary | ICD-10-CM

## 2022-04-10 LAB — CMP (CANCER CENTER ONLY)
ALT: 31 U/L (ref 0–44)
AST: 28 U/L (ref 15–41)
Albumin: 3.9 g/dL (ref 3.5–5.0)
Alkaline Phosphatase: 39 U/L (ref 38–126)
Anion gap: 2 — ABNORMAL LOW (ref 5–15)
BUN: 18 mg/dL (ref 8–23)
CO2: 30 mmol/L (ref 22–32)
Calcium: 10 mg/dL (ref 8.9–10.3)
Chloride: 106 mmol/L (ref 98–111)
Creatinine: 1.06 mg/dL (ref 0.61–1.24)
GFR, Estimated: >60 mL/min (ref >60)
Glucose, Bld: 91 mg/dL (ref 70–99)
Potassium: 4.2 mmol/L (ref 3.5–5.1)
Sodium: 138 mmol/L (ref 135–145)
Total Bilirubin: 0.9 mg/dL (ref 0.3–1.2)
Total Protein: 6.5 g/dL (ref 6.5–8.1)

## 2022-04-10 LAB — CBC WITH DIFFERENTIAL/PLATELET
Abs Immature Granulocytes: 0 10*3/uL (ref 0.00–0.07)
Basophils Absolute: 0 10*3/uL (ref 0.0–0.1)
Basophils Relative: 0 %
Eosinophils Absolute: 0.1 10*3/uL (ref 0.0–0.5)
Eosinophils Relative: 4 %
HCT: 37.9 % — ABNORMAL LOW (ref 39.0–52.0)
Hemoglobin: 12.6 g/dL — ABNORMAL LOW (ref 13.0–17.0)
Immature Granulocytes: 0 %
Lymphocytes Relative: 41 %
Lymphs Abs: 1.3 10*3/uL (ref 0.7–4.0)
MCH: 29.5 pg (ref 26.0–34.0)
MCHC: 33.2 g/dL (ref 30.0–36.0)
MCV: 88.8 fL (ref 80.0–100.0)
Monocytes Absolute: 0.6 10*3/uL (ref 0.1–1.0)
Monocytes Relative: 21 %
Neutro Abs: 1 10*3/uL — ABNORMAL LOW (ref 1.7–7.7)
Neutrophils Relative %: 34 %
Platelets: 113 10*3/uL — ABNORMAL LOW (ref 150–400)
RBC: 4.27 MIL/uL (ref 4.22–5.81)
RDW: 13.8 % (ref 11.5–15.5)
WBC: 3.1 10*3/uL — ABNORMAL LOW (ref 4.0–10.5)
nRBC: 0 % (ref 0.0–0.2)

## 2022-04-10 MED ORDER — DEXAMETHASONE 4 MG PO TABS
20.0000 mg | ORAL_TABLET | Freq: Once | ORAL | Status: AC
  Administered 2022-04-10: 20 mg via ORAL
  Filled 2022-04-10: qty 5

## 2022-04-10 MED ORDER — LENALIDOMIDE 25 MG PO CAPS: 25.0000 mg | ORAL_CAPSULE | Freq: Every day | ORAL | 2 refills | Status: DC

## 2022-04-10 MED ORDER — ONDANSETRON HCL 8 MG PO TABS
8.0000 mg | ORAL_TABLET | Freq: Once | ORAL | Status: AC
  Administered 2022-04-10: 8 mg via ORAL
  Filled 2022-04-10: qty 1

## 2022-04-10 MED ORDER — BORTEZOMIB CHEMO SQ INJECTION 3.5 MG (2.5MG/ML)
1.5000 mg/m2 | Freq: Once | INTRAMUSCULAR | Status: AC
  Administered 2022-04-10: 3.5 mg via SUBCUTANEOUS
  Filled 2022-04-10: qty 1.4

## 2022-04-10 NOTE — Progress Notes (Signed)
Okay to treat with ANC 1.0 per Dr. Irene Limbo

## 2022-04-14 ENCOUNTER — Telehealth: Payer: Self-pay | Admitting: Hematology

## 2022-04-14 NOTE — Telephone Encounter (Signed)
Scheduled follow-up appointments per 5/18 los. Patient is aware.

## 2022-04-16 ENCOUNTER — Encounter: Payer: Self-pay | Admitting: Hematology

## 2022-04-16 DIAGNOSIS — C9000 Multiple myeloma not having achieved remission: Secondary | ICD-10-CM | POA: Insufficient documentation

## 2022-04-16 NOTE — Progress Notes (Signed)
[name]   HEMATOLOGY/ONCOLOGY CLINIC NOTE   Date of Service: .04/10/2022    Patient Care Team: [name], MD as PCP - General (Family Medicine) [name], MD as Consulting Physician (Hematology)  CHIEF COMPLAINTS/PURPOSE OF CONSULTATION:  Follow-up for continued evaluation and management of multiple myeloma  HISTORY OF PRESENTING ILLNESS:  Please see previous note for details of initial presentation  INTERVAL HISTORY Nathan Martinez is here for continued evaluation and management of multiple myeloma and follow-up prior to starting third cycle of RVD.   He notes no acute new symptoms since his last clinic visit. No concerns with neuropathy at this time.  No intolerance to Revlimid at 15 mg. No fevers no chills no night sweats.  No significant new fatigue. No other notable toxicities from his current treatment.  We talked about increasing his dose of Revlimid and patient is agreeable to this from the next cycle of treatment. We also discussed starting the patient on Zometa given his bone lesions and he is agreeable with this as well. Labs done today were reviewed in detail with the patient.  MEDICAL HISTORY:  Intermittent hypercalcemia - primary hyperparathyroidism status post parathyroidectomy in 2019 HTN HLD Glaucoma History of Helicobacter pylori infection previously treated Chronic leukopenia/neutropenia History of present iron deficiency anemia  SURGICAL HISTORY:  Rt TKA 2018 Parathyroidectomy 2019 B/l cataracts sx 2021  SOCIAL HISTORY: Social History   Socioeconomic History   Marital status: Married    Spouse name: Not on file   Number of children: Not on file   Years of education: Not on file   Highest education level: Not on file  Occupational History   Not on file  Tobacco Use   Smoking status: Never   Smokeless tobacco: Never  Vaping Use   Vaping Use: Never used  Substance and Sexual Activity   Alcohol use: Not Currently   Drug use: Never    Sexual activity: Not Currently    Birth control/protection: None  Other Topics Concern   Not on file  Social History Narrative   Not on file   Social Determinants of Health   Financial Resource Strain: Not on file  Food Insecurity: Not on file  Transportation Needs: Not on file  Physical Activity: Not on file  Stress: Not on file  Social Connections: Not on file  Intimate Partner Violence: Not on file  Ex smoker (quit 1976) 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  MEDICATIONS:   Krill oil Olmesartan hydrochlorothiazide 20-12.5 mg Ferrex 150 mg p.o. daily Aspirin 81 mg p.o. daily Atorvastatin 20 mg p.o. daily Multivitamin 1 tablet p.o. daily Ergocalciferol 50,000 units once weekly   REVIEW OF SYSTEMS:   10 Point review of Systems was done is negative except as noted above.  PHYSICAL EXAMINATION: .BP (!) 147/68   Pulse 68   Temp (!) 97.5 F (36.4 C)   Resp 20   Wt 227 lb 6.4 oz (103.1 kg)   SpO2 99%   BMI 30.00 kg/m  NAD GENERAL:alert, in no acute distress and comfortable SKIN: no acute rashes, no significant lesions EYES: conjunctiva are pink and non-injected, sclera anicteric OROPHARYNX: MMM, no exudates, no oropharyngeal erythema or ulceration NECK: supple, no JVD LYMPH:  no palpable lymphadenopathy in the cervical, axillary or inguinal regions LUNGS: clear to auscultation b/l with normal respiratory effort  HEART: regular rate & rhythm ABDOMEN:  normoactive bowel sounds , non tender, not distended. Extremity: no pedal edema PSYCH: alert & oriented x 3 with fluent speech NEURO: no focal motor/sensory deficits   LABORATORY DATA:  I have reviewed the data as listed  .    Latest Ref Rng & Units 04/10/2022    8:56 AM 04/04/2022    1:23 PM 03/28/2022   12:40 PM  CBC  WBC 4.0 - 10.5 K/uL 3.1   3.7    2.8    Hemoglobin 13.0 - 17.0 g/dL 12.6   12.3   12.3    Hematocrit 39.0 - 52.0 % 37.9   36.7   37.9    Platelets 150 - 400 K/uL 113   117   186     .CBC    Component Value Date/Time   WBC 3.1 (L) 04/10/2022 0856   RBC 4.27 04/10/2022 0856   HGB 12.6 (L) 04/10/2022 0856   HGB 11.3 (L) 03/14/2022 1307   HCT 37.9 (L) 04/10/2022 0856   PLT 113 (L) 04/10/2022 0856   PLT 95 (L) 03/14/2022 1307   MCV 88.8 04/10/2022 0856   MCH 29.5 04/10/2022 0856   MCHC 33.2 04/10/2022 0856   RDW 13.8 04/10/2022 0856   LYMPHSABS 1.3 04/10/2022 0856   MONOABS 0.6 04/10/2022 0856   EOSABS 0.1 04/10/2022 0856   BASOSABS 0.0 04/10/2022 0856    .    Latest Ref Rng & Units 04/10/2022    8:56 AM 04/04/2022    1:23 PM 03/28/2022   12:40 PM  CMP  Glucose 70 - 99 mg/dL 91   105   101    BUN 8 - 23 mg/dL 18   21   18     Creatinine 0.61 - 1.24 mg/dL 1.06   1.08   1.13    Sodium 135 - 145 mmol/L 138   140   138    Potassium 3.5 - 5.1 mmol/L 4.2   4.1   4.2    Chloride 98 - 111 mmol/L 106   108   107    CO2 22 - 32 mmol/L 30   29   28     Calcium 8.9 - 10.3 mg/dL 10.0   9.9   10.1    Total Protein 6.5 - 8.1 g/dL 6.5   6.2   6.7    Total Bilirubin 0.3 - 1.2 mg/dL 0.9   0.7   0.8    Alkaline Phos 38 - 126 U/L 39   35   36    AST 15 - 41 U/L 28   29   28     ALT 0 - 44 U/L 31   34   36          RADIOGRAPHIC STUDIES:    ASSESSMENT & PLAN:   76 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  3) intermittent hypercalcemia. - resolved -  which has resolved since discontinuation of hydrochlorothiazide.   PLAN -Labs done today were reviewed in detail with the patient as noted above -Patient notes no notable toxicities from his current RVD  regimen. -Appropriate to proceed with next cycle 3 of RVD. -We will plan to increase his Revlimid to 25 mg 3 weeks on 1 week off from his next cycle of treatment. -We will start the patient on monthly Zometa -Continue vitamin D at least 2000 units daily.  Follow-up -Please add Zometa IV start in 1 week (to be done every 4 weeks x 6 doses) -Please schedule next 2 cycles of Velcade with labs as per orders -MD visit in 4 weeks with cycle 4-day 1  The total time spent in the appointment was 30 minutes*.  All of the patient's questions were answered with apparent satisfaction. The patient knows to call the clinic with any problems, questions or concerns.   Nathan Lone MD MS AAHIVMS [facility] Hematology/Oncology Physician [facility]  .*Total Encounter Time as defined by the Centers for Medicare and Medicaid Services includes, in addition to the face-to-face time of a patient visit (documented in the note above) non-face-to-face time: obtaining and reviewing outside history, ordering and reviewing medications, tests or procedures, care coordination (communications with other health care professionals or caregivers) and documentation in the medical record.

## 2022-04-18 ENCOUNTER — Inpatient Hospital Stay: Payer: Medicare Other

## 2022-04-18 ENCOUNTER — Other Ambulatory Visit: Payer: Self-pay

## 2022-04-18 VITALS — BP 151/79 | HR 63 | Temp 98.9°F | Resp 17 | Wt 227.0 lb

## 2022-04-18 DIAGNOSIS — Z5112 Encounter for antineoplastic immunotherapy: Secondary | ICD-10-CM | POA: Diagnosis not present

## 2022-04-18 DIAGNOSIS — C9000 Multiple myeloma not having achieved remission: Secondary | ICD-10-CM

## 2022-04-18 DIAGNOSIS — Z7189 Other specified counseling: Secondary | ICD-10-CM

## 2022-04-18 LAB — CBC WITH DIFFERENTIAL/PLATELET
Abs Immature Granulocytes: 0 10*3/uL (ref 0.00–0.07)
Basophils Absolute: 0 10*3/uL (ref 0.0–0.1)
Basophils Relative: 1 %
Eosinophils Absolute: 0.2 10*3/uL (ref 0.0–0.5)
Eosinophils Relative: 7 %
HCT: 36.7 % — ABNORMAL LOW (ref 39.0–52.0)
Hemoglobin: 12.1 g/dL — ABNORMAL LOW (ref 13.0–17.0)
Immature Granulocytes: 0 %
Lymphocytes Relative: 36 %
Lymphs Abs: 1.2 10*3/uL (ref 0.7–4.0)
MCH: 28.9 pg (ref 26.0–34.0)
MCHC: 33 g/dL (ref 30.0–36.0)
MCV: 87.6 fL (ref 80.0–100.0)
Monocytes Absolute: 0.4 10*3/uL (ref 0.1–1.0)
Monocytes Relative: 12 %
Neutro Abs: 1.4 10*3/uL — ABNORMAL LOW (ref 1.7–7.7)
Neutrophils Relative %: 44 %
Platelets: 181 10*3/uL (ref 150–400)
RBC: 4.19 MIL/uL — ABNORMAL LOW (ref 4.22–5.81)
RDW: 13.9 % (ref 11.5–15.5)
WBC: 3.2 10*3/uL — ABNORMAL LOW (ref 4.0–10.5)
nRBC: 0 % (ref 0.0–0.2)

## 2022-04-18 LAB — CMP (CANCER CENTER ONLY)
ALT: 34 U/L (ref 0–44)
AST: 35 U/L (ref 15–41)
Albumin: 3.8 g/dL (ref 3.5–5.0)
Alkaline Phosphatase: 38 U/L (ref 38–126)
Anion gap: 3 — ABNORMAL LOW (ref 5–15)
BUN: 16 mg/dL (ref 8–23)
CO2: 28 mmol/L (ref 22–32)
Calcium: 10.2 mg/dL (ref 8.9–10.3)
Chloride: 105 mmol/L (ref 98–111)
Creatinine: 1.14 mg/dL (ref 0.61–1.24)
GFR, Estimated: >60 mL/min (ref >60)
Glucose, Bld: 104 mg/dL — ABNORMAL HIGH (ref 70–99)
Potassium: 4.5 mmol/L (ref 3.5–5.1)
Sodium: 136 mmol/L (ref 135–145)
Total Bilirubin: 0.9 mg/dL (ref 0.3–1.2)
Total Protein: 6.1 g/dL — ABNORMAL LOW (ref 6.5–8.1)

## 2022-04-18 MED ORDER — DEXAMETHASONE 4 MG PO TABS
20.0000 mg | ORAL_TABLET | Freq: Once | ORAL | Status: AC
  Administered 2022-04-18: 20 mg via ORAL
  Filled 2022-04-18: qty 5

## 2022-04-18 MED ORDER — ONDANSETRON HCL 8 MG PO TABS
8.0000 mg | ORAL_TABLET | Freq: Once | ORAL | Status: AC
  Administered 2022-04-18: 8 mg via ORAL
  Filled 2022-04-18: qty 1

## 2022-04-18 MED ORDER — SODIUM CHLORIDE 0.9 % IV SOLN: Freq: Once | INTRAVENOUS | Status: AC

## 2022-04-18 MED ORDER — BORTEZOMIB CHEMO SQ INJECTION 3.5 MG (2.5MG/ML)
1.5000 mg/m2 | Freq: Once | INTRAMUSCULAR | Status: AC
  Administered 2022-04-18: 3.5 mg via SUBCUTANEOUS
  Filled 2022-04-18: qty 1.4

## 2022-04-18 MED ORDER — ZOLEDRONIC ACID 4 MG/100ML IV SOLN
4.0000 mg | Freq: Once | INTRAVENOUS | Status: AC
  Administered 2022-04-18: 4 mg via INTRAVENOUS
  Filled 2022-04-18: qty 100

## 2022-04-18 NOTE — Progress Notes (Signed)
Ok to treat with ANC 1.4 & to give zometa without dental clearance per Dr. Irene Limbo

## 2022-04-18 NOTE — Patient Instructions (Signed)
[facility]COLOG[facility]   ?Discharge Instructions: ?Thank you for choosing [facility] to provide your oncology and hematology care.  ? ?If you have a lab appointment with the Cancer Center, please go directly to the Cancer Center and check in at the registration area. ?  ?Wear comfortable clothing and clothing appropriate for easy access to any Portacath or PICC line.  ? ?We strive to give you quality time with your provider. You may need to reschedule your appointment if you arrive late (15 or more minutes).  Arriving late affects you and other patients whose appointments are after yours.  Also, if you miss three or more appointments without notifying the office, you may be dismissed from the clinic at the provider?s discretion.    ?  ?For prescription refill requests, have your pharmacy contact our office and allow 72 hours for refills to be completed.   ? ?Today you received the following chemotherapy and/or immunotherapy agents: bortezomib    ?  ?To help prevent nausea and vomiting after your treatment, we encourage you to take your nausea medication as directed. ? ?BELOW ARE SYMPTOMS THAT SHOULD BE REPORTED IMMEDIATELY: ?*FEVER GREATER THAN 100.4 F (38 ?C) OR HIGHER ?*CHILLS OR SWEATING ?*NAUSEA AND VOMITING THAT IS NOT CONTROLLED WITH YOUR NAUSEA MEDICATION ?*UNUSUAL SHORTNESS OF BREATH ?*UNUSUAL BRUISING OR BLEEDING ?*URINARY PROBLEMS (pain or burning when urinating, or frequent urination) ?*BOWEL PROBLEMS (unusual diarrhea, constipation, pain near the anus) ?TENDERNESS IN MOUTH AND THROAT WITH OR WITHOUT PRESENCE OF ULCERS (sore throat, sores in mouth, or a toothache) ?UNUSUAL RASH, SWELLING OR PAIN  ?UNUSUAL VAGINAL DISCHARGE OR ITCHING  ? ?Items with * indicate a potential emergency and should be followed up as soon as possible or go to the Emergency Department if any problems should occur. ? ?Please show the CHEMOTHERAPY ALERT CARD or IMMUNOTHERAPY ALERT CARD at check-in  to the Emergency Department and triage nurse. ? ?Should you have questions after your visit or need to cancel or reschedule your appointment, please contact [facility]COL[facility]  Dept: [phone]  and follow the prompts.  Office hours are 8:00 a.m. to 4:30 p.m. Monday - Friday. Please note that voicemails left after 4:00 p.m. may not be returned until the following business day.  We are closed weekends and major holidays. You have access to a nurse at all times for urgent questions. Please call the main number to the clinic Dept: [phone] and follow the prompts. ? ? ?For any non-urgent questions, you may also contact your provider using MyChart. We now offer e-Visits for anyone 18 and older to request care online for non-urgent symptoms. For details visit [email or web address]. ?  ?Also download the MyChart app! Go to the app store, search "MyChart", open the app, select [facility], and log in with your MyChart username and password. ? ?Due to Covid, a mask is required upon entering the hospital/clinic. If you do not have a mask, one will be given to you upon arrival. For doctor visits, patients may have 1 support person aged 18 or older with them. For treatment visits, patients cannot have anyone with them due to current Covid guidelines and our immunocompromised population.  ? ?

## 2022-04-25 ENCOUNTER — Inpatient Hospital Stay: Payer: Medicare Other | Attending: Hematology

## 2022-04-25 ENCOUNTER — Other Ambulatory Visit: Payer: Self-pay

## 2022-04-25 ENCOUNTER — Inpatient Hospital Stay: Payer: Medicare Other

## 2022-04-25 ENCOUNTER — Other Ambulatory Visit: Payer: Self-pay | Admitting: Hematology

## 2022-04-25 VITALS — BP 138/66 | HR 72 | Temp 98.3°F | Resp 18 | Ht 73.0 in | Wt 229.8 lb

## 2022-04-25 DIAGNOSIS — C9000 Multiple myeloma not having achieved remission: Secondary | ICD-10-CM | POA: Diagnosis present

## 2022-04-25 DIAGNOSIS — Z5112 Encounter for antineoplastic immunotherapy: Secondary | ICD-10-CM | POA: Insufficient documentation

## 2022-04-25 DIAGNOSIS — Z7189 Other specified counseling: Secondary | ICD-10-CM

## 2022-04-25 LAB — CBC WITH DIFFERENTIAL/PLATELET
Abs Immature Granulocytes: 0.01 10*3/uL (ref 0.00–0.07)
Basophils Absolute: 0 10*3/uL (ref 0.0–0.1)
Basophils Relative: 1 %
Eosinophils Absolute: 0.3 10*3/uL (ref 0.0–0.5)
Eosinophils Relative: 7 %
HCT: 34.8 % — ABNORMAL LOW (ref 39.0–52.0)
Hemoglobin: 11.4 g/dL — ABNORMAL LOW (ref 13.0–17.0)
Immature Granulocytes: 0 %
Lymphocytes Relative: 16 %
Lymphs Abs: 0.7 10*3/uL (ref 0.7–4.0)
MCH: 28.8 pg (ref 26.0–34.0)
MCHC: 32.8 g/dL (ref 30.0–36.0)
MCV: 87.9 fL (ref 80.0–100.0)
Monocytes Absolute: 0.6 10*3/uL (ref 0.1–1.0)
Monocytes Relative: 15 %
Neutro Abs: 2.5 10*3/uL (ref 1.7–7.7)
Neutrophils Relative %: 61 %
Platelets: 177 10*3/uL (ref 150–400)
RBC: 3.96 MIL/uL — ABNORMAL LOW (ref 4.22–5.81)
RDW: 13.7 % (ref 11.5–15.5)
WBC: 4.2 10*3/uL (ref 4.0–10.5)
nRBC: 0 % (ref 0.0–0.2)

## 2022-04-25 LAB — CMP (CANCER CENTER ONLY)
ALT: 31 U/L (ref 0–44)
AST: 30 U/L (ref 15–41)
Albumin: 3.5 g/dL (ref 3.5–5.0)
Alkaline Phosphatase: 34 U/L — ABNORMAL LOW (ref 38–126)
Anion gap: 5 (ref 5–15)
BUN: 20 mg/dL (ref 8–23)
CO2: 26 mmol/L (ref 22–32)
Calcium: 9.7 mg/dL (ref 8.9–10.3)
Chloride: 108 mmol/L (ref 98–111)
Creatinine: 1.04 mg/dL (ref 0.61–1.24)
GFR, Estimated: >60 mL/min (ref >60)
Glucose, Bld: 98 mg/dL (ref 70–99)
Potassium: 4 mmol/L (ref 3.5–5.1)
Sodium: 139 mmol/L (ref 135–145)
Total Bilirubin: 0.5 mg/dL (ref 0.3–1.2)
Total Protein: 6.1 g/dL — ABNORMAL LOW (ref 6.5–8.1)

## 2022-04-25 MED ORDER — BORTEZOMIB CHEMO SQ INJECTION 3.5 MG (2.5MG/ML)
1.5000 mg/m2 | Freq: Once | INTRAMUSCULAR | Status: AC
  Administered 2022-04-25: 3.5 mg via SUBCUTANEOUS
  Filled 2022-04-25: qty 1.4

## 2022-04-25 MED ORDER — ONDANSETRON HCL 8 MG PO TABS
8.0000 mg | ORAL_TABLET | Freq: Once | ORAL | Status: AC
  Administered 2022-04-25: 8 mg via ORAL
  Filled 2022-04-25: qty 1

## 2022-04-25 MED ORDER — LENALIDOMIDE 25 MG PO CAPS: 25.0000 mg | ORAL_CAPSULE | Freq: Every day | ORAL | 2 refills | Status: DC

## 2022-04-25 MED ORDER — DEXAMETHASONE 4 MG PO TABS
20.0000 mg | ORAL_TABLET | Freq: Once | ORAL | Status: AC
  Administered 2022-04-25: 20 mg via ORAL
  Filled 2022-04-25: qty 5

## 2022-04-25 NOTE — Patient Instructions (Signed)
[facility]COLOG[facility]  Discharge Instructions: Thank you for choosing [facility] to provide your oncology and hematology care.   If you have a lab appointment with the Cancer Center, please go directly to the Cancer Center and check in at the registration area.   Wear comfortable clothing and clothing appropriate for easy access to any Portacath or PICC line.   We strive to give you quality time with your provider. You may need to reschedule your appointment if you arrive late (15 or more minutes).  Arriving late affects you and other patients whose appointments are after yours.  Also, if you miss three or more appointments without notifying the office, you may be dismissed from the clinic at the provider's discretion.      For prescription refill requests, have your pharmacy contact our office and allow 72 hours for refills to be completed.    Today you received the following chemotherapy and/or immunotherapy agent: Bortezomib (Velcade).   To help prevent nausea and vomiting after your treatment, we encourage you to take your nausea medication as directed.  BELOW ARE SYMPTOMS THAT SHOULD BE REPORTED IMMEDIATELY: *FEVER GREATER THAN 100.4 F (38 C) OR HIGHER *CHILLS OR SWEATING *NAUSEA AND VOMITING THAT IS NOT CONTROLLED WITH YOUR NAUSEA MEDICATION *UNUSUAL SHORTNESS OF BREATH *UNUSUAL BRUISING OR BLEEDING *URINARY PROBLEMS (pain or burning when urinating, or frequent urination) *BOWEL PROBLEMS (unusual diarrhea, constipation, pain near the anus) TENDERNESS IN MOUTH AND THROAT WITH OR WITHOUT PRESENCE OF ULCERS (sore throat, sores in mouth, or a toothache) UNUSUAL RASH, SWELLING OR PAIN  UNUSUAL VAGINAL DISCHARGE OR ITCHING   Items with * indicate a potential emergency and should be followed up as soon as possible or go to the Emergency Department if any problems should occur.  Please show the CHEMOTHERAPY ALERT CARD or IMMUNOTHERAPY ALERT CARD at  check-in to the Emergency Department and triage nurse.  Should you have questions after your visit or need to cancel or reschedule your appointment, please contact [facility] ONCOL[facility]  Dept: [phone]  and follow the prompts.  Office hours are 8:00 a.m. to 4:30 p.m. Monday - Friday. Please note that voicemails left after 4:00 p.m. may not be returned until the following business day.  We are closed weekends and major holidays. You have access to a nurse at all times for urgent questions. Please call the main number to the clinic Dept: [phone] and follow the prompts.   For any non-urgent questions, you may also contact your provider using MyChart. We now offer e-Visits for anyone 18 and older to request care online for non-urgent symptoms. For details visit [email or web address].   Also download the MyChart app! Go to the app store, search "MyChart", open the app, select , and log in with your MyChart username and password.  Due to Covid, a mask is required upon entering the hospital/clinic. If you do not have a mask, one will be given to you upon arrival. For doctor visits, patients may have 1 support person aged 18 or older with them. For treatment visits, patients cannot have anyone with them due to current Covid guidelines and our immunocompromised population.   

## 2022-04-25 NOTE — Telephone Encounter (Signed)
Dose changed to 25 mg. New Rx will be sent

## 2022-04-28 ENCOUNTER — Ambulatory Visit
Admission: RE | Admit: 2022-04-28 | Discharge: 2022-04-28 | Disposition: A | Discharge status: Home / Self Care | Payer: Medicare Other | Source: Ambulatory Visit | Attending: Urology | Admitting: Urology

## 2022-04-28 DIAGNOSIS — R972 Elevated prostate specific antigen [PSA]: Secondary | ICD-10-CM

## 2022-04-28 MED ORDER — GADOBENATE DIMEGLUMINE 529 MG/ML IV SOLN
20.0000 mL | Freq: Once | INTRAVENOUS | Status: AC | PRN
  Administered 2022-04-28: 20 mL via INTRAVENOUS

## 2022-04-29 ENCOUNTER — Other Ambulatory Visit: Payer: Self-pay

## 2022-04-29 ENCOUNTER — Telehealth: Payer: Self-pay

## 2022-04-29 DIAGNOSIS — C9000 Multiple myeloma not having achieved remission: Secondary | ICD-10-CM

## 2022-04-29 MED ORDER — DEXAMETHASONE 4 MG PO TABS: ORAL_TABLET | ORAL | 2 refills | Status: DC

## 2022-04-29 NOTE — Telephone Encounter (Signed)
Returned call to pt. Pt has been getting charged for oral meds pre tx each time he comes, Per Pt's insurance his plan will not pay for [facility] to give him medication he can give himself. Per Dr Irene Limbo pt can take his own zofran and dexamethasone. Pt has current script for zofran ,will send in dex. Pt verbalized understanding he is to bring the 2 prescriptions with him to his weekly tx and he will take here.

## 2022-05-02 ENCOUNTER — Inpatient Hospital Stay: Payer: Medicare Other

## 2022-05-02 ENCOUNTER — Other Ambulatory Visit: Payer: Self-pay

## 2022-05-02 VITALS — BP 151/69 | HR 64 | Temp 98.1°F | Resp 16

## 2022-05-02 DIAGNOSIS — Z7189 Other specified counseling: Secondary | ICD-10-CM

## 2022-05-02 DIAGNOSIS — C9000 Multiple myeloma not having achieved remission: Secondary | ICD-10-CM

## 2022-05-02 DIAGNOSIS — Z5112 Encounter for antineoplastic immunotherapy: Secondary | ICD-10-CM | POA: Diagnosis not present

## 2022-05-02 LAB — CBC WITH DIFFERENTIAL/PLATELET
Abs Immature Granulocytes: 0.01 10*3/uL (ref 0.00–0.07)
Basophils Absolute: 0 10*3/uL (ref 0.0–0.1)
Basophils Relative: 1 %
Eosinophils Absolute: 0.4 10*3/uL (ref 0.0–0.5)
Eosinophils Relative: 8 %
HCT: 37.7 % — ABNORMAL LOW (ref 39.0–52.0)
Hemoglobin: 12.4 g/dL — ABNORMAL LOW (ref 13.0–17.0)
Immature Granulocytes: 0 %
Lymphocytes Relative: 19 %
Lymphs Abs: 0.8 10*3/uL (ref 0.7–4.0)
MCH: 28.6 pg (ref 26.0–34.0)
MCHC: 32.9 g/dL (ref 30.0–36.0)
MCV: 87.1 fL (ref 80.0–100.0)
Monocytes Absolute: 0.4 10*3/uL (ref 0.1–1.0)
Monocytes Relative: 9 %
Neutro Abs: 2.8 10*3/uL (ref 1.7–7.7)
Neutrophils Relative %: 63 %
Platelets: 183 10*3/uL (ref 150–400)
RBC: 4.33 MIL/uL (ref 4.22–5.81)
RDW: 13.7 % (ref 11.5–15.5)
WBC: 4.5 10*3/uL (ref 4.0–10.5)
nRBC: 0 % (ref 0.0–0.2)

## 2022-05-02 LAB — CMP (CANCER CENTER ONLY)
ALT: 30 U/L (ref 0–44)
AST: 31 U/L (ref 15–41)
Albumin: 3.8 g/dL (ref 3.5–5.0)
Alkaline Phosphatase: 42 U/L (ref 38–126)
Anion gap: 3 — ABNORMAL LOW (ref 5–15)
BUN: 16 mg/dL (ref 8–23)
CO2: 28 mmol/L (ref 22–32)
Calcium: 9.9 mg/dL (ref 8.9–10.3)
Chloride: 107 mmol/L (ref 98–111)
Creatinine: 0.99 mg/dL (ref 0.61–1.24)
GFR, Estimated: >60 mL/min (ref >60)
Glucose, Bld: 142 mg/dL — ABNORMAL HIGH (ref 70–99)
Potassium: 4.1 mmol/L (ref 3.5–5.1)
Sodium: 138 mmol/L (ref 135–145)
Total Bilirubin: 0.7 mg/dL (ref 0.3–1.2)
Total Protein: 6.4 g/dL — ABNORMAL LOW (ref 6.5–8.1)

## 2022-05-02 MED ORDER — BORTEZOMIB CHEMO SQ INJECTION 3.5 MG (2.5MG/ML)
1.5000 mg/m2 | Freq: Once | INTRAMUSCULAR | Status: AC
  Administered 2022-05-02: 3.5 mg via SUBCUTANEOUS
  Filled 2022-05-02: qty 1.4

## 2022-05-02 NOTE — Patient Instructions (Signed)
[facility]COL[facility]   ?Discharge Instructions: ?Thank you for choosing [facility] to provide your oncology and hematology care.  ? ?If you have a lab appointment with the Cancer Center, please go directly to the Cancer Center and check in at the registration area. ?  ?Wear comfortable clothing and clothing appropriate for easy access to any Portacath or PICC line.  ? ?We strive to give you quality time with your provider. You may need to reschedule your appointment if you arrive late (15 or more minutes).  Arriving late affects you and other patients whose appointments are after yours.  Also, if you miss three or more appointments without notifying the office, you may be dismissed from the clinic at the provider?s discretion.    ?  ?For prescription refill requests, have your pharmacy contact our office and allow 72 hours for refills to be completed.   ? ?Today you received the following chemotherapy and/or immunotherapy agents: bortezomib    ?  ?To help prevent nausea and vomiting after your treatment, we encourage you to take your nausea medication as directed. ? ?BELOW ARE SYMPTOMS THAT SHOULD BE REPORTED IMMEDIATELY: ?*FEVER GREATER THAN 100.4 F (38 ?C) OR HIGHER ?*CHILLS OR SWEATING ?*NAUSEA AND VOMITING THAT IS NOT CONTROLLED WITH YOUR NAUSEA MEDICATION ?*UNUSUAL SHORTNESS OF BREATH ?*UNUSUAL BRUISING OR BLEEDING ?*URINARY PROBLEMS (pain or burning when urinating, or frequent urination) ?*BOWEL PROBLEMS (unusual diarrhea, constipation, pain near the anus) ?TENDERNESS IN MOUTH AND THROAT WITH OR WITHOUT PRESENCE OF ULCERS (sore throat, sores in mouth, or a toothache) ?UNUSUAL RASH, SWELLING OR PAIN  ?UNUSUAL VAGINAL DISCHARGE OR ITCHING  ? ?Items with * indicate a potential emergency and should be followed up as soon as possible or go to the Emergency Department if any problems should occur. ? ?Please show the CHEMOTHERAPY ALERT CARD or IMMUNOTHERAPY ALERT CARD at check-in  to the Emergency Department and triage nurse. ? ?Should you have questions after your visit or need to cancel or reschedule your appointment, please contact [facility]  Dept: [phone]  and follow the prompts.  Office hours are 8:00 a.m. to 4:30 p.m. Monday - Friday. Please note that voicemails left after 4:00 p.m. may not be returned until the following business day.  We are closed weekends and major holidays. You have access to a nurse at all times for urgent questions. Please call the main number to the clinic Dept: [phone] and follow the prompts. ? ? ?For any non-urgent questions, you may also contact your provider using MyChart. We now offer e-Visits for anyone 18 and older to request care online for non-urgent symptoms. For details visit [email or web address]. ?  ?Also download the MyChart app! Go to the app store, search "MyChart", open the app, select , and log in with your MyChart username and password. ? ?Due to Covid, a mask is required upon entering the hospital/clinic. If you do not have a mask, one will be given to you upon arrival. For doctor visits, patients may have 1 support person aged 18 or older with them. For treatment visits, patients cannot have anyone with them due to current Covid guidelines and our immunocompromised population.  ? ?

## 2022-05-02 NOTE — Progress Notes (Signed)
Patient brought his ondansetron and dexamethasone from home to take prior to his injection.

## 2022-05-06 ENCOUNTER — Other Ambulatory Visit: Payer: Self-pay

## 2022-05-06 DIAGNOSIS — C9000 Multiple myeloma not having achieved remission: Secondary | ICD-10-CM

## 2022-05-07 ENCOUNTER — Other Ambulatory Visit: Payer: Self-pay

## 2022-05-07 ENCOUNTER — Inpatient Hospital Stay (HOSPITAL_BASED_OUTPATIENT_CLINIC_OR_DEPARTMENT_OTHER): Payer: Medicare Other | Admitting: Hematology

## 2022-05-07 ENCOUNTER — Inpatient Hospital Stay: Payer: Medicare Other

## 2022-05-07 VITALS — BP 155/70 | HR 60 | Temp 98.1°F | Resp 18 | Ht 73.0 in | Wt 233.4 lb

## 2022-05-07 DIAGNOSIS — C9000 Multiple myeloma not having achieved remission: Secondary | ICD-10-CM

## 2022-05-07 DIAGNOSIS — Z5111 Encounter for antineoplastic chemotherapy: Secondary | ICD-10-CM

## 2022-05-07 DIAGNOSIS — Z5112 Encounter for antineoplastic immunotherapy: Secondary | ICD-10-CM | POA: Diagnosis not present

## 2022-05-07 DIAGNOSIS — M7989 Other specified soft tissue disorders: Secondary | ICD-10-CM

## 2022-05-07 DIAGNOSIS — Z7189 Other specified counseling: Secondary | ICD-10-CM

## 2022-05-07 LAB — CMP (CANCER CENTER ONLY)
ALT: 24 U/L (ref 0–44)
AST: 21 U/L (ref 15–41)
Albumin: 3.6 g/dL (ref 3.5–5.0)
Alkaline Phosphatase: 40 U/L (ref 38–126)
Anion gap: 3 — ABNORMAL LOW (ref 5–15)
BUN: 19 mg/dL (ref 8–23)
CO2: 30 mmol/L (ref 22–32)
Calcium: 10.4 mg/dL — ABNORMAL HIGH (ref 8.9–10.3)
Chloride: 106 mmol/L (ref 98–111)
Creatinine: 1.01 mg/dL (ref 0.61–1.24)
GFR, Estimated: >60 mL/min (ref >60)
Glucose, Bld: 91 mg/dL (ref 70–99)
Potassium: 4.4 mmol/L (ref 3.5–5.1)
Sodium: 139 mmol/L (ref 135–145)
Total Bilirubin: 0.5 mg/dL (ref 0.3–1.2)
Total Protein: 6.2 g/dL — ABNORMAL LOW (ref 6.5–8.1)

## 2022-05-07 LAB — CBC WITH DIFFERENTIAL/PLATELET
Abs Immature Granulocytes: 0.01 10*3/uL (ref 0.00–0.07)
Basophils Absolute: 0 10*3/uL (ref 0.0–0.1)
Basophils Relative: 1 %
Eosinophils Absolute: 0.2 10*3/uL (ref 0.0–0.5)
Eosinophils Relative: 5 %
HCT: 35.9 % — ABNORMAL LOW (ref 39.0–52.0)
Hemoglobin: 11.8 g/dL — ABNORMAL LOW (ref 13.0–17.0)
Immature Granulocytes: 0 %
Lymphocytes Relative: 25 %
Lymphs Abs: 1 10*3/uL (ref 0.7–4.0)
MCH: 28.2 pg (ref 26.0–34.0)
MCHC: 32.9 g/dL (ref 30.0–36.0)
MCV: 85.7 fL (ref 80.0–100.0)
Monocytes Absolute: 0.8 10*3/uL (ref 0.1–1.0)
Monocytes Relative: 19 %
Neutro Abs: 1.9 10*3/uL (ref 1.7–7.7)
Neutrophils Relative %: 50 %
Platelets: 216 10*3/uL (ref 150–400)
RBC: 4.19 MIL/uL — ABNORMAL LOW (ref 4.22–5.81)
RDW: 13.9 % (ref 11.5–15.5)
WBC: 3.9 10*3/uL — ABNORMAL LOW (ref 4.0–10.5)
nRBC: 0 % (ref 0.0–0.2)

## 2022-05-07 MED ORDER — DEXAMETHASONE 4 MG PO TABS: 20.0000 mg | ORAL_TABLET | Freq: Once | ORAL | Status: DC

## 2022-05-07 MED ORDER — BORTEZOMIB CHEMO SQ INJECTION 3.5 MG (2.5MG/ML)
1.5000 mg/m2 | Freq: Once | INTRAMUSCULAR | Status: AC
  Administered 2022-05-07: 3.5 mg via SUBCUTANEOUS
  Filled 2022-05-07: qty 1.4

## 2022-05-07 MED ORDER — ONDANSETRON HCL 8 MG PO TABS: 8.0000 mg | ORAL_TABLET | Freq: Once | ORAL | Status: DC

## 2022-05-07 NOTE — Patient Instructions (Signed)
[facility]COLOG[facility]   ?Discharge Instructions: ?Thank you for choosing [facility] to provide your oncology and hematology care.  ? ?If you have a lab appointment with the Cancer Center, please go directly to the Cancer Center and check in at the registration area. ?  ?Wear comfortable clothing and clothing appropriate for easy access to any Portacath or PICC line.  ? ?We strive to give you quality time with your provider. You may need to reschedule your appointment if you arrive late (15 or more minutes).  Arriving late affects you and other patients whose appointments are after yours.  Also, if you miss three or more appointments without notifying the office, you may be dismissed from the clinic at the provider?s discretion.    ?  ?For prescription refill requests, have your pharmacy contact our office and allow 72 hours for refills to be completed.   ? ?Today you received the following chemotherapy and/or immunotherapy agents: bortezomib    ?  ?To help prevent nausea and vomiting after your treatment, we encourage you to take your nausea medication as directed. ? ?BELOW ARE SYMPTOMS THAT SHOULD BE REPORTED IMMEDIATELY: ?*FEVER GREATER THAN 100.4 F (38 ?C) OR HIGHER ?*CHILLS OR SWEATING ?*NAUSEA AND VOMITING THAT IS NOT CONTROLLED WITH YOUR NAUSEA MEDICATION ?*UNUSUAL SHORTNESS OF BREATH ?*UNUSUAL BRUISING OR BLEEDING ?*URINARY PROBLEMS (pain or burning when urinating, or frequent urination) ?*BOWEL PROBLEMS (unusual diarrhea, constipation, pain near the anus) ?TENDERNESS IN MOUTH AND THROAT WITH OR WITHOUT PRESENCE OF ULCERS (sore throat, sores in mouth, or a toothache) ?UNUSUAL RASH, SWELLING OR PAIN  ?UNUSUAL VAGINAL DISCHARGE OR ITCHING  ? ?Items with * indicate a potential emergency and should be followed up as soon as possible or go to the Emergency Department if any problems should occur. ? ?Please show the CHEMOTHERAPY ALERT CARD or IMMUNOTHERAPY ALERT CARD at check-in  to the Emergency Department and triage nurse. ? ?Should you have questions after your visit or need to cancel or reschedule your appointment, please contact [facility]  Dept: [phone]  and follow the prompts.  Office hours are 8:00 a.m. to 4:30 p.m. Monday - Friday. Please note that voicemails left after 4:00 p.m. may not be returned until the following business day.  We are closed weekends and major holidays. You have access to a nurse at all times for urgent questions. Please call the main number to the clinic Dept: [phone] and follow the prompts. ? ? ?For any non-urgent questions, you may also contact your provider using MyChart. We now offer e-Visits for anyone 18 and older to request care online for non-urgent symptoms. For details visit [email or web address]. ?  ?Also download the MyChart app! Go to the app store, search "MyChart", open the app, select [facility], and log in with your MyChart username and password. ? ?Due to Covid, a mask is required upon entering the hospital/clinic. If you do not have a mask, one will be given to you upon arrival. For doctor visits, patients may have 1 support person aged 18 or older with them. For treatment visits, patients cannot have anyone with them due to current Covid guidelines and our immunocompromised population.  ? ?

## 2022-05-07 NOTE — Progress Notes (Signed)
Patient took home supply of dexamethasone and zofran while in infusion.

## 2022-05-07 NOTE — Progress Notes (Signed)
Nathan Martinez   HEMATOLOGY/ONCOLOGY CLINIC NOTE   Date of Service: 05/07/2022  Patient Care Team: [name], MD as PCP - General (Family Medicine) [name], MD as Consulting Physician (Hematology)  CHIEF COMPLAINTS/PURPOSE OF CONSULTATION:  Follow-up for continued evaluation and management of multiple myeloma  HISTORY OF PRESENTING ILLNESS:  Please see previous note for details of initial presentation  INTERVAL HISTORY  [name] is a 76 y.o. male here for continued evaluation and management of multiple myeloma and follow-up prior to starting fourth cycle of RVD. He reports He is doing well with no new symptoms or concerns.  He started increased Revlimid 25 mg dosage today. He has been tolerating the previous dosage of 15 mg well with no prohibitive toxicities.  He reports some mild inconsistent neuropathy in bottom of feet.  No fevers no chills no night sweats.  No significant new fatigue. No other new or acute focal symptoms.  No other notable toxicities from his current treatment.    Labs done today were reviewed in detail with the patient.  MEDICAL HISTORY:  Intermittent hypercalcemia - primary hyperparathyroidism status post parathyroidectomy in 2019 HTN HLD Glaucoma History of Helicobacter pylori infection previously treated Chronic leukopenia/neutropenia History of present iron deficiency anemia  SURGICAL HISTORY:  Rt TKA 2018 Parathyroidectomy 2019 B/l cataracts sx 2021  SOCIAL HISTORY: Social History   Socioeconomic History   Marital status: Married    Spouse name: Not on file   Number of children: Not on file   Years of education: Not on file   Highest education level: Not on file  Occupational History   Not on file  Tobacco Use   Smoking status: Never   Smokeless tobacco: Never  Vaping Use   Vaping Use: Never used  Substance and Sexual Activity   Alcohol use: Not Currently   Drug use: Never   Sexual activity: Not Currently    Birth  control/protection: None  Other Topics Concern   Not on file  Social History Narrative   Not on file   Social Determinants of Health   Financial Resource Strain: Not on file  Food Insecurity: Not on file  Transportation Needs: Not on file  Physical Activity: Not on file  Stress: Not on file  Social Connections: Not on file  Intimate Partner Violence: Not on file  Ex smoker (quit 1976) 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  MEDICATIONS:   Krill oil Olmesartan hydrochlorothiazide 20-12.5 mg Ferrex 150 mg p.o. daily Aspirin 81 mg p.o. daily Atorvastatin 20 mg p.o. daily Multivitamin 1 tablet p.o. daily Ergocalciferol 50,000 units once weekly   REVIEW OF SYSTEMS:   10 Point review of Systems was done is negative except as noted above.  PHYSICAL EXAMINATION: .BP (!) 155/70 (BP Location: Left Arm, Patient Position: Sitting)   Pulse 60   Temp 98.1 F (36.7 C) (Tympanic)   Resp 18   Ht 6' 1" (1.854 m)   Wt 233 lb 6.4 oz (105.9 kg)   SpO2 98%   BMI 30.79 kg/m  NAD GENERAL:alert, in no acute distress and comfortable SKIN: no acute rashes, no significant lesions EYES: conjunctiva are pink and non-injected, sclera anicteric NECK: supple, no JVD LYMPH:  no palpable lymphadenopathy in the cervical, axillary or inguinal regions LUNGS: clear to auscultation b/l with normal respiratory effort HEART: regular  rate & rhythm ABDOMEN:  normoactive bowel sounds , non tender, not distended. Extremity: no pedal edema PSYCH: alert & oriented x 3 with fluent speech NEURO: no focal motor/sensory deficits  LABORATORY DATA:  I have reviewed the data as listed  .    Latest Ref Rng & Units 05/02/2022   12:54 PM 04/25/2022    1:11 PM 04/18/2022   12:30 PM  CBC  WBC 4.0 - 10.5 K/uL 4.5  4.2  3.2   Hemoglobin 13.0 - 17.0 g/dL  12.4  11.4  12.1   Hematocrit 39.0 - 52.0 % 37.7  34.8  36.7   Platelets 150 - 400 K/uL 183  177  181    .CBC    Component Value Date/Time   WBC 4.5 05/02/2022 1254   RBC 4.33 05/02/2022 1254   HGB 12.4 (L) 05/02/2022 1254   HGB 11.3 (L) 03/14/2022 1307   HCT 37.7 (L) 05/02/2022 1254   PLT 183 05/02/2022 1254   PLT 95 (L) 03/14/2022 1307   MCV 87.1 05/02/2022 1254   MCH 28.6 05/02/2022 1254   MCHC 32.9 05/02/2022 1254   RDW 13.7 05/02/2022 1254   LYMPHSABS 0.8 05/02/2022 1254   MONOABS 0.4 05/02/2022 1254   EOSABS 0.4 05/02/2022 1254   BASOSABS 0.0 05/02/2022 1254    .    Latest Ref Rng & Units 05/02/2022   12:54 PM 04/25/2022    1:11 PM 04/18/2022   12:30 PM  CMP  Glucose 70 - 99 mg/dL 142  98  104   BUN 8 - 23 mg/dL _0 Creatinine 0.61 - 1.24 mg/dL 0.99  1.04  1.14   Sodium 135 - 145 mmol/L 138  139  136   Potassium 3.5 - 5.1 mmol/L 4.1  4.0  4.5   Chloride 98 - 111 mmol/L 107  108  105   CO2 22 - 32 mmol/L _1 Calcium 8.9 - 10.3 mg/dL 9.9  9.7  10.2   Total Protein 6.5 - 8.1 g/dL 6.4  6.1  6.1   Total Bilirubin 0.3 - 1.2 mg/dL 0.7  0.5  0.9   Alkaline Phos 38 - 126 U/L 42  34  38   AST 15 - 41 U/L 31  30  35   ALT 0 - 44 U/L 30  31  34         RADIOGRAPHIC STUDIES:    ASSESSMENT & PLAN:   76 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  3) intermittent hypercalcemia. - resolved - which has resolved since discontinuation of hydrochlorothiazide.  PLAN -Labs done today were  reviewed in detail with the patient as noted above MM panel -pending -Patient notes no notable toxicities from his current RVD regimen. Orders reviewed and placed. -conitnue same supportive medications. -Appropriate to proceed with next cycle 4 of RVD. -Continue Revlimid to 25 mg 3 weeks on 1 week off. -Continue monthly Zometa -Continue vitamin D at least 2000 units daily. -RTC in 2 weeks for Revlimid toxicity check  4) leg swelling due to steroids --patient  using compression socks -added low doselasix Follow-up -Please schedule next 2 cycles of Velcade (8 weekly doses of Velcade) with labs as per orders -continue Zometa q4 weeks - plz schedule next 4 weeks - MD visit in 2 week for Revlimid toxicity check (will see in infusion)  The total time spent in the appointment was 30 minutes*.  All of the patient's questions were answered with apparent satisfaction. The patient knows to call the clinic with any problems, questions or concerns.   Sullivan Lone MD MS AAHIVMS [facility] Hematology/Oncology Physician [facility]  .*Total Encounter Time as defined by the Centers for Medicare and Medicaid Services includes, in addition to the face-to-face time of a patient visit (documented in the note above) non-face-to-face time: obtaining and reviewing outside history, ordering and reviewing medications, tests or procedures, care coordination (communications with other health care professionals or caregivers) and documentation in the medical record.  I, Melene Muller, am acting as scribe for Dr. Sullivan Lone, MD.  .I have reviewed the above documentation for accuracy and completeness, and I agree with the above. [name] MD

## 2022-05-09 ENCOUNTER — Ambulatory Visit: Payer: Medicare Other | Admitting: Hematology

## 2022-05-09 ENCOUNTER — Ambulatory Visit: Payer: Medicare Other

## 2022-05-09 ENCOUNTER — Other Ambulatory Visit: Payer: Medicare Other

## 2022-05-09 ENCOUNTER — Encounter: Payer: Self-pay | Admitting: Family Medicine

## 2022-05-09 ENCOUNTER — Ambulatory Visit (INDEPENDENT_AMBULATORY_CARE_PROVIDER_SITE_OTHER): Payer: Medicare Other | Admitting: Family Medicine

## 2022-05-09 VITALS — BP 132/78 | HR 58 | Temp 97.9°F | Resp 16 | Ht 73.0 in | Wt 230.8 lb

## 2022-05-09 DIAGNOSIS — I10 Essential (primary) hypertension: Secondary | ICD-10-CM | POA: Diagnosis not present

## 2022-05-09 DIAGNOSIS — R972 Elevated prostate specific antigen [PSA]: Secondary | ICD-10-CM | POA: Diagnosis not present

## 2022-05-09 DIAGNOSIS — Z0000 Encounter for general adult medical examination without abnormal findings: Secondary | ICD-10-CM

## 2022-05-09 DIAGNOSIS — R739 Hyperglycemia, unspecified: Secondary | ICD-10-CM

## 2022-05-09 DIAGNOSIS — E785 Hyperlipidemia, unspecified: Secondary | ICD-10-CM | POA: Diagnosis not present

## 2022-05-09 MED ORDER — ATORVASTATIN CALCIUM 20 MG PO TABS: 20.0000 mg | ORAL_TABLET | Freq: Every day | ORAL | 1 refills | Status: DC

## 2022-05-09 MED ORDER — OLMESARTAN MEDOXOMIL 20 MG PO TABS: 20.0000 mg | ORAL_TABLET | Freq: Every day | ORAL | 1 refills | Status: DC

## 2022-05-09 NOTE — Patient Instructions (Addendum)
Continue follow up with urologist and hematologist as planned.  I would consider repeat covid booster. Flu vaccine when available.  150 minutes exercise per week spread over most days.  Consider Colonoscopy next year.  Recheck in 6 months with fasting labs at that time. Watch sodium in the diet as it can worsen leg swelling.  Follow-up if that worsens.  Watch sugar containing beverages like sodas and sweet tea, stay active with exercise recommendations as above.  Repeat prediabetes test at next visit. Pressure looked okay here today, but if you continue to have elevated home readings, let us know and we can schedule a nurse visit with your monitor to make sure it is reading accurately. Thanks for coming in today.  Let me know if there are questions.  Preventive Care 81 Years and Older, Male Preventive care refers to lifestyle choices and visits with your health care provider that can promote health and wellness. Preventive care visits are also called wellness exams. What can I expect for my preventive care visit? Counseling During your preventive care visit, your health care provider may ask about your: Medical history, including: Past medical problems. Family medical history. History of falls. Current health, including: Emotional well-being. Home life and relationship well-being. Sexual activity. Memory and ability to understand (cognition). Lifestyle, including: Alcohol, nicotine or tobacco, and drug use. Access to firearms. Diet, exercise, and sleep habits. Work and work Statistician. Sunscreen use. Safety issues such as seatbelt and bike helmet use. Physical exam Your health care provider will check your: Height and weight. These may be used to calculate your BMI (body mass index). BMI is a measurement that tells if you are at a healthy weight. Waist circumference. This measures the distance around your waistline. This measurement also tells if you are at a healthy weight and may  help predict your risk of certain diseases, such as type 2 diabetes and high blood pressure. Heart rate and blood pressure. Body temperature. Skin for abnormal spots. What immunizations do I need?  Vaccines are usually given at various ages, according to a schedule. Your health care provider will recommend vaccines for you based on your age, medical history, and lifestyle or other factors, such as travel or where you work. What tests do I need? Screening Your health care provider may recommend screening tests for certain conditions. This may include: Lipid and cholesterol levels. Diabetes screening. This is done by checking your blood sugar (glucose) after you have not eaten for a while (fasting). Hepatitis C test. Hepatitis B test. HIV (human immunodeficiency virus) test. STI (sexually transmitted infection) testing, if you are at risk. Lung cancer screening. Colorectal cancer screening. Prostate cancer screening. Abdominal aortic aneurysm (AAA) screening. You may need this if you are a current or former smoker. Talk with your health care provider about your test results, treatment options, and if necessary, the need for more tests. Follow these instructions at home: Eating and drinking  Eat a diet that includes fresh fruits and vegetables, whole grains, lean protein, and low-fat dairy products. Limit your intake of foods with high amounts of sugar, saturated fats, and salt. Take vitamin and mineral supplements as recommended by your health care provider. Do not drink alcohol if your health care provider tells you not to drink. If you drink alcohol: Limit how much you have to 0-2 drinks a day. Know how much alcohol is in your drink. In the [place] one drink equals one 12 oz bottle of beer (355 mL), one 5 oz glass  of wine (148 mL), or one 1 oz glass of hard liquor (44 mL). Lifestyle Brush your teeth every morning and night with fluoride toothpaste. Floss one time each day. Exercise for  at least 30 minutes 5 or more days each week. Do not use any products that contain nicotine or tobacco. These products include cigarettes, chewing tobacco, and vaping devices, such as e-cigarettes. If you need help quitting, ask your health care provider. Do not use drugs. If you are sexually active, practice safe sex. Use a condom or other form of protection to prevent STIs. Take aspirin only as told by your health care provider. Make sure that you understand how much to take and what form to take. Work with your health care provider to find out whether it is safe and beneficial for you to take aspirin daily. Ask your health care provider if you need to take a cholesterol-lowering medicine (statin). Find healthy ways to manage stress, such as: Meditation, yoga, or listening to music. Journaling. Talking to a trusted person. Spending time with friends and family. Safety Always wear your seat belt while driving or riding in a vehicle. Do not drive: If you have been drinking alcohol. Do not ride with someone who has been drinking. When you are tired or distracted. While texting. If you have been using any mind-altering substances or drugs. Wear a helmet and other protective equipment during sports activities. If you have firearms in your house, make sure you follow all gun safety procedures. Minimize exposure to UV radiation to reduce your risk of skin cancer. What's next? Visit your health care provider once a year for an annual wellness visit. Ask your health care provider how often you should have your eyes and teeth checked. Stay up to date on all vaccines. This information is not intended to replace advice given to you by your health care provider. Make sure you discuss any questions you have with your health care provider. Document Revised: 05/08/2021 Document Reviewed: 05/08/2021 Elsevier Patient Education  [place].

## 2022-05-09 NOTE — Progress Notes (Signed)
Subjective:  Patient ID: Nathan Martinez, male    DOB: Mar 31, 1946  Age: 76 y.o. MRN: [id number]  CC:  Chief Complaint  Patient presents with   Annual Exam    Pt here for annual exam, pt is fasting for labs     HPI Nathan Martinez presents for Annual Exam, establish care visit in March with me, reviewed chronic meds at that time.  Multiple Myeloma: Followed by Dr. Irene Limbo, on revlimid, receiving Velcade infusions.  CBC, CMP noted from June 14.  Calcium borderline elevated, total protein 6.2, WBC 3.9, hemoglobin 11.8.  Normal platelets.  Prediabetes: Mild hyperglycemia previously.  A1c 6.1.  Diet/exercise approach with recheck in the next 6 months Lab Results  Component Value Date   HGBA1C 6.1 02/06/2022   Wt Readings from Last 3 Encounters:  05/09/22 230 lb 12.8 oz (104.7 kg)  05/07/22 233 lb 6.4 oz (105.9 kg)  04/25/22 229 lb 12.8 oz (104.2 kg)    Hypertension: Benicar 20 mg daily, no new side effects.  Home readings: 140-150/80 BP Readings from Last 3 Encounters:  05/09/22 132/78  05/07/22 (!) 155/70  05/02/22 (!) 151/69   Lab Results  Component Value Date   CREATININE 1.01 05/07/2022  Constitutional: Negative for fatigue and unexpected weight change.  Eyes: Negative for visual disturbance.  Respiratory: Negative for cough, chest tightness and shortness of breath.   Cardiovascular: Negative for chest pain, palpitations and leg swelling.  Gastrointestinal: Negative for abdominal pain and blood in stool.  Neurological: Negative for dizziness, light-headedness and headaches - rare.   Hyperlipidemia: Lipitor 20 mg daily, no new side effects.  Lab Results  Component Value Date   CHOL 165 02/06/2022   HDL 50.30 02/06/2022   LDLCALC 100 (H) 02/06/2022   TRIG 76.0 02/06/2022   CHOLHDL 3 02/06/2022   Lab Results  Component Value Date   ALT 24 05/07/2022   AST 21 05/07/2022   ALKPHOS 40 05/07/2022   BILITOT 0.5 05/07/2022       05/09/2022    8:01 AM  Depression  screen PHQ 2/9  Decreased Interest 0  Down, Depressed, Hopeless 0  PHQ - 2 Score 0    Health Maintenance  Topic Date Due   Zoster Vaccines- Shingrix (2 of 2) 05/09/2022 (Originally 10/12/2020)   COVID-19 Vaccine (3 - Pfizer risk series) 05/25/2022 (Originally 02/08/2020)   Hepatitis C Screening  02/07/2023 (Originally 12/25/1963)   INFLUENZA VACCINE  06/24/2022   TETANUS/TDAP  07/20/2024   Pneumonia Vaccine 25+ Years old  Completed   HPV VACCINES  Aged Out  Colonoscopy - reports normal in 2014. Considering repeat next year.  Prostate: elevated in March - Urologist Dr. Lovena Neighbours, appointment noted from May 11.  Prostate MRI ordered, then fusion biopsy after completing cycle of chemotherapy in the next few months. Had MRI - told was ok - plan to continue monitoring.  Lab Results  Component Value Date   PSA 4.39 (H) 02/06/2022     Immunization History  Administered Date(s) Administered   Influenza-Unspecified 08/13/2021   PFIZER(Purple Top)SARS-COV-2 Vaccination 12/21/2019, 01/11/2020   Pneumococcal Conjugate-13 08/14/2015   Pneumococcal Polysaccharide-23 10/21/2013, 02/04/2016   Tdap 07/20/2014   Zoster Recombinat (Shingrix) 08/17/2020  Shingrix - at pharmacy 08/17/20.  Covid booster - had bivalent last year.   No results found. Wears glasses. Optho yearly - recent visit. Glaucoma.   Dental:Yes and Within Last 6 months, appt last week.   Alcohol: none  Tobacco: none  Exercise: some golf, home activities,  yardwork. Stretching at home.    History Patient Active Problem List   Diagnosis Date Noted   Multiple myeloma not having achieved remission ([place]) 04/16/2022   Multiple myeloma ([place]) 01/24/2022   Counseling regarding advance care planning and goals of care 01/24/2022   Past Medical History:  Diagnosis Date   Hypertension    Past Surgical History:  Procedure Laterality Date   KNEE SURGERY Right 2018   PARATHYROID EXPLORATION     No Known Allergies Prior to  Admission medications   Medication Sig Start Date End Date Taking? Authorizing Provider  acyclovir (ZOVIRAX) 400 MG tablet Take 1 tablet (400 mg total) by mouth 2 (two) times daily. 02/14/22  Yes [name], MD  aspirin 81 MG chewable tablet Chew by mouth. 09/30/16  Yes [provider]  atorvastatin (LIPITOR) 20 MG tablet Take 1 tablet (20 mg total) by mouth at bedtime. 02/06/22  Yes [name], MD  dexamethasone (DECADRON) 4 MG tablet Take 5 tablets (20 mg total) weekly prior to treatment 04/29/22  Yes [name], MD  ergocalciferol (VITAMIN D2) 1.25 MG (50000 UT) capsule Take 1 capsule by mouth daily. 02/13/13  Yes [provider]  iron polysaccharides (NIFEREX) 150 MG capsule Take 1 capsule (150 mg total) by mouth daily. 01/23/22  Yes [name], MD  Krill Oil (OMEGA-3) 500 MG CAPS  09/30/16  Yes [provider]  latanoprost (XALATAN) 0.005 % ophthalmic solution SMARTSIG:1 Drop(s) In Eye(s) Every Evening 01/28/22  Yes [provider]  lenalidomide (REVLIMID) 25 MG capsule Take 1 capsule (25 mg total) by mouth daily. Take 1 (25 mg) capsule by mouth daily on days 1 - 21 then take 7 days off. Repeat. 04/25/22  Yes [name], MD  LORazepam (ATIVAN) 0.5 MG tablet Take 1 tablet (0.5 mg total) by mouth every 6 (six) hours as needed (Nausea or vomiting). 02/14/22  Yes [name], MD  Magnesium Oxide 400 MG CAPS    Yes [provider]  Multiple Vitamins-Minerals (MULTIVITAMIN MEN 50+) TABS Take by mouth.   Yes [provider]  ondansetron (ZOFRAN) 8 MG tablet Take 1 tablet (8 mg total) by mouth 2 (two) times daily as needed (Nausea or vomiting). 02/14/22  Yes [name], MD  prochlorperazine (COMPAZINE) 10 MG tablet Take 1 tablet (10 mg total) by mouth every 6 (six) hours as needed (Nausea or vomiting). 02/14/22  Yes [name], MD  tadalafil (CIALIS) 5 MG tablet  02/01/15  Yes [provider]  olmesartan (BENICAR) 20 MG tablet Take 1 tablet (20 mg total) by mouth daily. 02/06/22   [name], MD   Social History   Socioeconomic History   Marital status: Married    Spouse name: Not on file   Number of children: Not on file   Years of education: Not on file   Highest education level: Not on file  Occupational History   Not on file  Tobacco Use   Smoking status: Never   Smokeless tobacco: Never  Vaping Use   Vaping Use: Never used  Substance and Sexual Activity   Alcohol use: Not Currently   Drug use: Never   Sexual activity: Not Currently    Birth control/protection: None  Other Topics Concern   Not on file  Social History Narrative   Not on file   Social Determinants of Health   Financial Resource Strain: Not on file  Food Insecurity: Not on file  Transportation Needs: Not on file  Physical Activity: Not on file  Stress: Not on file  Social Connections: Not on file  Intimate Partner Violence: Not on file    Review of Systems 13 point review of systems per patient health survey noted.  Negative other than as indicated above or in HPI.    Objective:   Vitals:   05/09/22 0757  BP: 132/78  Pulse: (!) 58  Resp: 16  Temp: 97.9 F (36.6 C)  TempSrc: Temporal  SpO2: 97%  Weight: 230 lb 12.8 oz (104.7 kg)  Height: 6' 1"  (1.854 m)     Physical Exam Vitals reviewed.  Constitutional:      Appearance: He is well-developed.  HENT:     Head: Normocephalic and atraumatic.     Right Ear: External ear normal.     Left Ear: External ear normal.  Eyes:     Conjunctiva/sclera: Conjunctivae normal.     Pupils: Pupils are equal, round, and reactive to light.  Neck:     Thyroid: No thyromegaly.  Cardiovascular:     Rate and Rhythm: Normal rate and regular rhythm.     Heart sounds: Normal heart sounds.  Pulmonary:     Effort: Pulmonary effort is normal. No respiratory distress.     Breath sounds: Normal breath sounds. No wheezing.   Abdominal:     General: There is no distension.     Palpations: Abdomen is soft.     Tenderness: There is no abdominal tenderness.  Musculoskeletal:        General: No tenderness. Normal range of motion.     Cervical back: Normal range of motion and neck supple.     Comments: Trace nonpitting edema at ankles.  Lymphadenopathy:     Cervical: No cervical adenopathy.  Skin:    General: Skin is warm and dry.  Neurological:     Mental Status: He is alert and oriented to person, place, and time.     Deep Tendon Reflexes: Reflexes are normal and symmetric.  Psychiatric:        Behavior: Behavior normal.        Assessment & Plan:  Nathan Martinez is a 76 y.o. male . Annual physical exam  - -anticipatory guidance as below in AVS, screening labs above. Health maintenance items as above in HPI discussed/recommended as applicable.   Essential hypertension - Plan: olmesartan (BENICAR) 20 MG tablet  -Borderline high readings, continue same regimen for now with option of nurse visit if persistent elevated readings.  Watch sodium intake.  RTC precautions if increased pedal edema.  Hyperlipidemia, unspecified hyperlipidemia type - Plan: atorvastatin (LIPITOR) 20 MG tablet  -Tolerating Lipitor, labs few months ago.  Repeat 6 months.  Continue same dose for now  Elevated PSA  -Followed by urology as above, reports MRI was reassuring.  Continue urology follow-up.  Hyperglycemia  -Prediabetes based on last A1c, diet discussed, exercise discussed, recheck in 6 months with labs.  Meds ordered this encounter  Medications   olmesartan (BENICAR) 20 MG tablet    Sig: Take 1 tablet (20 mg total) by mouth daily.    Dispense:  90 tablet    Refill:  1   atorvastatin (LIPITOR) 20 MG tablet    Sig: Take 1 tablet (20 mg total) by mouth at bedtime.    Dispense:  90 tablet    Refill:  1   Patient Instructions  Continue follow up with urologist and hematologist as planned.  I would consider  repeat  covid booster. Flu vaccine when available.  150 minutes exercise per week spread over most days.  Consider Colonoscopy next year.  Recheck in 6 months with fasting labs at that time. Watch sodium in the diet as it can worsen leg swelling.  Follow-up if that worsens.  Watch sugar containing beverages like sodas and sweet tea, stay active with exercise recommendations as above.  Repeat prediabetes test at next visit. Pressure looked okay here today, but if you continue to have elevated home readings, let us know and we can schedule a nurse visit with your monitor to make sure it is reading accurately. Thanks for coming in today.  Let me know if there are questions.  Preventive Care 31 Years and Older, Male Preventive care refers to lifestyle choices and visits with your health care provider that can promote health and wellness. Preventive care visits are also called wellness exams. What can I expect for my preventive care visit? Counseling During your preventive care visit, your health care provider may ask about your: Medical history, including: Past medical problems. Family medical history. History of falls. Current health, including: Emotional well-being. Home life and relationship well-being. Sexual activity. Memory and ability to understand (cognition). Lifestyle, including: Alcohol, nicotine or tobacco, and drug use. Access to firearms. Diet, exercise, and sleep habits. Work and work Statistician. Sunscreen use. Safety issues such as seatbelt and bike helmet use. Physical exam Your health care provider will check your: Height and weight. These may be used to calculate your BMI (body mass index). BMI is a measurement that tells if you are at a healthy weight. Waist circumference. This measures the distance around your waistline. This measurement also tells if you are at a healthy weight and may help predict your risk of certain diseases, such as type 2 diabetes and high blood  pressure. Heart rate and blood pressure. Body temperature. Skin for abnormal spots. What immunizations do I need?  Vaccines are usually given at various ages, according to a schedule. Your health care provider will recommend vaccines for you based on your age, medical history, and lifestyle or other factors, such as travel or where you work. What tests do I need? Screening Your health care provider may recommend screening tests for certain conditions. This may include: Lipid and cholesterol levels. Diabetes screening. This is done by checking your blood sugar (glucose) after you have not eaten for a while (fasting). Hepatitis C test. Hepatitis B test. HIV (human immunodeficiency virus) test. STI (sexually transmitted infection) testing, if you are at risk. Lung cancer screening. Colorectal cancer screening. Prostate cancer screening. Abdominal aortic aneurysm (AAA) screening. You may need this if you are a current or former smoker. Talk with your health care provider about your test results, treatment options, and if necessary, the need for more tests. Follow these instructions at home: Eating and drinking  Eat a diet that includes fresh fruits and vegetables, whole grains, lean protein, and low-fat dairy products. Limit your intake of foods with high amounts of sugar, saturated fats, and salt. Take vitamin and mineral supplements as recommended by your health care provider. Do not drink alcohol if your health care provider tells you not to drink. If you drink alcohol: Limit how much you have to 0-2 drinks a day. Know how much alcohol is in your drink. In the [place] one drink equals one 12 oz bottle of beer (355 mL), one 5 oz glass of wine (148 mL), or one 1 oz glass of hard liquor (  44 mL). Lifestyle Brush your teeth every morning and night with fluoride toothpaste. Floss one time each day. Exercise for at least 30 minutes 5 or more days each week. Do not use any products that  contain nicotine or tobacco. These products include cigarettes, chewing tobacco, and vaping devices, such as e-cigarettes. If you need help quitting, ask your health care provider. Do not use drugs. If you are sexually active, practice safe sex. Use a condom or other form of protection to prevent STIs. Take aspirin only as told by your health care provider. Make sure that you understand how much to take and what form to take. Work with your health care provider to find out whether it is safe and beneficial for you to take aspirin daily. Ask your health care provider if you need to take a cholesterol-lowering medicine (statin). Find healthy ways to manage stress, such as: Meditation, yoga, or listening to music. Journaling. Talking to a trusted person. Spending time with friends and family. Safety Always wear your seat belt while driving or riding in a vehicle. Do not drive: If you have been drinking alcohol. Do not ride with someone who has been drinking. When you are tired or distracted. While texting. If you have been using any mind-altering substances or drugs. Wear a helmet and other protective equipment during sports activities. If you have firearms in your house, make sure you follow all gun safety procedures. Minimize exposure to UV radiation to reduce your risk of skin cancer. What's next? Visit your health care provider once a year for an annual wellness visit. Ask your health care provider how often you should have your eyes and teeth checked. Stay up to date on all vaccines. This information is not intended to replace advice given to you by your health care provider. Make sure you discuss any questions you have with your health care provider. Document Revised: 05/08/2021 Document Reviewed: 05/08/2021 Elsevier Patient Education  [place],   Merri Ray, MD [place] 05/09/22 8:44  AM

## 2022-05-11 ENCOUNTER — Encounter: Payer: Self-pay | Admitting: Hematology

## 2022-05-11 MED ORDER — FUROSEMIDE 20 MG PO TABS: 20.0000 mg | ORAL_TABLET | Freq: Every day | ORAL | 1 refills | Status: DC

## 2022-05-12 LAB — MULTIPLE MYELOMA PANEL, SERUM
Albumin SerPl Elph-Mcnc: 3.3 g/dL (ref 2.9–4.4)
Albumin/Glob SerPl: 1.3 (ref 0.7–1.7)
Alpha 1: 0.3 g/dL (ref 0.0–0.4)
Alpha2 Glob SerPl Elph-Mcnc: 0.7 g/dL (ref 0.4–1.0)
B-Globulin SerPl Elph-Mcnc: 0.8 g/dL (ref 0.7–1.3)
Gamma Glob SerPl Elph-Mcnc: 0.9 g/dL (ref 0.4–1.8)
Globulin, Total: 2.6 g/dL (ref 2.2–3.9)
IgA: 46 mg/dL — ABNORMAL LOW (ref 61–437)
IgG (Immunoglobin G), Serum: 942 mg/dL (ref 603–1613)
IgM (Immunoglobulin M), Srm: 22 mg/dL (ref 15–143)
M Protein SerPl Elph-Mcnc: 0.5 g/dL — ABNORMAL HIGH
Total Protein ELP: 5.9 g/dL — ABNORMAL LOW (ref 6.0–8.5)

## 2022-05-16 ENCOUNTER — Inpatient Hospital Stay: Payer: Medicare Other

## 2022-05-16 ENCOUNTER — Other Ambulatory Visit: Payer: Self-pay

## 2022-05-16 VITALS — BP 140/80 | HR 67 | Temp 98.0°F | Resp 16 | Wt 223.0 lb

## 2022-05-16 DIAGNOSIS — Z7189 Other specified counseling: Secondary | ICD-10-CM

## 2022-05-16 DIAGNOSIS — Z5112 Encounter for antineoplastic immunotherapy: Secondary | ICD-10-CM | POA: Diagnosis not present

## 2022-05-16 DIAGNOSIS — C9000 Multiple myeloma not having achieved remission: Secondary | ICD-10-CM

## 2022-05-16 LAB — CBC WITH DIFFERENTIAL/PLATELET
Abs Immature Granulocytes: 0.03 10*3/uL (ref 0.00–0.07)
Basophils Absolute: 0 10*3/uL (ref 0.0–0.1)
Basophils Relative: 1 %
Eosinophils Absolute: 0.5 10*3/uL (ref 0.0–0.5)
Eosinophils Relative: 11 %
HCT: 38.3 % — ABNORMAL LOW (ref 39.0–52.0)
Hemoglobin: 12.8 g/dL — ABNORMAL LOW (ref 13.0–17.0)
Immature Granulocytes: 1 %
Lymphocytes Relative: 19 %
Lymphs Abs: 0.8 10*3/uL (ref 0.7–4.0)
MCH: 28.4 pg (ref 26.0–34.0)
MCHC: 33.4 g/dL (ref 30.0–36.0)
MCV: 85.1 fL (ref 80.0–100.0)
Monocytes Absolute: 0.5 10*3/uL (ref 0.1–1.0)
Monocytes Relative: 11 %
Neutro Abs: 2.5 10*3/uL (ref 1.7–7.7)
Neutrophils Relative %: 57 %
Platelets: 175 10*3/uL (ref 150–400)
RBC: 4.5 MIL/uL (ref 4.22–5.81)
RDW: 14.8 % (ref 11.5–15.5)
WBC: 4.3 10*3/uL (ref 4.0–10.5)
nRBC: 0 % (ref 0.0–0.2)

## 2022-05-16 LAB — CMP (CANCER CENTER ONLY)
ALT: 23 U/L (ref 0–44)
AST: 20 U/L (ref 15–41)
Albumin: 3.8 g/dL (ref 3.5–5.0)
Alkaline Phosphatase: 45 U/L (ref 38–126)
Anion gap: 3 — ABNORMAL LOW (ref 5–15)
BUN: 18 mg/dL (ref 8–23)
CO2: 29 mmol/L (ref 22–32)
Calcium: 9.9 mg/dL (ref 8.9–10.3)
Chloride: 103 mmol/L (ref 98–111)
Creatinine: 1.25 mg/dL — ABNORMAL HIGH (ref 0.61–1.24)
GFR, Estimated: 60 mL/min — ABNORMAL LOW (ref >60)
Glucose, Bld: 94 mg/dL (ref 70–99)
Potassium: 4.4 mmol/L (ref 3.5–5.1)
Sodium: 135 mmol/L (ref 135–145)
Total Bilirubin: 1.1 mg/dL (ref 0.3–1.2)
Total Protein: 6.3 g/dL — ABNORMAL LOW (ref 6.5–8.1)

## 2022-05-16 MED ORDER — ZOLEDRONIC ACID 4 MG/100ML IV SOLN
4.0000 mg | Freq: Once | INTRAVENOUS | Status: AC
  Administered 2022-05-16: 4 mg via INTRAVENOUS
  Filled 2022-05-16: qty 100

## 2022-05-16 MED ORDER — ONDANSETRON HCL 8 MG PO TABS: 8.0000 mg | ORAL_TABLET | Freq: Once | ORAL | Status: DC

## 2022-05-16 MED ORDER — BORTEZOMIB CHEMO SQ INJECTION 3.5 MG (2.5MG/ML)
1.5000 mg/m2 | Freq: Once | INTRAMUSCULAR | Status: AC
  Administered 2022-05-16: 3.5 mg via SUBCUTANEOUS
  Filled 2022-05-16: qty 1.4

## 2022-05-16 MED ORDER — SODIUM CHLORIDE 0.9 % IV SOLN: Freq: Once | INTRAVENOUS | Status: AC

## 2022-05-16 MED ORDER — DEXAMETHASONE 4 MG PO TABS: 20.0000 mg | ORAL_TABLET | Freq: Once | ORAL | Status: DC

## 2022-05-23 ENCOUNTER — Inpatient Hospital Stay: Payer: Medicare Other

## 2022-05-23 ENCOUNTER — Other Ambulatory Visit: Payer: Self-pay

## 2022-05-23 ENCOUNTER — Other Ambulatory Visit: Payer: Self-pay | Admitting: Hematology

## 2022-05-23 ENCOUNTER — Inpatient Hospital Stay (HOSPITAL_BASED_OUTPATIENT_CLINIC_OR_DEPARTMENT_OTHER): Payer: Medicare Other | Admitting: Hematology

## 2022-05-23 VITALS — BP 124/72 | HR 63 | Temp 97.7°F | Resp 17 | Ht 73.0 in | Wt 215.8 lb

## 2022-05-23 DIAGNOSIS — C9000 Multiple myeloma not having achieved remission: Secondary | ICD-10-CM

## 2022-05-23 DIAGNOSIS — Z5112 Encounter for antineoplastic immunotherapy: Secondary | ICD-10-CM | POA: Diagnosis not present

## 2022-05-23 DIAGNOSIS — Z5111 Encounter for antineoplastic chemotherapy: Secondary | ICD-10-CM

## 2022-05-23 DIAGNOSIS — Z7189 Other specified counseling: Secondary | ICD-10-CM

## 2022-05-23 LAB — CBC WITH DIFFERENTIAL/PLATELET
Abs Immature Granulocytes: 0.01 10*3/uL (ref 0.00–0.07)
Basophils Absolute: 0 10*3/uL (ref 0.0–0.1)
Basophils Relative: 1 %
Eosinophils Absolute: 0.3 10*3/uL (ref 0.0–0.5)
Eosinophils Relative: 6 %
HCT: 36.4 % — ABNORMAL LOW (ref 39.0–52.0)
Hemoglobin: 11.9 g/dL — ABNORMAL LOW (ref 13.0–17.0)
Immature Granulocytes: 0 %
Lymphocytes Relative: 18 %
Lymphs Abs: 0.8 10*3/uL (ref 0.7–4.0)
MCH: 27.8 pg (ref 26.0–34.0)
MCHC: 32.7 g/dL (ref 30.0–36.0)
MCV: 85 fL (ref 80.0–100.0)
Monocytes Absolute: 0.9 10*3/uL (ref 0.1–1.0)
Monocytes Relative: 20 %
Neutro Abs: 2.6 10*3/uL (ref 1.7–7.7)
Neutrophils Relative %: 55 %
Platelets: 150 10*3/uL (ref 150–400)
RBC: 4.28 MIL/uL (ref 4.22–5.81)
RDW: 14.7 % (ref 11.5–15.5)
WBC: 4.6 10*3/uL (ref 4.0–10.5)
nRBC: 0 % (ref 0.0–0.2)

## 2022-05-23 LAB — CMP (CANCER CENTER ONLY)
ALT: 23 U/L (ref 0–44)
AST: 20 U/L (ref 15–41)
Albumin: 3.7 g/dL (ref 3.5–5.0)
Alkaline Phosphatase: 42 U/L (ref 38–126)
Anion gap: 4 — ABNORMAL LOW (ref 5–15)
BUN: 14 mg/dL (ref 8–23)
CO2: 27 mmol/L (ref 22–32)
Calcium: 9.8 mg/dL (ref 8.9–10.3)
Chloride: 106 mmol/L (ref 98–111)
Creatinine: 1.1 mg/dL (ref 0.61–1.24)
GFR, Estimated: >60 mL/min (ref >60)
Glucose, Bld: 90 mg/dL (ref 70–99)
Potassium: 4.1 mmol/L (ref 3.5–5.1)
Sodium: 137 mmol/L (ref 135–145)
Total Bilirubin: 0.8 mg/dL (ref 0.3–1.2)
Total Protein: 6.3 g/dL — ABNORMAL LOW (ref 6.5–8.1)

## 2022-05-23 MED ORDER — DEXAMETHASONE 4 MG PO TABS: 20.0000 mg | ORAL_TABLET | Freq: Once | ORAL | Status: DC

## 2022-05-23 MED ORDER — DRONABINOL 2.5 MG PO CAPS: 2.5000 mg | ORAL_CAPSULE | Freq: Two times a day (BID) | ORAL | 0 refills | Status: DC

## 2022-05-23 MED ORDER — DOXYCYCLINE HYCLATE 100 MG PO TABS
100.0000 mg | ORAL_TABLET | Freq: Two times a day (BID) | ORAL | 0 refills | Status: AC
Start: 2022-05-23 — End: 2022-05-30

## 2022-05-23 MED ORDER — OLOPATADINE HCL 0.2 % OP SOLN: 1.0000 [drp] | Freq: Every day | OPHTHALMIC | 1 refills | Status: DC

## 2022-05-23 MED ORDER — BORTEZOMIB CHEMO SQ INJECTION 3.5 MG (2.5MG/ML)
1.5000 mg/m2 | Freq: Once | INTRAMUSCULAR | Status: AC
  Administered 2022-05-23: 3.5 mg via SUBCUTANEOUS
  Filled 2022-05-23: qty 1.4

## 2022-05-23 MED ORDER — ONDANSETRON HCL 8 MG PO TABS: 8.0000 mg | ORAL_TABLET | Freq: Once | ORAL | Status: DC

## 2022-05-23 MED ORDER — ERYTHROMYCIN 5 MG/GM OP OINT: 1.0000 | TOPICAL_OINTMENT | Freq: Two times a day (BID) | OPHTHALMIC | 1 refills | Status: DC

## 2022-05-23 NOTE — Progress Notes (Signed)
Nathan Kitchen   HEMATOLOGY/ONCOLOGY CLINIC NOTE   Date of Service: 05/23/2022  Patient Care Team: [name], MD as PCP - General (Family Medicine) [name], MD as Consulting Physician (Hematology)  CHIEF COMPLAINTS/PURPOSE OF CONSULTATION:  Follow-up for continued evaluation and management of multiple myeloma  HISTORY OF PRESENTING ILLNESS:  Please see previous note for details of initial presentation  INTERVAL HISTORY Nathan Martinez is a 76 y.o. male here for continued evaluation and management of multiple myeloma and follow-up prior to starting fourth cycle of RVD. He reports He is doing well with no new symptoms or concerns.  He reports reduced appetite following increased dose Revlimid. He notes that it is primarily a change in taste. We discussed trying Marinol for a month to see if appetite improves. We also discussed trying a salt baking soda mouthwash and a Zinc supplement if needed.  He notes issues with styes. He further notes they started about 2-3 weeks ago and they present as dry and irritated. We discussed that this is likely due to the Velcade and he can try Pataday which he notes he has taken it previously and take an antibiotic. We also discussed taking a wet rage with johnson's baby soap or an equivalent gentle soap to clean the eyes daily and avoid crusting. We also discussed taking an over the counter oral antihistamine.  He is tolerating the increased dose Revlimid at 25 mg p.o daily with no prohibitive toxicities at this time.  We discussed that since leg swelling is well managed that he can reduce Lasix to 3 days a week Monday, Wednesday, and Friday and if the swelling continues to be well managed then he can stop completely and only take as needed.  He has a recent fall he attributes to a los of balance with no visible injuries.  No fevers no chills no night sweats.  No significant new fatigue. No loose stools or change in bowel habits. No other new or acute  focal symptoms.  No other notable toxicities from his current treatment.    Labs done today were reviewed in detail with the patient.  MEDICAL HISTORY:  Intermittent hypercalcemia - primary hyperparathyroidism status post parathyroidectomy in 2019 HTN HLD Glaucoma History of Helicobacter pylori infection previously treated Chronic leukopenia/neutropenia History of present iron deficiency anemia  SURGICAL HISTORY: Rt TKA 2018 Parathyroidectomy 2019 B/l cataracts sx 2021  SOCIAL HISTORY: Social History   Socioeconomic History   Marital status: Married    Spouse name: Not on file   Number of children: Not on file   Years of education: Not on file   Highest education level: Not on file  Occupational History   Not on file  Tobacco Use   Smoking status: Never   Smokeless tobacco: Never  Vaping Use   Vaping Use: Never used  Substance and Sexual Activity   Alcohol use: Not Currently   Drug use: Never   Sexual activity: Not Currently    Birth control/protection: None  Other Topics Concern   Not on file  Social History Narrative   Not on file   Social Determinants of Health   Financial Resource Strain: Not on file  Food Insecurity: Not on file  Transportation Needs: Not on file  Physical Activity: Not on file  Stress: Not on file  Social Connections: Not on file  Intimate Partner Violence: Not on file  Ex smoker (quit 1976) Ex secret service agent ETOH social use  FAMILY HISTORY: Sister - uterine/ovarian cancer [phone]  Mother-- lived into her 51's Father - died in 55's - fall/trauma  ALLERGIES:  has No Known Allergies.  MEDICATIONS:  Krill oil Olmesartan hydrochlorothiazide 20-12.5 mg Ferrex 150 mg p.o. daily Aspirin 81 mg p.o. daily Atorvastatin 20 mg p.o. daily Multivitamin 1 tablet p.o. daily Ergocalciferol 50,000 units once weekly   REVIEW OF SYSTEMS:   10 Point review of Systems was done is negative except as noted above.  PHYSICAL  EXAMINATION: .BP 124/72 (BP Location: Left Arm, Patient Position: Sitting)   Pulse 63   Temp 97.7 F (36.5 C) (Temporal)   Resp 17   Ht 6' 1"  (1.854 m)   Wt 215 lb 12.8 oz (97.9 kg)   SpO2 100%   BMI 28.47 kg/m  NAD GENERAL:alert, in no acute distress and comfortable SKIN: no acute rashes, no significant lesions EYES: conjunctiva are pink and non-injected, sclera anicteric NECK: supple, no JVD LYMPH:  no palpable lymphadenopathy in the cervical, axillary or inguinal regions LUNGS: clear to auscultation b/l with normal respiratory effort HEART: regular rate & rhythm ABDOMEN:  normoactive bowel sounds , non tender, not distended. Extremity: no pedal edema PSYCH: alert & oriented x 3 with fluent speech NEURO: no focal motor/sensory deficits  LABORATORY DATA:  I have reviewed the data as listed  .    Latest Ref Rng & Units 05/23/2022   12:24 PM 05/16/2022   12:41 PM 05/07/2022   11:54 AM  CBC  WBC 4.0 - 10.5 K/uL 4.6  4.3  3.9   Hemoglobin 13.0 - 17.0 g/dL 11.9  12.8  11.8   Hematocrit 39.0 - 52.0 % 36.4  38.3  35.9   Platelets 150 - 400 K/uL 150  175  216    .CBC    Component Value Date/Time   WBC 4.6 05/23/2022 1224   RBC 4.28 05/23/2022 1224   HGB 11.9 (L) 05/23/2022 1224   HGB 11.3 (L) 03/14/2022 1307   HCT 36.4 (L) 05/23/2022 1224   PLT 150 05/23/2022 1224   PLT 95 (L) 03/14/2022 1307   MCV 85.0 05/23/2022 1224   MCH 27.8 05/23/2022 1224   MCHC 32.7 05/23/2022 1224   RDW 14.7 05/23/2022 1224   LYMPHSABS 0.8 05/23/2022 1224   MONOABS 0.9 05/23/2022 1224   EOSABS 0.3 05/23/2022 1224   BASOSABS 0.0 05/23/2022 1224    .    Latest Ref Rng & Units 05/16/2022   12:41 PM 05/07/2022   11:54 AM 05/02/2022   12:54 PM  CMP  Glucose 70 - 99 mg/dL 94  91  142   BUN 8 - 23 mg/dL 18  19  16    Creatinine 0.61 - 1.24 mg/dL 1.25  1.01  0.99   Sodium 135 - 145 mmol/L 135  139  138   Potassium 3.5 - 5.1 mmol/L 4.4  4.4  4.1   Chloride 98 - 111 mmol/L 103  106  107   CO2  22 - 32 mmol/L 29  30  28    Calcium 8.9 - 10.3 mg/dL 9.9  10.4  9.9   Total Protein 6.5 - 8.1 g/dL 6.3  6.2  6.4   Total Bilirubin 0.3 - 1.2 mg/dL 1.1  0.5  0.7   Alkaline Phos 38 - 126 U/L 45  40  42   AST 15 - 41 U/L 20  21  31    ALT 0 - 44 U/L 23  24  30          RADIOGRAPHIC STUDIES:    ASSESSMENT &  PLAN:  76 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  PLAN -Labs done today were reviewed in detail with the patient as noted above CBC stable wih Hgb of 11.9, WBC count of 4.6k, and platelets of 150k. CMP unremarkable. MM panel done 05/07/2022 shows improved M protein spike of 0.5 g/dL. -Patient notes no notable toxicities from his current RVD regimen. Orders reviewed and placed. -conitnue same supportive medications. -Appropriate to proceed with next cycle 5 of RVD. -Continue Revlimid to 25 mg 3 weeks on 1 week off. -Continue monthly Zometa -Continue vitamin D at least 2000 units daily. -We discussed trying Marinol for a month to see if appetite improves. -Advised to use Pataday eye drops 1x per day. -doxycycline and op erythromycin for stye -Recommended to take a wetwipe with johnson's baby soap or an equivalent gentle soap to clean the eyes daily and avoid crusting. -Advised to take an over the counter oral antihistamine -We also discussed trying a salt baking soda mouthwash and a Zinc supplement if needed to address taste change.  4) leg swelling due to steroids -patient using compression socks -Continue  low dose Lasix. He can reduce taking Lasix to 3 days and if leg swelling continues to be well managed then he can stop it completely and only take as needed.  Follow-up Please schedule C5 and C6 of VRD chemotherapy as per orders Labs with each treatment MD visit with [id number]  The total time spent in the appointment was 30 minutes*.  All of the patient's questions were answered with apparent satisfaction. The patient knows to call the clinic with any problems, questions or concerns.   Nathan Lone MD MS AAHIVMS [facility] Hematology/Oncology Physician [facility]  .*Total Encounter Time as defined by the Centers for Medicare and Medicaid Services includes, in addition to the face-to-face time of a patient visit (documented in the note above) non-face-to-face time: obtaining and reviewing outside history, ordering and reviewing medications, tests or procedures, care coordination (communications with other health care professionals or caregivers) and documentation in the medical record.  I, Melene Muller, am acting as scribe for Dr. Sullivan Lone, MD.

## 2022-05-23 NOTE — Patient Instructions (Signed)
[facility]   ?Discharge Instructions: ?Thank you for choosing [facility] to provide your oncology and hematology care.  ? ?If you have a lab appointment with the Cancer Center, please go directly to the Cancer Center and check in at the registration area. ?  ?Wear comfortable clothing and clothing appropriate for easy access to any Portacath or PICC line.  ? ?We strive to give you quality time with your provider. You may need to reschedule your appointment if you arrive late (15 or more minutes).  Arriving late affects you and other patients whose appointments are after yours.  Also, if you miss three or more appointments without notifying the office, you may be dismissed from the clinic at the provider?s discretion.    ?  ?For prescription refill requests, have your pharmacy contact our office and allow 72 hours for refills to be completed.   ? ?Today you received the following chemotherapy and/or immunotherapy agents: bortezomib    ?  ?To help prevent nausea and vomiting after your treatment, we encourage you to take your nausea medication as directed. ? ?BELOW ARE SYMPTOMS THAT SHOULD BE REPORTED IMMEDIATELY: ?*FEVER GREATER THAN 100.4 F (38 ?C) OR HIGHER ?*CHILLS OR SWEATING ?*NAUSEA AND VOMITING THAT IS NOT CONTROLLED WITH YOUR NAUSEA MEDICATION ?*UNUSUAL SHORTNESS OF BREATH ?*UNUSUAL BRUISING OR BLEEDING ?*URINARY PROBLEMS (pain or burning when urinating, or frequent urination) ?*BOWEL PROBLEMS (unusual diarrhea, constipation, pain near the anus) ?TENDERNESS IN MOUTH AND THROAT WITH OR WITHOUT PRESENCE OF ULCERS (sore throat, sores in mouth, or a toothache) ?UNUSUAL RASH, SWELLING OR PAIN  ?UNUSUAL VAGINAL DISCHARGE OR ITCHING  ? ?Items with * indicate a potential emergency and should be followed up as soon as possible or go to the Emergency Department if any problems should occur. ? ?Please show the CHEMOTHERAPY ALERT CARD or IMMUNOTHERAPY ALERT CARD at check-in  to the Emergency Department and triage nurse. ? ?Should you have questions after your visit or need to cancel or reschedule your appointment, please contact [facility]  Dept: [phone]  and follow the prompts.  Office hours are 8:00 a.m. to 4:30 p.m. Monday - Friday. Please note that voicemails left after 4:00 p.m. may not be returned until the following business day.  We are closed weekends and major holidays. You have access to a nurse at all times for urgent questions. Please call the main number to the clinic Dept: [phone] and follow the prompts. ? ? ?For any non-urgent questions, you may also contact your provider using MyChart. We now offer e-Visits for anyone 18 and older to request care online for non-urgent symptoms. For details visit [email or web address]. ?  ?Also download the MyChart app! Go to the app store, search "MyChart", open the app, select [facility], and log in with your MyChart username and password. ? ?Due to Covid, a mask is required upon entering the hospital/clinic. If you do not have a mask, one will be given to you upon arrival. For doctor visits, patients may have 1 support Nathan Martinez aged 18 or older with them. For treatment visits, patients cannot have anyone with them due to current Covid guidelines and our immunocompromised population.  ? ?

## 2022-05-23 NOTE — Progress Notes (Signed)
Pt took his zofran and decadron from home.

## 2022-05-29 ENCOUNTER — Encounter: Payer: Self-pay | Admitting: Hematology

## 2022-05-30 ENCOUNTER — Other Ambulatory Visit: Payer: Self-pay

## 2022-05-30 ENCOUNTER — Inpatient Hospital Stay: Payer: Medicare Other | Attending: Hematology

## 2022-05-30 ENCOUNTER — Inpatient Hospital Stay: Payer: Medicare Other

## 2022-05-30 VITALS — BP 143/73 | HR 64 | Temp 97.8°F | Resp 17 | Wt 218.0 lb

## 2022-05-30 DIAGNOSIS — Z5112 Encounter for antineoplastic immunotherapy: Secondary | ICD-10-CM | POA: Diagnosis present

## 2022-05-30 DIAGNOSIS — C9000 Multiple myeloma not having achieved remission: Secondary | ICD-10-CM | POA: Insufficient documentation

## 2022-05-30 DIAGNOSIS — Z7189 Other specified counseling: Secondary | ICD-10-CM

## 2022-05-30 LAB — CBC WITH DIFFERENTIAL/PLATELET
Abs Immature Granulocytes: 0.02 10*3/uL (ref 0.00–0.07)
Basophils Absolute: 0 10*3/uL (ref 0.0–0.1)
Basophils Relative: 1 %
Eosinophils Absolute: 0.4 10*3/uL (ref 0.0–0.5)
Eosinophils Relative: 11 %
HCT: 37.5 % — ABNORMAL LOW (ref 39.0–52.0)
Hemoglobin: 12.2 g/dL — ABNORMAL LOW (ref 13.0–17.0)
Immature Granulocytes: 1 %
Lymphocytes Relative: 16 %
Lymphs Abs: 0.6 10*3/uL — ABNORMAL LOW (ref 0.7–4.0)
MCH: 27.9 pg (ref 26.0–34.0)
MCHC: 32.5 g/dL (ref 30.0–36.0)
MCV: 85.8 fL (ref 80.0–100.0)
Monocytes Absolute: 0.6 10*3/uL (ref 0.1–1.0)
Monocytes Relative: 16 %
Neutro Abs: 2 10*3/uL (ref 1.7–7.7)
Neutrophils Relative %: 55 %
Platelets: 153 10*3/uL (ref 150–400)
RBC: 4.37 MIL/uL (ref 4.22–5.81)
RDW: 15.2 % (ref 11.5–15.5)
WBC: 3.6 10*3/uL — ABNORMAL LOW (ref 4.0–10.5)
nRBC: 0 % (ref 0.0–0.2)

## 2022-05-30 LAB — CMP (CANCER CENTER ONLY)
ALT: 23 U/L (ref 0–44)
AST: 19 U/L (ref 15–41)
Albumin: 3.7 g/dL (ref 3.5–5.0)
Alkaline Phosphatase: 43 U/L (ref 38–126)
Anion gap: 1 — ABNORMAL LOW (ref 5–15)
BUN: 13 mg/dL (ref 8–23)
CO2: 30 mmol/L (ref 22–32)
Calcium: 9.7 mg/dL (ref 8.9–10.3)
Chloride: 109 mmol/L (ref 98–111)
Creatinine: 1.06 mg/dL (ref 0.61–1.24)
GFR, Estimated: >60 mL/min (ref >60)
Glucose, Bld: 106 mg/dL — ABNORMAL HIGH (ref 70–99)
Potassium: 4 mmol/L (ref 3.5–5.1)
Sodium: 140 mmol/L (ref 135–145)
Total Bilirubin: 0.6 mg/dL (ref 0.3–1.2)
Total Protein: 6.2 g/dL — ABNORMAL LOW (ref 6.5–8.1)

## 2022-05-30 MED ORDER — BORTEZOMIB CHEMO SQ INJECTION 3.5 MG (2.5MG/ML)
1.5000 mg/m2 | Freq: Once | INTRAMUSCULAR | Status: AC
  Administered 2022-05-30: 3.5 mg via SUBCUTANEOUS
  Filled 2022-05-30: qty 1.4

## 2022-05-30 MED ORDER — ONDANSETRON HCL 8 MG PO TABS: 8.0000 mg | ORAL_TABLET | Freq: Once | ORAL | Status: DC

## 2022-05-30 MED ORDER — DEXAMETHASONE 4 MG PO TABS: 20.0000 mg | ORAL_TABLET | Freq: Once | ORAL | Status: DC

## 2022-05-30 NOTE — Progress Notes (Signed)
Patient has multiple styes to both eyes.  Very red and swollen.  He stated he was seen by optometrist and was prescribed a cream and antibiotics.  Beth, RN and Dr Irene Limbo made aware

## 2022-05-30 NOTE — Patient Instructions (Signed)
[facility] ONCOLOGY  Discharge Instructions: Thank you for choosing [facility] to provide your oncology and hematology care.   If you have a lab appointment with the [facility], please go directly to the [facility] and check in at the registration area.   Wear comfortable clothing and clothing appropriate for easy access to any Portacath or PICC line.   We strive to give you quality time with your provider. You may need to reschedule your appointment if you arrive late (15 or more minutes).  Arriving late affects you and other patients whose appointments are after yours.  Also, if you miss three or more appointments without notifying the office, you may be dismissed from the clinic at the provider's discretion.      For prescription refill requests, have your pharmacy contact our office and allow 72 hours for refills to be completed.    Today you received the following chemotherapy and/or immunotherapy agent:Velcade      To help prevent nausea and vomiting after your treatment, we encourage you to take your nausea medication as directed.  BELOW ARE SYMPTOMS THAT SHOULD BE REPORTED IMMEDIATELY: *FEVER GREATER THAN 100.4 F (38 C) OR HIGHER *CHILLS OR SWEATING *NAUSEA AND VOMITING THAT IS NOT CONTROLLED WITH YOUR NAUSEA MEDICATION *UNUSUAL SHORTNESS OF BREATH *UNUSUAL BRUISING OR BLEEDING *URINARY PROBLEMS (pain or burning when urinating, or frequent urination) *BOWEL PROBLEMS (unusual diarrhea, constipation, pain near the anus) TENDERNESS IN MOUTH AND THROAT WITH OR WITHOUT PRESENCE OF ULCERS (sore throat, sores in mouth, or a toothache) UNUSUAL RASH, SWELLING OR PAIN  UNUSUAL VAGINAL DISCHARGE OR ITCHING   Items with * indicate a potential emergency and should be followed up as soon as possible or go to the Emergency Department if any problems should occur.  Please show the CHEMOTHERAPY ALERT CARD or IMMUNOTHERAPY ALERT CARD at check-in to the  Emergency Department and triage nurse.  Should you have questions after your visit or need to cancel or reschedule your appointment, please contact [facility]  Dept: [phone]  and follow the prompts.  Office hours are 8:00 a.m. to 4:30 p.m. Monday - Friday. Please note that voicemails left after 4:00 p.m. may not be returned until the following business day.  We are closed weekends and major holidays. You have access to a nurse at all times for urgent questions. Please call the main number to the clinic Dept: [phone] and follow the prompts.   For any non-urgent questions, you may also contact your provider using MyChart. We now offer e-Visits for anyone 76 and older to request care online for non-urgent symptoms. For details visit [email or web address].   Also download the MyChart app! Go to the app store, search "MyChart", open the app, select [facility], and log in with your MyChart username and password.  Masks are optional in the cancer centers. If you would like for your care team to wear a mask while they are taking care of you, please let them know. For doctor visits, patients may have with them one support person who is at least 76 years old. At this time, visitors are not allowed in the infusion area.

## 2022-05-30 NOTE — Progress Notes (Signed)
Patient reports taking home Dex and Zofran right before arrival to infusion clinic

## 2022-06-06 ENCOUNTER — Inpatient Hospital Stay: Payer: Medicare Other

## 2022-06-06 ENCOUNTER — Other Ambulatory Visit: Payer: Self-pay

## 2022-06-06 VITALS — BP 159/83 | HR 55 | Temp 98.6°F | Resp 17 | Wt 221.0 lb

## 2022-06-06 DIAGNOSIS — Z7189 Other specified counseling: Secondary | ICD-10-CM

## 2022-06-06 DIAGNOSIS — C9000 Multiple myeloma not having achieved remission: Secondary | ICD-10-CM

## 2022-06-06 DIAGNOSIS — Z5112 Encounter for antineoplastic immunotherapy: Secondary | ICD-10-CM | POA: Diagnosis not present

## 2022-06-06 LAB — CBC WITH DIFFERENTIAL/PLATELET
Abs Immature Granulocytes: 0 10*3/uL (ref 0.00–0.07)
Basophils Absolute: 0.1 10*3/uL (ref 0.0–0.1)
Basophils Relative: 1 %
Eosinophils Absolute: 0.2 10*3/uL (ref 0.0–0.5)
Eosinophils Relative: 5 %
HCT: 35.8 % — ABNORMAL LOW (ref 39.0–52.0)
Hemoglobin: 11.9 g/dL — ABNORMAL LOW (ref 13.0–17.0)
Immature Granulocytes: 0 %
Lymphocytes Relative: 21 %
Lymphs Abs: 0.8 10*3/uL (ref 0.7–4.0)
MCH: 27.9 pg (ref 26.0–34.0)
MCHC: 33.2 g/dL (ref 30.0–36.0)
MCV: 84 fL (ref 80.0–100.0)
Monocytes Absolute: 0.8 10*3/uL (ref 0.1–1.0)
Monocytes Relative: 21 %
Neutro Abs: 2 10*3/uL (ref 1.7–7.7)
Neutrophils Relative %: 52 %
Platelets: 161 10*3/uL (ref 150–400)
RBC: 4.26 MIL/uL (ref 4.22–5.81)
RDW: 15.9 % — ABNORMAL HIGH (ref 11.5–15.5)
WBC: 3.9 10*3/uL — ABNORMAL LOW (ref 4.0–10.5)
nRBC: 0 % (ref 0.0–0.2)

## 2022-06-06 LAB — CMP (CANCER CENTER ONLY)
ALT: 23 U/L (ref 0–44)
AST: 23 U/L (ref 15–41)
Albumin: 3.7 g/dL (ref 3.5–5.0)
Alkaline Phosphatase: 39 U/L (ref 38–126)
Anion gap: 2 — ABNORMAL LOW (ref 5–15)
BUN: 14 mg/dL (ref 8–23)
CO2: 29 mmol/L (ref 22–32)
Calcium: 10 mg/dL (ref 8.9–10.3)
Chloride: 110 mmol/L (ref 98–111)
Creatinine: 1.02 mg/dL (ref 0.61–1.24)
GFR, Estimated: >60 mL/min (ref >60)
Glucose, Bld: 93 mg/dL (ref 70–99)
Potassium: 4.1 mmol/L (ref 3.5–5.1)
Sodium: 141 mmol/L (ref 135–145)
Total Bilirubin: 0.7 mg/dL (ref 0.3–1.2)
Total Protein: 5.9 g/dL — ABNORMAL LOW (ref 6.5–8.1)

## 2022-06-06 MED ORDER — DEXAMETHASONE 4 MG PO TABS: 20.0000 mg | ORAL_TABLET | Freq: Once | ORAL | Status: DC

## 2022-06-06 MED ORDER — BORTEZOMIB CHEMO SQ INJECTION 3.5 MG (2.5MG/ML)
1.5000 mg/m2 | Freq: Once | INTRAMUSCULAR | Status: AC
  Administered 2022-06-06: 3.5 mg via SUBCUTANEOUS
  Filled 2022-06-06: qty 1.4

## 2022-06-06 MED ORDER — ONDANSETRON HCL 8 MG PO TABS: 8.0000 mg | ORAL_TABLET | Freq: Once | ORAL | Status: DC

## 2022-06-06 NOTE — Patient Instructions (Signed)
[facility] ONCOLOGY  Discharge Instructions: Thank you for choosing [facility] to provide your oncology and hematology care.   If you have a lab appointment with the [facility], please go directly to the [facility] and check in at the registration area.   Wear comfortable clothing and clothing appropriate for easy access to any Portacath or PICC line.   We strive to give you quality time with your provider. You may need to reschedule your appointment if you arrive late (15 or more minutes).  Arriving late affects you and other patients whose appointments are after yours.  Also, if you miss three or more appointments without notifying the office, you may be dismissed from the clinic at the provider's discretion.      For prescription refill requests, have your pharmacy contact our office and allow 72 hours for refills to be completed.    Today you received the following chemotherapy and/or immunotherapy agent:Velcade      To help prevent nausea and vomiting after your treatment, we encourage you to take your nausea medication as directed.  BELOW ARE SYMPTOMS THAT SHOULD BE REPORTED IMMEDIATELY: *FEVER GREATER THAN 100.4 F (38 C) OR HIGHER *CHILLS OR SWEATING *NAUSEA AND VOMITING THAT IS NOT CONTROLLED WITH YOUR NAUSEA MEDICATION *UNUSUAL SHORTNESS OF BREATH *UNUSUAL BRUISING OR BLEEDING *URINARY PROBLEMS (pain or burning when urinating, or frequent urination) *BOWEL PROBLEMS (unusual diarrhea, constipation, pain near the anus) TENDERNESS IN MOUTH AND THROAT WITH OR WITHOUT PRESENCE OF ULCERS (sore throat, sores in mouth, or a toothache) UNUSUAL RASH, SWELLING OR PAIN  UNUSUAL VAGINAL DISCHARGE OR ITCHING   Items with * indicate a potential emergency and should be followed up as soon as possible or go to the Emergency Department if any problems should occur.  Please show the CHEMOTHERAPY ALERT CARD or IMMUNOTHERAPY ALERT CARD at check-in to the  Emergency Department and triage nurse.  Should you have questions after your visit or need to cancel or reschedule your appointment, please contact [facility]  Dept: [phone]  and follow the prompts.  Office hours are 8:00 a.m. to 4:30 p.m. Monday - Friday. Please note that voicemails left after 4:00 p.m. may not be returned until the following business day.  We are closed weekends and major holidays. You have access to a nurse at all times for urgent questions. Please call the main number to the clinic Dept: [phone] and follow the prompts.   For any non-urgent questions, you may also contact your provider using MyChart. We now offer e-Visits for anyone 46 and older to request care online for non-urgent symptoms. For details visit [email or web address].   Also download the MyChart app! Go to the app store, search "MyChart", open the app, select [facility], and log in with your MyChart username and password.  Masks are optional in the cancer centers. If you would like for your care team to wear a mask while they are taking care of you, please let them know. For doctor visits, patients may have with them one support person who is at least 76 years old. At this time, visitors are not allowed in the infusion area.

## 2022-06-12 ENCOUNTER — Other Ambulatory Visit: Payer: Self-pay

## 2022-06-12 DIAGNOSIS — C9000 Multiple myeloma not having achieved remission: Secondary | ICD-10-CM

## 2022-06-12 LAB — MULTIPLE MYELOMA PANEL, SERUM
Albumin SerPl Elph-Mcnc: 3.2 g/dL (ref 2.9–4.4)
Albumin/Glob SerPl: 1.4 (ref 0.7–1.7)
Alpha 1: 0.2 g/dL (ref 0.0–0.4)
Alpha2 Glob SerPl Elph-Mcnc: 0.6 g/dL (ref 0.4–1.0)
B-Globulin SerPl Elph-Mcnc: 0.7 g/dL (ref 0.7–1.3)
Gamma Glob SerPl Elph-Mcnc: 0.7 g/dL (ref 0.4–1.8)
Globulin, Total: 2.3 g/dL (ref 2.2–3.9)
IgA: 39 mg/dL — ABNORMAL LOW (ref 61–437)
IgG (Immunoglobin G), Serum: 813 mg/dL (ref 603–1613)
IgM (Immunoglobulin M), Srm: 18 mg/dL (ref 15–143)
M Protein SerPl Elph-Mcnc: 0.3 g/dL — ABNORMAL HIGH
Total Protein ELP: 5.5 g/dL — ABNORMAL LOW (ref 6.0–8.5)

## 2022-06-13 ENCOUNTER — Inpatient Hospital Stay: Payer: Medicare Other

## 2022-06-13 ENCOUNTER — Other Ambulatory Visit: Payer: Self-pay

## 2022-06-13 ENCOUNTER — Telehealth: Payer: Self-pay | Admitting: Family Medicine

## 2022-06-13 VITALS — BP 147/79 | HR 56 | Temp 97.9°F | Resp 18 | Wt 218.8 lb

## 2022-06-13 DIAGNOSIS — C9000 Multiple myeloma not having achieved remission: Secondary | ICD-10-CM

## 2022-06-13 DIAGNOSIS — Z7189 Other specified counseling: Secondary | ICD-10-CM

## 2022-06-13 DIAGNOSIS — Z5112 Encounter for antineoplastic immunotherapy: Secondary | ICD-10-CM | POA: Diagnosis not present

## 2022-06-13 LAB — CBC WITH DIFFERENTIAL (CANCER CENTER ONLY)
Abs Immature Granulocytes: 0.01 10*3/uL (ref 0.00–0.07)
Basophils Absolute: 0 10*3/uL (ref 0.0–0.1)
Basophils Relative: 1 %
Eosinophils Absolute: 0.2 10*3/uL (ref 0.0–0.5)
Eosinophils Relative: 8 %
HCT: 36 % — ABNORMAL LOW (ref 39.0–52.0)
Hemoglobin: 12.1 g/dL — ABNORMAL LOW (ref 13.0–17.0)
Immature Granulocytes: 0 %
Lymphocytes Relative: 20 %
Lymphs Abs: 0.6 10*3/uL — ABNORMAL LOW (ref 0.7–4.0)
MCH: 27.8 pg (ref 26.0–34.0)
MCHC: 33.6 g/dL (ref 30.0–36.0)
MCV: 82.6 fL (ref 80.0–100.0)
Monocytes Absolute: 0.4 10*3/uL (ref 0.1–1.0)
Monocytes Relative: 13 %
Neutro Abs: 1.7 10*3/uL (ref 1.7–7.7)
Neutrophils Relative %: 58 %
Platelet Count: 117 10*3/uL — ABNORMAL LOW (ref 150–400)
RBC: 4.36 MIL/uL (ref 4.22–5.81)
RDW: 15.9 % — ABNORMAL HIGH (ref 11.5–15.5)
WBC Count: 2.9 10*3/uL — ABNORMAL LOW (ref 4.0–10.5)
nRBC: 0 % (ref 0.0–0.2)

## 2022-06-13 LAB — CMP (CANCER CENTER ONLY)
ALT: 24 U/L (ref 0–44)
AST: 24 U/L (ref 15–41)
Albumin: 3.7 g/dL (ref 3.5–5.0)
Alkaline Phosphatase: 34 U/L — ABNORMAL LOW (ref 38–126)
Anion gap: 6 (ref 5–15)
BUN: 11 mg/dL (ref 8–23)
CO2: 25 mmol/L (ref 22–32)
Calcium: 9.8 mg/dL (ref 8.9–10.3)
Chloride: 107 mmol/L (ref 98–111)
Creatinine: 1.02 mg/dL (ref 0.61–1.24)
GFR, Estimated: >60 mL/min (ref >60)
Glucose, Bld: 109 mg/dL — ABNORMAL HIGH (ref 70–99)
Potassium: 4 mmol/L (ref 3.5–5.1)
Sodium: 138 mmol/L (ref 135–145)
Total Bilirubin: 1.1 mg/dL (ref 0.3–1.2)
Total Protein: 5.7 g/dL — ABNORMAL LOW (ref 6.5–8.1)

## 2022-06-13 MED ORDER — BORTEZOMIB CHEMO SQ INJECTION 3.5 MG (2.5MG/ML)
1.5000 mg/m2 | Freq: Once | INTRAMUSCULAR | Status: AC
  Administered 2022-06-13: 3.5 mg via SUBCUTANEOUS
  Filled 2022-06-13: qty 1.4

## 2022-06-13 MED ORDER — ERGOCALCIFEROL 1.25 MG (50000 UT) PO CAPS
1.0000 | ORAL_CAPSULE | ORAL | 0 refills | Status: DC
Start: 2022-06-13 — End: 2022-11-10

## 2022-06-13 MED ORDER — DEXAMETHASONE 4 MG PO TABS: 20.0000 mg | ORAL_TABLET | Freq: Once | ORAL | Status: DC

## 2022-06-13 MED ORDER — ONDANSETRON HCL 8 MG PO TABS
8.0000 mg | ORAL_TABLET | Freq: Once | ORAL | Status: DC
  Filled 2022-06-13: qty 1

## 2022-06-13 MED ORDER — ZOLEDRONIC ACID 4 MG/100ML IV SOLN
4.0000 mg | Freq: Once | INTRAVENOUS | Status: AC
  Administered 2022-06-13: 4 mg via INTRAVENOUS
  Filled 2022-06-13: qty 100

## 2022-06-13 MED ORDER — SODIUM CHLORIDE 0.9 % IV SOLN: Freq: Once | INTRAVENOUS | Status: AC

## 2022-06-13 NOTE — Patient Instructions (Signed)
[facility] ONCOLOGY  Discharge Instructions: Thank you for choosing [facility] to provide your oncology and hematology care.   If you have a lab appointment with the [facility], please go directly to the [facility] and check in at the registration area.   Wear comfortable clothing and clothing appropriate for easy access to any Portacath or PICC line.   We strive to give you quality time with your provider. You may need to reschedule your appointment if you arrive late (15 or more minutes).  Arriving late affects you and other patients whose appointments are after yours.  Also, if you miss three or more appointments without notifying the office, you may be dismissed from the clinic at the provider's discretion.      For prescription refill requests, have your pharmacy contact our office and allow 72 hours for refills to be completed.    Today you received the following chemotherapy and/or immunotherapy agent:Velcade      To help prevent nausea and vomiting after your treatment, we encourage you to take your nausea medication as directed.  BELOW ARE SYMPTOMS THAT SHOULD BE REPORTED IMMEDIATELY: *FEVER GREATER THAN 100.4 F (38 C) OR HIGHER *CHILLS OR SWEATING *NAUSEA AND VOMITING THAT IS NOT CONTROLLED WITH YOUR NAUSEA MEDICATION *UNUSUAL SHORTNESS OF BREATH *UNUSUAL BRUISING OR BLEEDING *URINARY PROBLEMS (pain or burning when urinating, or frequent urination) *BOWEL PROBLEMS (unusual diarrhea, constipation, pain near the anus) TENDERNESS IN MOUTH AND THROAT WITH OR WITHOUT PRESENCE OF ULCERS (sore throat, sores in mouth, or a toothache) UNUSUAL RASH, SWELLING OR PAIN  UNUSUAL VAGINAL DISCHARGE OR ITCHING   Items with * indicate a potential emergency and should be followed up as soon as possible or go to the Emergency Department if any problems should occur.  Please show the CHEMOTHERAPY ALERT CARD or IMMUNOTHERAPY ALERT CARD at check-in to the  Emergency Department and triage nurse.  Should you have questions after your visit or need to cancel or reschedule your appointment, please contact [facility]  Dept: [phone]  and follow the prompts.  Office hours are 8:00 a.m. to 4:30 p.m. Monday - Friday. Please note that voicemails left after 4:00 p.m. may not be returned until the following business day.  We are closed weekends and major holidays. You have access to a nurse at all times for urgent questions. Please call the main number to the clinic Dept: [phone] and follow the prompts.   For any non-urgent questions, you may also contact your provider using MyChart. We now offer e-Visits for anyone 86 and older to request care online for non-urgent symptoms. For details visit [email or web address].   Also download the MyChart app! Go to the app store, search "MyChart", open the app, select [facility], and log in with your MyChart username and password.  Masks are optional in the cancer centers. If you would like for your care team to wear a mask while they are taking care of you, please let them know. For doctor visits, patients may have with them one support person who is at least 76 years old. At this time, visitors are not allowed in the infusion area.

## 2022-06-13 NOTE — Telephone Encounter (Signed)
Encourage patient to contact the pharmacy for refills or they can request refills through [facility]  (Please schedule appointment if patient has not been seen in over a year)    WHAT PHARMACY WOULD THEY LIKE THIS SENT TO: [facility] in [place], [place] - [address]   MEDICATION NAME & DOSE:Ergocalciferol (Vitamin D2) 1.25 mg (50000 UT) capsule   NOTES/COMMENTS FROM PATIENT: Pt states that he has been on this medication for years. Pt want to know if Dr.Greene can refill this medication for him. Pt states that he took his last capsule this week. Pt is ware that may need appt for this medication.       [facility] office please notify patient: It takes 48-72 hours to process rx refill requests Ask patient to call pharmacy to ensure rx is ready before heading there.

## 2022-06-13 NOTE — Telephone Encounter (Signed)
Last vitamin D No results found for: "25OHVITD2", "25OHVITD3", "VD25OH" Looks like he did have a vitamin D level in July 2022, with a reading of 50.  If he does continue to remain on the 50,000 unit dose and was taking that dose at that time I will refill it until his visit with me in December.  Should check updated labs at that time.  Let me know if there are questions.

## 2022-06-13 NOTE — Progress Notes (Signed)
Patient took his steroids and zofran at 1353 today. Patient due for zometa today- he states that he has dental clearance and his corrected Ca is 10.04 and Scr is 1.02

## 2022-06-13 NOTE — Telephone Encounter (Signed)
Pt states that he has been on this medication for years. Pt want to know if Dr.Greene can refill this medication for him. Pt states that he took his last capsule this week. Pt is ware that may need appt for this medication.    patient has not been on this medication since 2014. Attempted to call patient back

## 2022-06-16 ENCOUNTER — Other Ambulatory Visit: Payer: Self-pay

## 2022-06-18 ENCOUNTER — Other Ambulatory Visit: Payer: Self-pay

## 2022-06-18 DIAGNOSIS — C9000 Multiple myeloma not having achieved remission: Secondary | ICD-10-CM

## 2022-06-20 ENCOUNTER — Other Ambulatory Visit: Payer: Medicare Other

## 2022-06-20 ENCOUNTER — Other Ambulatory Visit: Payer: Self-pay

## 2022-06-20 ENCOUNTER — Inpatient Hospital Stay: Payer: Medicare Other

## 2022-06-20 ENCOUNTER — Other Ambulatory Visit: Payer: Self-pay | Admitting: Hematology

## 2022-06-20 ENCOUNTER — Inpatient Hospital Stay (HOSPITAL_BASED_OUTPATIENT_CLINIC_OR_DEPARTMENT_OTHER): Payer: Medicare Other | Admitting: Hematology

## 2022-06-20 VITALS — BP 147/72 | HR 62 | Temp 97.9°F | Resp 17 | Ht 73.0 in | Wt 215.4 lb

## 2022-06-20 VITALS — BP 139/68 | HR 58 | Temp 98.4°F | Resp 16

## 2022-06-20 DIAGNOSIS — C9000 Multiple myeloma not having achieved remission: Secondary | ICD-10-CM | POA: Diagnosis not present

## 2022-06-20 DIAGNOSIS — Z5112 Encounter for antineoplastic immunotherapy: Secondary | ICD-10-CM | POA: Diagnosis not present

## 2022-06-20 DIAGNOSIS — M7989 Other specified soft tissue disorders: Secondary | ICD-10-CM | POA: Diagnosis not present

## 2022-06-20 DIAGNOSIS — Z5111 Encounter for antineoplastic chemotherapy: Secondary | ICD-10-CM

## 2022-06-20 DIAGNOSIS — Z7189 Other specified counseling: Secondary | ICD-10-CM

## 2022-06-20 LAB — CBC WITH DIFFERENTIAL (CANCER CENTER ONLY)
Abs Immature Granulocytes: 0.01 10*3/uL (ref 0.00–0.07)
Basophils Absolute: 0 10*3/uL (ref 0.0–0.1)
Basophils Relative: 1 %
Eosinophils Absolute: 0.2 10*3/uL (ref 0.0–0.5)
Eosinophils Relative: 5 %
HCT: 36.1 % — ABNORMAL LOW (ref 39.0–52.0)
Hemoglobin: 12 g/dL — ABNORMAL LOW (ref 13.0–17.0)
Immature Granulocytes: 0 %
Lymphocytes Relative: 15 %
Lymphs Abs: 0.7 10*3/uL (ref 0.7–4.0)
MCH: 27.6 pg (ref 26.0–34.0)
MCHC: 33.2 g/dL (ref 30.0–36.0)
MCV: 83.2 fL (ref 80.0–100.0)
Monocytes Absolute: 0.9 10*3/uL (ref 0.1–1.0)
Monocytes Relative: 19 %
Neutro Abs: 2.7 10*3/uL (ref 1.7–7.7)
Neutrophils Relative %: 60 %
Platelet Count: 106 10*3/uL — ABNORMAL LOW (ref 150–400)
RBC: 4.34 MIL/uL (ref 4.22–5.81)
RDW: 16.2 % — ABNORMAL HIGH (ref 11.5–15.5)
WBC Count: 4.6 10*3/uL (ref 4.0–10.5)
nRBC: 0 % (ref 0.0–0.2)

## 2022-06-20 LAB — CMP (CANCER CENTER ONLY)
ALT: 25 U/L (ref 0–44)
AST: 22 U/L (ref 15–41)
Albumin: 3.7 g/dL (ref 3.5–5.0)
Alkaline Phosphatase: 35 U/L — ABNORMAL LOW (ref 38–126)
Anion gap: 4 — ABNORMAL LOW (ref 5–15)
BUN: 12 mg/dL (ref 8–23)
CO2: 28 mmol/L (ref 22–32)
Calcium: 9.5 mg/dL (ref 8.9–10.3)
Chloride: 107 mmol/L (ref 98–111)
Creatinine: 1.01 mg/dL (ref 0.61–1.24)
GFR, Estimated: >60 mL/min (ref >60)
Glucose, Bld: 106 mg/dL — ABNORMAL HIGH (ref 70–99)
Potassium: 3.8 mmol/L (ref 3.5–5.1)
Sodium: 139 mmol/L (ref 135–145)
Total Bilirubin: 1.1 mg/dL (ref 0.3–1.2)
Total Protein: 6.1 g/dL — ABNORMAL LOW (ref 6.5–8.1)

## 2022-06-20 MED ORDER — BORTEZOMIB CHEMO SQ INJECTION 3.5 MG (2.5MG/ML)
1.5000 mg/m2 | Freq: Once | INTRAMUSCULAR | Status: AC
  Administered 2022-06-20: 3.5 mg via SUBCUTANEOUS
  Filled 2022-06-20: qty 1.4

## 2022-06-20 MED ORDER — ONDANSETRON HCL 8 MG PO TABS: 8.0000 mg | ORAL_TABLET | Freq: Once | ORAL | Status: DC

## 2022-06-20 MED ORDER — DEXAMETHASONE 4 MG PO TABS: 20.0000 mg | ORAL_TABLET | Freq: Once | ORAL | Status: DC

## 2022-06-20 NOTE — Progress Notes (Signed)
[facility]   HEMATOLOGY/ONCOLOGY CLINIC NOTE   Date of Service: 06/20/2022  Patient Care Team: [name], MD as PCP - General (Family Medicine) [name], MD as Consulting Physician (Hematology)  CHIEF COMPLAINTS/PURPOSE OF CONSULTATION:  Follow-up for continued evaluation and management of multiple myeloma  HISTORY OF PRESENTING ILLNESS:  Please see previous note for details of initial presentation  INTERVAL HISTORY Nathan Martinez is a 76 y.o. male here for continued evaluation and management of multiple myeloma and follow-up prior to C5D15 of RVD. He reports He is doing well with no new symptoms or concerns.  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 We discussed monitoring these symptoms and managing as needed.  He notes grade 1 constipation which he takes prune juice for. He notes that Miralax in the morning is helping manage this.  He is tolerating the current dose Revlimid at 25 mg p.o daily with no prohibitive toxicities at this time.  We discussed consolidative treatments such as transplant options.  He notes that he had several styes drained in both eyes and further reports improvement in symptoms.  No fevers no chills no night sweats.  No significant new fatigue. No loose stools or change in bowel habits. No other new or acute focal symptoms.  No other notable toxicities from his current treatment.    Labs done today were reviewed in detail with the patient.  MEDICAL HISTORY:  Intermittent hypercalcemia - primary hyperparathyroidism status post parathyroidectomy in 2019 HTN HLD Glaucoma History of Helicobacter pylori infection previously treated Chronic leukopenia/neutropenia History of present iron deficiency anemia  SURGICAL HISTORY: Rt TKA 2018 Parathyroidectomy 2019 B/l cataracts sx 2021  SOCIAL HISTORY: Social History   Socioeconomic History   Marital status: Married    Spouse name: Not on  file   Number of children: Not on file   Years of education: Not on file   Highest education level: Not on file  Occupational History   Not on file  Tobacco Use   Smoking status: Never   Smokeless tobacco: Never  Vaping Use   Vaping Use: Never used  Substance and Sexual Activity   Alcohol use: Not Currently   Drug use: Never   Sexual activity: Not Currently    Birth control/protection: None  Other Topics Concern   Not on file  Social History Narrative   Not on file   Social Determinants of Health   Financial Resource Strain: Not on file  Food Insecurity: Not on file  Transportation Needs: Not on file  Physical Activity: Not on file  Stress: Not on file  Social Connections: Not on file  Intimate Partner Violence: Not on file  Ex smoker (quit 1976) Ex secret service agent ETOH social use  FAMILY HISTORY: Sister - uterine/ovarian cancer 59's  Mother-- lived into her 63's Father - died in 46's - fall/trauma  ALLERGIES:  has No Known Allergies.  MEDICATIONS:  Krill oil Olmesartan hydrochlorothiazide 20-12.5 mg Ferrex 150 mg p.o. daily Aspirin 81 mg p.o. daily Atorvastatin 20 mg p.o. daily Multivitamin 1 tablet p.o. daily Ergocalciferol 50,000 units once weekly   REVIEW OF SYSTEMS:   10 Point review of Systems was done is negative except as noted above.  PHYSICAL EXAMINATION: .BP (!) 147/72 (BP Location: Left Arm, Patient Position: Sitting) Comment: Nurse notified  Pulse 62   Temp 97.9 F (36.6 C) (Temporal)   Resp 17   Ht 6' 1" (1.854 m)   Wt 215  lb 6.4 oz (97.7 kg)   SpO2 100%   BMI 28.42 kg/m  NAD GENERAL:alert, in no acute distress and comfortable SKIN: no acute rashes, no significant lesions EYES: conjunctiva are pink and non-injected, sclera anicteric NECK: supple, no JVD LYMPH:  no palpable lymphadenopathy in the cervical, axillary or inguinal regions LUNGS: clear to auscultation b/l with normal respiratory effort HEART: regular rate &  rhythm ABDOMEN:  normoactive bowel sounds , non tender, not distended. Extremity: no pedal edema PSYCH: alert & oriented x 3 with fluent speech NEURO: no focal motor/sensory deficits  LABORATORY DATA:  I have reviewed the data as listed  .    Latest Ref Rng & Units 06/20/2022   12:32 PM 06/13/2022    1:19 PM 06/06/2022   12:50 PM  CBC  WBC 4.0 - 10.5 K/uL 4.6  2.9  3.9   Hemoglobin 13.0 - 17.0 g/dL 12.0  12.1  11.9   Hematocrit 39.0 - 52.0 % 36.1  36.0  35.8   Platelets 150 - 400 K/uL 106  117  161    .CBC    Component Value Date/Time   WBC 4.6 06/20/2022 1232   WBC 3.9 (L) 06/06/2022 1250   RBC 4.34 06/20/2022 1232   HGB 12.0 (L) 06/20/2022 1232   HCT 36.1 (L) 06/20/2022 1232   PLT 106 (L) 06/20/2022 1232   MCV 83.2 06/20/2022 1232   MCH 27.6 06/20/2022 1232   MCHC 33.2 06/20/2022 1232   RDW 16.2 (H) 06/20/2022 1232   LYMPHSABS 0.7 06/20/2022 1232   MONOABS 0.9 06/20/2022 1232   EOSABS 0.2 06/20/2022 1232   BASOSABS 0.0 06/20/2022 1232    .    Latest Ref Rng & Units 06/13/2022    1:19 PM 06/06/2022   12:50 PM 05/30/2022   12:48 PM  CMP  Glucose 70 - 99 mg/dL 109  93  106   BUN 8 - 23 mg/dL _0 Creatinine 0.61 - 1.24 mg/dL 1.02  1.02  1.06   Sodium 135 - 145 mmol/L 138  141  140   Potassium 3.5 - 5.1 mmol/L 4.0  4.1  4.0   Chloride 98 - 111 mmol/L 107  110  109   CO2 22 - 32 mmol/L _1 Calcium 8.9 - 10.3 mg/dL 9.8  10.0  9.7   Total Protein 6.5 - 8.1 g/dL 5.7  5.9  6.2   Total Bilirubin 0.3 - 1.2 mg/dL 1.1  0.7  0.6   Alkaline Phos 38 - 126 U/L 34  39  43   AST 15 - 41 U/L _2 ALT 0 - 44 U/L _3 RADIOGRAPHIC STUDIES:    ASSESSMENT & PLAN:   76 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  PLAN -Labs done today were reviewed in detail with the patient as noted above CBC stable wih Hgb of 12, WBC count of 4.6k, and platelets of 106k. CMP unremarkable. MM panel done 05/07/2022 shows improved  M protein spike of 0.5 g/dL. -Patient notes no notable toxicities from his current RVD regimen. Orders reviewed and placed. -conitnue same supportive medications. -Appropriate to proceed with next cycle 5D15 of RVD. -Continue Revlimid to 25 mg 3 weeks on 1 week off. -Continue monthly Zometa -Continue vitamin D at least 2000 units daily. -We discussed trying Marinol for a month to see if appetite improves. -Recommended to take a wetwipe with johnson's baby soap or an equivalent gentle soap to clean the eyes daily and avoid crusting.  4) leg swelling due to steroids -patient using compression socks -Continue low dose Lasix. He can reduce taking Lasix to 3 days and if leg swelling continues to be well managed then he can stop it completely and only take as needed.  Follow-up Plz schedule C6 of RVD treatment Labs with each treatment RTC with Dr Irene Limbo on [phone] Referral to [facility] for consideration of HDT-HSCT  The total time spent in the appointment was 31 minutes*.  All of the patient's questions were answered with apparent satisfaction. The patient knows to call the clinic with any problems, questions or concerns.   Sullivan Lone MD MS AAHIVMS [facility] Hematology/Oncology Physician [facility]  .*Total Encounter Time as defined by the Centers for Medicare and Medicaid Services includes, in addition to the face-to-face time of a patient visit (documented in the note above) non-face-to-face time: obtaining and  reviewing outside history, ordering and reviewing medications, tests or procedures, care coordination (communications with other health care professionals or caregivers) and documentation in the medical record.  I, Melene Muller, am acting as scribe for Dr. Sullivan Lone, MD.  .I have reviewed the above documentation for accuracy and completeness, and I agree with the above.[name] MD

## 2022-06-20 NOTE — Patient Instructions (Signed)
[facility]  Discharge Instructions: Thank you for choosing [facility] to provide your oncology and hematology care.   If you have a lab appointment with the [facility], please go directly to the [facility] and check in at the registration area.   Wear comfortable clothing and clothing appropriate for easy access to any Portacath or PICC line.   We strive to give you quality time with your provider. You may need to reschedule your appointment if you arrive late (15 or more minutes).  Arriving late affects you and other patients whose appointments are after yours.  Also, if you miss three or more appointments without notifying the office, you may be dismissed from the clinic at the provider's discretion.      For prescription refill requests, have your pharmacy contact our office and allow 72 hours for refills to be completed.    Today you received the following chemotherapy and/or immunotherapy agent: Velcade   To help prevent nausea and vomiting after your treatment, we encourage you to take your nausea medication as directed.  BELOW ARE SYMPTOMS THAT SHOULD BE REPORTED IMMEDIATELY: *FEVER GREATER THAN 100.4 F (38 C) OR HIGHER *CHILLS OR SWEATING *NAUSEA AND VOMITING THAT IS NOT CONTROLLED WITH YOUR NAUSEA MEDICATION *UNUSUAL SHORTNESS OF BREATH *UNUSUAL BRUISING OR BLEEDING *URINARY PROBLEMS (pain or burning when urinating, or frequent urination) *BOWEL PROBLEMS (unusual diarrhea, constipation, pain near the anus) TENDERNESS IN MOUTH AND THROAT WITH OR WITHOUT PRESENCE OF ULCERS (sore throat, sores in mouth, or a toothache) UNUSUAL RASH, SWELLING OR PAIN  UNUSUAL VAGINAL DISCHARGE OR ITCHING   Items with * indicate a potential emergency and should be followed up as soon as possible or go to the Emergency Department if any problems should occur.  Please show the CHEMOTHERAPY ALERT CARD or IMMUNOTHERAPY ALERT CARD at check-in to the  Emergency Department and triage nurse.  Should you have questions after your visit or need to cancel or reschedule your appointment, please contact [facility]  Dept: [phone]  and follow the prompts.  Office hours are 8:00 a.m. to 4:30 p.m. Monday - Friday. Please note that voicemails left after 4:00 p.m. may not be returned until the following business day.  We are closed weekends and major holidays. You have access to a nurse at all times for urgent questions. Please call the main number to the clinic Dept: [phone] and follow the prompts.   For any non-urgent questions, you may also contact your provider using MyChart. We now offer e-Visits for anyone 76 and older to request care online for non-urgent symptoms. For details visit [email or web address].   Also download the MyChart app! Go to the app store, search "MyChart", open the app, select [facility], and log in with your MyChart username and password.  Masks are optional in the cancer centers. If you would like for your care team to wear a mask while they are taking care of you, please let them know. For doctor visits, patients may have with them one support person who is at least 76 years old. At this time, visitors are not allowed in the infusion area.

## 2022-06-20 NOTE — Progress Notes (Signed)
Patient took dexamethasone and Zofran around 12:30pm today

## 2022-06-26 ENCOUNTER — Other Ambulatory Visit: Payer: Self-pay

## 2022-06-26 DIAGNOSIS — C9000 Multiple myeloma not having achieved remission: Secondary | ICD-10-CM

## 2022-06-27 ENCOUNTER — Encounter: Payer: Self-pay | Admitting: Hematology

## 2022-06-27 ENCOUNTER — Other Ambulatory Visit: Payer: Self-pay

## 2022-06-27 ENCOUNTER — Inpatient Hospital Stay: Payer: Medicare Other

## 2022-06-27 ENCOUNTER — Inpatient Hospital Stay: Payer: Medicare Other | Attending: Hematology

## 2022-06-27 VITALS — BP 146/70 | HR 58 | Temp 98.2°F | Resp 18 | Wt 215.4 lb

## 2022-06-27 DIAGNOSIS — Z5112 Encounter for antineoplastic immunotherapy: Secondary | ICD-10-CM | POA: Diagnosis present

## 2022-06-27 DIAGNOSIS — C9000 Multiple myeloma not having achieved remission: Secondary | ICD-10-CM | POA: Insufficient documentation

## 2022-06-27 DIAGNOSIS — Z7189 Other specified counseling: Secondary | ICD-10-CM

## 2022-06-27 LAB — CMP (CANCER CENTER ONLY)
ALT: 26 U/L (ref 0–44)
AST: 22 U/L (ref 15–41)
Albumin: 3.7 g/dL (ref 3.5–5.0)
Alkaline Phosphatase: 39 U/L (ref 38–126)
Anion gap: 3 — ABNORMAL LOW (ref 5–15)
BUN: 14 mg/dL (ref 8–23)
CO2: 29 mmol/L (ref 22–32)
Calcium: 9.4 mg/dL (ref 8.9–10.3)
Chloride: 107 mmol/L (ref 98–111)
Creatinine: 1 mg/dL (ref 0.61–1.24)
GFR, Estimated: >60 mL/min (ref >60)
Glucose, Bld: 96 mg/dL (ref 70–99)
Potassium: 4.1 mmol/L (ref 3.5–5.1)
Sodium: 139 mmol/L (ref 135–145)
Total Bilirubin: 0.8 mg/dL (ref 0.3–1.2)
Total Protein: 6.2 g/dL — ABNORMAL LOW (ref 6.5–8.1)

## 2022-06-27 LAB — CBC WITH DIFFERENTIAL (CANCER CENTER ONLY)
Abs Immature Granulocytes: 0.01 10*3/uL (ref 0.00–0.07)
Basophils Absolute: 0 10*3/uL (ref 0.0–0.1)
Basophils Relative: 0 %
Eosinophils Absolute: 0.2 10*3/uL (ref 0.0–0.5)
Eosinophils Relative: 5 %
HCT: 36.6 % — ABNORMAL LOW (ref 39.0–52.0)
Hemoglobin: 12.1 g/dL — ABNORMAL LOW (ref 13.0–17.0)
Immature Granulocytes: 0 %
Lymphocytes Relative: 19 %
Lymphs Abs: 0.9 10*3/uL (ref 0.7–4.0)
MCH: 27.6 pg (ref 26.0–34.0)
MCHC: 33.1 g/dL (ref 30.0–36.0)
MCV: 83.4 fL (ref 80.0–100.0)
Monocytes Absolute: 0.7 10*3/uL (ref 0.1–1.0)
Monocytes Relative: 14 %
Neutro Abs: 2.8 10*3/uL (ref 1.7–7.7)
Neutrophils Relative %: 62 %
Platelet Count: 172 10*3/uL (ref 150–400)
RBC: 4.39 MIL/uL (ref 4.22–5.81)
RDW: 16.6 % — ABNORMAL HIGH (ref 11.5–15.5)
WBC Count: 4.6 10*3/uL (ref 4.0–10.5)
nRBC: 0 % (ref 0.0–0.2)

## 2022-06-27 MED ORDER — BORTEZOMIB CHEMO SQ INJECTION 3.5 MG (2.5MG/ML)
1.5000 mg/m2 | Freq: Once | INTRAMUSCULAR | Status: AC
  Administered 2022-06-27: 3.5 mg via SUBCUTANEOUS
  Filled 2022-06-27: qty 1.4

## 2022-06-27 NOTE — Patient Instructions (Signed)
[facility]  Discharge Instructions: Thank you for choosing [facility] to provide your oncology and hematology care.   If you have a lab appointment with the Cancer Center, please go directly to the Cancer Center and check in at the registration area.   Wear comfortable clothing and clothing appropriate for easy access to any Portacath or PICC line.   We strive to give you quality time with your provider. You may need to reschedule your appointment if you arrive late (15 or more minutes).  Arriving late affects you and other patients whose appointments are after yours.  Also, if you miss three or more appointments without notifying the office, you may be dismissed from the clinic at the provider's discretion.      For prescription refill requests, have your pharmacy contact our office and allow 72 hours for refills to be completed.    Today you received the following chemotherapy and/or immunotherapy agents : Velcade     To help prevent nausea and vomiting after your treatment, we encourage you to take your nausea medication as directed.  BELOW ARE SYMPTOMS THAT SHOULD BE REPORTED IMMEDIATELY: *FEVER GREATER THAN 100.4 F (38 C) OR HIGHER *CHILLS OR SWEATING *NAUSEA AND VOMITING THAT IS NOT CONTROLLED WITH YOUR NAUSEA MEDICATION *UNUSUAL SHORTNESS OF BREATH *UNUSUAL BRUISING OR BLEEDING *URINARY PROBLEMS (pain or burning when urinating, or frequent urination) *BOWEL PROBLEMS (unusual diarrhea, constipation, pain near the anus) TENDERNESS IN MOUTH AND THROAT WITH OR WITHOUT PRESENCE OF ULCERS (sore throat, sores in mouth, or a toothache) UNUSUAL RASH, SWELLING OR PAIN  UNUSUAL VAGINAL DISCHARGE OR ITCHING   Items with * indicate a potential emergency and should be followed up as soon as possible or go to the Emergency Department if any problems should occur.  Please show the CHEMOTHERAPY ALERT CARD or IMMUNOTHERAPY ALERT CARD at check-in to  the Emergency Department and triage nurse.  Should you have questions after your visit or need to cancel or reschedule your appointment, please contact [facility]  Dept: [phone]  and follow the prompts.  Office hours are 8:00 a.m. to 4:30 p.m. Monday - Friday. Please note that voicemails left after 4:00 p.m. may not be returned until the following business day.  We are closed weekends and major holidays. You have access to a nurse at all times for urgent questions. Please call the main number to the clinic Dept: [phone] and follow the prompts.   For any non-urgent questions, you may also contact your provider using MyChart. We now offer e-Visits for anyone 18 and older to request care online for non-urgent symptoms. For details visit [email or web address].   Also download the MyChart app! Go to the app store, search "MyChart", open the app, select , and log in with your MyChart username and password.  Masks are optional in the cancer centers. If you would like for your care team to wear a mask while they are taking care of you, please let them know. You may have one support person who is at least 76 years old accompany you for your appointments. 

## 2022-06-27 NOTE — Progress Notes (Signed)
Pt states he took decadron '40mg'$  at home prior to arriving for infusion. Pt declined Zofran prior to injection, stating he has the medication at home and has not needed it in the past.

## 2022-06-27 NOTE — Progress Notes (Signed)
Referral faxed to [facility]/Dr Lambird per Dr. Irene Limbo

## 2022-07-04 ENCOUNTER — Other Ambulatory Visit: Payer: Self-pay

## 2022-07-04 ENCOUNTER — Inpatient Hospital Stay: Payer: Medicare Other

## 2022-07-04 ENCOUNTER — Other Ambulatory Visit: Payer: Self-pay | Admitting: Hematology

## 2022-07-04 ENCOUNTER — Other Ambulatory Visit: Payer: Self-pay | Admitting: *Deleted

## 2022-07-04 VITALS — BP 132/77 | HR 66 | Temp 98.0°F | Resp 18 | Wt 212.4 lb

## 2022-07-04 DIAGNOSIS — C9000 Multiple myeloma not having achieved remission: Secondary | ICD-10-CM

## 2022-07-04 DIAGNOSIS — Z5112 Encounter for antineoplastic immunotherapy: Secondary | ICD-10-CM | POA: Diagnosis not present

## 2022-07-04 DIAGNOSIS — Z7189 Other specified counseling: Secondary | ICD-10-CM

## 2022-07-04 LAB — CBC WITH DIFFERENTIAL (CANCER CENTER ONLY)
Abs Immature Granulocytes: 0.01 10*3/uL (ref 0.00–0.07)
Basophils Absolute: 0.1 10*3/uL (ref 0.0–0.1)
Basophils Relative: 1 %
Eosinophils Absolute: 0.1 10*3/uL (ref 0.0–0.5)
Eosinophils Relative: 2 %
HCT: 36.1 % — ABNORMAL LOW (ref 39.0–52.0)
Hemoglobin: 12.2 g/dL — ABNORMAL LOW (ref 13.0–17.0)
Immature Granulocytes: 0 %
Lymphocytes Relative: 19 %
Lymphs Abs: 0.7 10*3/uL (ref 0.7–4.0)
MCH: 27.6 pg (ref 26.0–34.0)
MCHC: 33.8 g/dL (ref 30.0–36.0)
MCV: 81.7 fL (ref 80.0–100.0)
Monocytes Absolute: 0.5 10*3/uL (ref 0.1–1.0)
Monocytes Relative: 12 %
Neutro Abs: 2.5 10*3/uL (ref 1.7–7.7)
Neutrophils Relative %: 66 %
Platelet Count: 144 10*3/uL — ABNORMAL LOW (ref 150–400)
RBC: 4.42 MIL/uL (ref 4.22–5.81)
RDW: 16.7 % — ABNORMAL HIGH (ref 11.5–15.5)
WBC Count: 3.8 10*3/uL — ABNORMAL LOW (ref 4.0–10.5)
nRBC: 0 % (ref 0.0–0.2)

## 2022-07-04 LAB — CMP (CANCER CENTER ONLY)
ALT: 25 U/L (ref 0–44)
AST: 23 U/L (ref 15–41)
Albumin: 3.7 g/dL (ref 3.5–5.0)
Alkaline Phosphatase: 36 U/L — ABNORMAL LOW (ref 38–126)
Anion gap: 4 — ABNORMAL LOW (ref 5–15)
BUN: 14 mg/dL (ref 8–23)
CO2: 26 mmol/L (ref 22–32)
Calcium: 9.4 mg/dL (ref 8.9–10.3)
Chloride: 107 mmol/L (ref 98–111)
Creatinine: 1.04 mg/dL (ref 0.61–1.24)
GFR, Estimated: >60 mL/min (ref >60)
Glucose, Bld: 124 mg/dL — ABNORMAL HIGH (ref 70–99)
Potassium: 4 mmol/L (ref 3.5–5.1)
Sodium: 137 mmol/L (ref 135–145)
Total Bilirubin: 0.7 mg/dL (ref 0.3–1.2)
Total Protein: 5.8 g/dL — ABNORMAL LOW (ref 6.5–8.1)

## 2022-07-04 MED ORDER — BORTEZOMIB CHEMO SQ INJECTION 3.5 MG (2.5MG/ML)
1.5000 mg/m2 | Freq: Once | INTRAMUSCULAR | Status: AC
  Administered 2022-07-04: 3.5 mg via SUBCUTANEOUS
  Filled 2022-07-04: qty 1.4

## 2022-07-04 MED ORDER — ONDANSETRON HCL 8 MG PO TABS: 8.0000 mg | ORAL_TABLET | Freq: Once | ORAL | Status: DC

## 2022-07-04 MED ORDER — DEXAMETHASONE 4 MG PO TABS: 20.0000 mg | ORAL_TABLET | Freq: Once | ORAL | Status: DC

## 2022-07-04 NOTE — Patient Instructions (Signed)
[facility]  Discharge Instructions: Thank you for choosing [facility] to provide your oncology and hematology care.   If you have a lab appointment with the Cancer Center, please go directly to the Cancer Center and check in at the registration area.   Wear comfortable clothing and clothing appropriate for easy access to any Portacath or PICC line.   We strive to give you quality time with your provider. You may need to reschedule your appointment if you arrive late (15 or more minutes).  Arriving late affects you and other patients whose appointments are after yours.  Also, if you miss three or more appointments without notifying the office, you may be dismissed from the clinic at the provider's discretion.      For prescription refill requests, have your pharmacy contact our office and allow 72 hours for refills to be completed.    Today you received the following chemotherapy and/or immunotherapy agents : Velcade     To help prevent nausea and vomiting after your treatment, we encourage you to take your nausea medication as directed.  BELOW ARE SYMPTOMS THAT SHOULD BE REPORTED IMMEDIATELY: *FEVER GREATER THAN 100.4 F (38 C) OR HIGHER *CHILLS OR SWEATING *NAUSEA AND VOMITING THAT IS NOT CONTROLLED WITH YOUR NAUSEA MEDICATION *UNUSUAL SHORTNESS OF BREATH *UNUSUAL BRUISING OR BLEEDING *URINARY PROBLEMS (pain or burning when urinating, or frequent urination) *BOWEL PROBLEMS (unusual diarrhea, constipation, pain near the anus) TENDERNESS IN MOUTH AND THROAT WITH OR WITHOUT PRESENCE OF ULCERS (sore throat, sores in mouth, or a toothache) UNUSUAL RASH, SWELLING OR PAIN  UNUSUAL VAGINAL DISCHARGE OR ITCHING   Items with * indicate a potential emergency and should be followed up as soon as possible or go to the Emergency Department if any problems should occur.  Please show the CHEMOTHERAPY ALERT CARD or IMMUNOTHERAPY ALERT CARD at check-in to  the Emergency Department and triage nurse.  Should you have questions after your visit or need to cancel or reschedule your appointment, please contact [facility]  Dept: [phone]  and follow the prompts.  Office hours are 8:00 a.m. to 4:30 p.m. Monday - Friday. Please note that voicemails left after 4:00 p.m. may not be returned until the following business day.  We are closed weekends and major holidays. You have access to a nurse at all times for urgent questions. Please call the main number to the clinic Dept: [phone] and follow the prompts.   For any non-urgent questions, you may also contact your provider using MyChart. We now offer e-Visits for anyone 18 and older to request care online for non-urgent symptoms. For details visit [email or web address].   Also download the MyChart app! Go to the app store, search "MyChart", open the app, select [facility], and log in with your MyChart username and password.  Masks are optional in the cancer centers. If you would like for your care team to wear a mask while they are taking care of you, please let them know. You may have one support person who is at least 76 years old accompany you for your appointments. 

## 2022-07-08 LAB — MULTIPLE MYELOMA PANEL, SERUM
Albumin SerPl Elph-Mcnc: 3.3 g/dL (ref 2.9–4.4)
Albumin/Glob SerPl: 1.6 (ref 0.7–1.7)
Alpha 1: 0.2 g/dL (ref 0.0–0.4)
Alpha2 Glob SerPl Elph-Mcnc: 0.6 g/dL (ref 0.4–1.0)
B-Globulin SerPl Elph-Mcnc: 0.7 g/dL (ref 0.7–1.3)
Gamma Glob SerPl Elph-Mcnc: 0.7 g/dL (ref 0.4–1.8)
Globulin, Total: 2.2 g/dL (ref 2.2–3.9)
IgA: 59 mg/dL — ABNORMAL LOW (ref 61–437)
IgG (Immunoglobin G), Serum: 808 mg/dL (ref 603–1613)
IgM (Immunoglobulin M), Srm: 18 mg/dL (ref 15–143)
Total Protein ELP: 5.5 g/dL — ABNORMAL LOW (ref 6.0–8.5)

## 2022-07-11 ENCOUNTER — Other Ambulatory Visit: Payer: Self-pay

## 2022-07-11 ENCOUNTER — Inpatient Hospital Stay: Payer: Medicare Other

## 2022-07-11 ENCOUNTER — Other Ambulatory Visit: Payer: Medicare Other

## 2022-07-11 VITALS — BP 160/77 | HR 61 | Temp 98.4°F | Resp 17 | Wt 213.5 lb

## 2022-07-11 DIAGNOSIS — C9000 Multiple myeloma not having achieved remission: Secondary | ICD-10-CM

## 2022-07-11 DIAGNOSIS — Z5112 Encounter for antineoplastic immunotherapy: Secondary | ICD-10-CM | POA: Diagnosis not present

## 2022-07-11 DIAGNOSIS — Z7189 Other specified counseling: Secondary | ICD-10-CM

## 2022-07-11 LAB — CBC WITH DIFFERENTIAL (CANCER CENTER ONLY)
Abs Immature Granulocytes: 0.01 10*3/uL (ref 0.00–0.07)
Basophils Absolute: 0 10*3/uL (ref 0.0–0.1)
Basophils Relative: 1 %
Eosinophils Absolute: 0.1 10*3/uL (ref 0.0–0.5)
Eosinophils Relative: 3 %
HCT: 36.6 % — ABNORMAL LOW (ref 39.0–52.0)
Hemoglobin: 12.2 g/dL — ABNORMAL LOW (ref 13.0–17.0)
Immature Granulocytes: 0 %
Lymphocytes Relative: 19 %
Lymphs Abs: 0.7 10*3/uL (ref 0.7–4.0)
MCH: 27.2 pg (ref 26.0–34.0)
MCHC: 33.3 g/dL (ref 30.0–36.0)
MCV: 81.5 fL (ref 80.0–100.0)
Monocytes Absolute: 0.3 10*3/uL (ref 0.1–1.0)
Monocytes Relative: 10 %
Neutro Abs: 2.3 10*3/uL (ref 1.7–7.7)
Neutrophils Relative %: 67 %
Platelet Count: 115 10*3/uL — ABNORMAL LOW (ref 150–400)
RBC: 4.49 MIL/uL (ref 4.22–5.81)
RDW: 17.4 % — ABNORMAL HIGH (ref 11.5–15.5)
WBC Count: 3.4 10*3/uL — ABNORMAL LOW (ref 4.0–10.5)
nRBC: 0 % (ref 0.0–0.2)

## 2022-07-11 LAB — CMP (CANCER CENTER ONLY)
ALT: 25 U/L (ref 0–44)
AST: 23 U/L (ref 15–41)
Albumin: 3.6 g/dL (ref 3.5–5.0)
Alkaline Phosphatase: 32 U/L — ABNORMAL LOW (ref 38–126)
Anion gap: 4 — ABNORMAL LOW (ref 5–15)
BUN: 16 mg/dL (ref 8–23)
CO2: 27 mmol/L (ref 22–32)
Calcium: 9.6 mg/dL (ref 8.9–10.3)
Chloride: 106 mmol/L (ref 98–111)
Creatinine: 1.08 mg/dL (ref 0.61–1.24)
GFR, Estimated: >60 mL/min (ref >60)
Glucose, Bld: 90 mg/dL (ref 70–99)
Potassium: 4.1 mmol/L (ref 3.5–5.1)
Sodium: 137 mmol/L (ref 135–145)
Total Bilirubin: 1 mg/dL (ref 0.3–1.2)
Total Protein: 5.7 g/dL — ABNORMAL LOW (ref 6.5–8.1)

## 2022-07-11 MED ORDER — SODIUM CHLORIDE 0.9 % IV SOLN: Freq: Once | INTRAVENOUS | Status: AC

## 2022-07-11 MED ORDER — ZOLEDRONIC ACID 4 MG/100ML IV SOLN
4.0000 mg | Freq: Once | INTRAVENOUS | Status: AC
  Administered 2022-07-11: 4 mg via INTRAVENOUS
  Filled 2022-07-11: qty 100

## 2022-07-11 MED ORDER — BORTEZOMIB CHEMO SQ INJECTION 3.5 MG (2.5MG/ML)
1.5000 mg/m2 | Freq: Once | INTRAMUSCULAR | Status: AC
  Administered 2022-07-11: 3.5 mg via SUBCUTANEOUS
  Filled 2022-07-11: qty 1.4

## 2022-07-11 MED ORDER — DEXAMETHASONE 4 MG PO TABS: 20.0000 mg | ORAL_TABLET | Freq: Once | ORAL | Status: DC

## 2022-07-11 MED ORDER — ONDANSETRON HCL 8 MG PO TABS
8.0000 mg | ORAL_TABLET | Freq: Once | ORAL | Status: DC
  Filled 2022-07-11: qty 1

## 2022-07-11 NOTE — Patient Instructions (Signed)
[facility]  Discharge Instructions: Thank you for choosing [facility] to provide your oncology and hematology care.   If you have a lab appointment with the Cancer Center, please go directly to the Cancer Center and check in at the registration area.   Wear comfortable clothing and clothing appropriate for easy access to any Portacath or PICC line.   We strive to give you quality time with your provider. You may need to reschedule your appointment if you arrive late (15 or more minutes).  Arriving late affects you and other patients whose appointments are after yours.  Also, if you miss three or more appointments without notifying the office, you may be dismissed from the clinic at the provider's discretion.      For prescription refill requests, have your pharmacy contact our office and allow 72 hours for refills to be completed.    Today you received the following chemotherapy and/or immunotherapy agents : Velcade     To help prevent nausea and vomiting after your treatment, we encourage you to take your nausea medication as directed.  BELOW ARE SYMPTOMS THAT SHOULD BE REPORTED IMMEDIATELY: *FEVER GREATER THAN 100.4 F (38 C) OR HIGHER *CHILLS OR SWEATING *NAUSEA AND VOMITING THAT IS NOT CONTROLLED WITH YOUR NAUSEA MEDICATION *UNUSUAL SHORTNESS OF BREATH *UNUSUAL BRUISING OR BLEEDING *URINARY PROBLEMS (pain or burning when urinating, or frequent urination) *BOWEL PROBLEMS (unusual diarrhea, constipation, pain near the anus) TENDERNESS IN MOUTH AND THROAT WITH OR WITHOUT PRESENCE OF ULCERS (sore throat, sores in mouth, or a toothache) UNUSUAL RASH, SWELLING OR PAIN  UNUSUAL VAGINAL DISCHARGE OR ITCHING   Items with * indicate a potential emergency and should be followed up as soon as possible or go to the Emergency Department if any problems should occur.  Please show the CHEMOTHERAPY ALERT CARD or IMMUNOTHERAPY ALERT CARD at check-in to  the Emergency Department and triage nurse.  Should you have questions after your visit or need to cancel or reschedule your appointment, please contact [facility]  Dept: [phone]  and follow the prompts.  Office hours are 8:00 a.m. to 4:30 p.m. Monday - Friday. Please note that voicemails left after 4:00 p.m. may not be returned until the following business day.  We are closed weekends and major holidays. You have access to a nurse at all times for urgent questions. Please call the main number to the clinic Dept: [phone] and follow the prompts.   For any non-urgent questions, you may also contact your provider using MyChart. We now offer e-Visits for anyone 18 and older to request care online for non-urgent symptoms. For details visit [email or web address].   Also download the MyChart app! Go to the app store, search "MyChart", open the app, select [facility], and log in with your MyChart username and password.  Masks are optional in the cancer centers. If you would like for your care team to wear a mask while they are taking care of you, please let them know. You may have one support person who is at least 76 years old accompany you for your appointments. 

## 2022-07-11 NOTE — Progress Notes (Signed)
Per Dr. Irene Limbo ok to run zometa infusion over 30 minutes instead of 15 minutes for cramping and discomfort.

## 2022-07-14 ENCOUNTER — Other Ambulatory Visit: Payer: Self-pay | Admitting: Hematology

## 2022-07-14 DIAGNOSIS — Z7189 Other specified counseling: Secondary | ICD-10-CM

## 2022-07-14 DIAGNOSIS — C9000 Multiple myeloma not having achieved remission: Secondary | ICD-10-CM

## 2022-07-17 ENCOUNTER — Other Ambulatory Visit: Payer: Self-pay

## 2022-07-17 DIAGNOSIS — C9000 Multiple myeloma not having achieved remission: Secondary | ICD-10-CM

## 2022-07-18 ENCOUNTER — Inpatient Hospital Stay (HOSPITAL_BASED_OUTPATIENT_CLINIC_OR_DEPARTMENT_OTHER): Payer: Medicare Other | Admitting: Hematology

## 2022-07-18 ENCOUNTER — Other Ambulatory Visit: Payer: Self-pay

## 2022-07-18 ENCOUNTER — Other Ambulatory Visit: Payer: Medicare Other

## 2022-07-18 ENCOUNTER — Inpatient Hospital Stay: Payer: Medicare Other

## 2022-07-18 ENCOUNTER — Other Ambulatory Visit: Payer: Self-pay | Admitting: Hematology

## 2022-07-18 ENCOUNTER — Ambulatory Visit: Payer: Medicare Other

## 2022-07-18 VITALS — BP 151/82 | HR 53 | Temp 97.5°F | Resp 15 | Wt 212.2 lb

## 2022-07-18 DIAGNOSIS — C9000 Multiple myeloma not having achieved remission: Secondary | ICD-10-CM

## 2022-07-18 DIAGNOSIS — Z5111 Encounter for antineoplastic chemotherapy: Secondary | ICD-10-CM

## 2022-07-18 DIAGNOSIS — Z7189 Other specified counseling: Secondary | ICD-10-CM

## 2022-07-18 DIAGNOSIS — Z5112 Encounter for antineoplastic immunotherapy: Secondary | ICD-10-CM | POA: Diagnosis not present

## 2022-07-18 LAB — CMP (CANCER CENTER ONLY)
ALT: 29 U/L (ref 0–44)
AST: 20 U/L (ref 15–41)
Albumin: 3.7 g/dL (ref 3.5–5.0)
Alkaline Phosphatase: 36 U/L — ABNORMAL LOW (ref 38–126)
Anion gap: 2 — ABNORMAL LOW (ref 5–15)
BUN: 14 mg/dL (ref 8–23)
CO2: 29 mmol/L (ref 22–32)
Calcium: 9.8 mg/dL (ref 8.9–10.3)
Chloride: 107 mmol/L (ref 98–111)
Creatinine: 0.92 mg/dL (ref 0.61–1.24)
GFR, Estimated: >60 mL/min (ref >60)
Glucose, Bld: 92 mg/dL (ref 70–99)
Potassium: 4.4 mmol/L (ref 3.5–5.1)
Sodium: 138 mmol/L (ref 135–145)
Total Bilirubin: 0.9 mg/dL (ref 0.3–1.2)
Total Protein: 5.8 g/dL — ABNORMAL LOW (ref 6.5–8.1)

## 2022-07-18 LAB — CBC WITH DIFFERENTIAL (CANCER CENTER ONLY)
Abs Immature Granulocytes: 0.02 10*3/uL (ref 0.00–0.07)
Basophils Absolute: 0 10*3/uL (ref 0.0–0.1)
Basophils Relative: 1 %
Eosinophils Absolute: 0.2 10*3/uL (ref 0.0–0.5)
Eosinophils Relative: 4 %
HCT: 36.6 % — ABNORMAL LOW (ref 39.0–52.0)
Hemoglobin: 12.2 g/dL — ABNORMAL LOW (ref 13.0–17.0)
Immature Granulocytes: 0 %
Lymphocytes Relative: 12 %
Lymphs Abs: 0.5 10*3/uL — ABNORMAL LOW (ref 0.7–4.0)
MCH: 27.4 pg (ref 26.0–34.0)
MCHC: 33.3 g/dL (ref 30.0–36.0)
MCV: 82.1 fL (ref 80.0–100.0)
Monocytes Absolute: 0.9 10*3/uL (ref 0.1–1.0)
Monocytes Relative: 20 %
Neutro Abs: 2.9 10*3/uL (ref 1.7–7.7)
Neutrophils Relative %: 63 %
Platelet Count: 119 10*3/uL — ABNORMAL LOW (ref 150–400)
RBC: 4.46 MIL/uL (ref 4.22–5.81)
RDW: 17.6 % — ABNORMAL HIGH (ref 11.5–15.5)
WBC Count: 4.6 10*3/uL (ref 4.0–10.5)
nRBC: 0 % (ref 0.0–0.2)

## 2022-07-18 MED ORDER — BORTEZOMIB CHEMO SQ INJECTION 3.5 MG (2.5MG/ML)
1.5000 mg/m2 | Freq: Once | INTRAMUSCULAR | Status: AC
  Administered 2022-07-18: 3.5 mg via SUBCUTANEOUS
  Filled 2022-07-18: qty 1.4

## 2022-07-18 MED ORDER — DEXAMETHASONE 4 MG PO TABS: 20.0000 mg | ORAL_TABLET | Freq: Once | ORAL | Status: DC

## 2022-07-18 MED ORDER — ONDANSETRON HCL 8 MG PO TABS
8.0000 mg | ORAL_TABLET | Freq: Once | ORAL | Status: DC
  Filled 2022-07-18: qty 1

## 2022-07-18 MED ORDER — DOXYCYCLINE HYCLATE 100 MG PO TABS: 100.0000 mg | ORAL_TABLET | Freq: Two times a day (BID) | ORAL | 0 refills | Status: DC

## 2022-07-18 NOTE — Progress Notes (Signed)
Patient took zofran and decadron at home around 120pm

## 2022-07-18 NOTE — Patient Instructions (Signed)
[facility]  Discharge Instructions: Thank you for choosing [facility] to provide your oncology and hematology care.   If you have a lab appointment with the Cancer Center, please go directly to the Cancer Center and check in at the registration area.   Wear comfortable clothing and clothing appropriate for easy access to any Portacath or PICC line.   We strive to give you quality time with your provider. You may need to reschedule your appointment if you arrive late (15 or more minutes).  Arriving late affects you and other patients whose appointments are after yours.  Also, if you miss three or more appointments without notifying the office, you may be dismissed from the clinic at the provider's discretion.      For prescription refill requests, have your pharmacy contact our office and allow 72 hours for refills to be completed.    Today you received the following chemotherapy and/or immunotherapy agents : Velcade     To help prevent nausea and vomiting after your treatment, we encourage you to take your nausea medication as directed.  BELOW ARE SYMPTOMS THAT SHOULD BE REPORTED IMMEDIATELY: *FEVER GREATER THAN 100.4 F (38 C) OR HIGHER *CHILLS OR SWEATING *NAUSEA AND VOMITING THAT IS NOT CONTROLLED WITH YOUR NAUSEA MEDICATION *UNUSUAL SHORTNESS OF BREATH *UNUSUAL BRUISING OR BLEEDING *URINARY PROBLEMS (pain or burning when urinating, or frequent urination) *BOWEL PROBLEMS (unusual diarrhea, constipation, pain near the anus) TENDERNESS IN MOUTH AND THROAT WITH OR WITHOUT PRESENCE OF ULCERS (sore throat, sores in mouth, or a toothache) UNUSUAL RASH, SWELLING OR PAIN  UNUSUAL VAGINAL DISCHARGE OR ITCHING   Items with * indicate a potential emergency and should be followed up as soon as possible or go to the Emergency Department if any problems should occur.  Please show the CHEMOTHERAPY ALERT CARD or IMMUNOTHERAPY ALERT CARD at check-in to  the Emergency Department and triage nurse.  Should you have questions after your visit or need to cancel or reschedule your appointment, please contact [facility]  Dept: [phone]  and follow the prompts.  Office hours are 8:00 a.m. to 4:30 p.m. Monday - Friday. Please note that voicemails left after 4:00 p.m. may not be returned until the following business day.  We are closed weekends and major holidays. You have access to a nurse at all times for urgent questions. Please call the main number to the clinic Dept: [phone] and follow the prompts.   For any non-urgent questions, you may also contact your provider using MyChart. We now offer e-Visits for anyone 18 and older to request care online for non-urgent symptoms. For details visit [email or web address].   Also download the MyChart app! Go to the app store, search "MyChart", open the app, select , and log in with your MyChart username and password.  Masks are optional in the cancer centers. If you would like for your care team to wear a mask while they are taking care of you, please let them know. You may have one support person who is at least 76 years old accompany you for your appointments. 

## 2022-07-20 NOTE — Progress Notes (Signed)
[name]   HEMATOLOGY/ONCOLOGY CLINIC NOTE   Date of Service: 07/18/2022  Patient Care Team: [name], MD as PCP - General (Family Medicine) [name], MD as Consulting Physician (Hematology)  CHIEF COMPLAINTS/PURPOSE OF CONSULTATION:  Follow-up for continued evaluation and management of multiple myeloma  HISTORY OF PRESENTING ILLNESS:  Please see previous note for details of initial presentation  INTERVAL HISTORY Nathan Martinez is a 76 y.o. male here for continued evaluation and management of multiple myeloma and follow-up prior to C6D15 of RVD. He reports He is doing well with no new symptoms or concerns.  He is tolerating the current dose Revlimid at 25 mg p.o daily with no prohibitive toxicities at this time. He notes no significant signs of neuropathy from his current Velcade.  He has had his transplant evaluation with Dr. Quentin Ore at [facility] on 07/15/2022.  No fevers no chills no night sweats.  No significant new fatigue. No loose stools or change in bowel habits. No other new or acute focal symptoms.  No other notable toxicities from his current treatment.    Labs done today were reviewed in detail with the patient. Labs done at [facility] from 07/15/2022 were reviewed with the patient no M spike noted on protein electrophoresis or IFE.  Kappa lambda free light chains within normal limits.  We had a detailed discussion regarding his subsequent treatment options.  He feels he has had a good understanding of what is involved with the bone marrow transplant process.  He notes he does not want to proceed at this time after understanding all the pros and cons.  He feels that his current life is he does not want to take the risk of her transplant and also because it will be quite disruptive to his life at this time.  He prefers to proceed with maintenance treatment.   MEDICAL HISTORY:  Intermittent hypercalcemia - primary  hyperparathyroidism status post parathyroidectomy in 2019 HTN HLD Glaucoma History of Helicobacter pylori infection previously treated Chronic leukopenia/neutropenia History of present iron deficiency anemia  SURGICAL HISTORY: Rt TKA 2018 Parathyroidectomy 2019 B/l cataracts sx 2021  SOCIAL HISTORY: Social History   Socioeconomic History   Marital status: Married    Spouse name: Not on file   Number of children: Not on file   Years of education: Not on file   Highest education level: Not on file  Occupational History   Not on file  Tobacco Use   Smoking status: Never   Smokeless tobacco: Never  Vaping Use   Vaping Use: Never used  Substance and Sexual Activity   Alcohol use: Not Currently   Drug use: Never   Sexual activity: Not Currently    Birth control/protection: None  Other Topics Concern   Not on file  Social History Narrative   Not on file   Social Determinants of Health   Financial Resource Strain: Not on file  Food Insecurity: Not on file  Transportation Needs: Not on file  Physical Activity: Not on file  Stress: Not on file  Social Connections: Not on file  Intimate Partner Violence: Not on file  Ex smoker (quit 1976) 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  MEDICATIONS:  Krill oil Olmesartan hydrochlorothiazide 20-12.5 mg Ferrex 150 mg p.o. daily Aspirin 81 mg p.o. daily Atorvastatin  20 mg p.o. daily Multivitamin 1 tablet p.o. daily Ergocalciferol 50,000 units once weekly   REVIEW OF SYSTEMS:   10 Point review of Systems was done is negative except as noted above.  PHYSICAL EXAMINATION: .BP (!) 151/82 (BP Location: Left Arm, Patient Position: Sitting) Comment: Nurse was notify  Pulse (!) 53 Comment: Nurse was notify  Temp (!) 97.5 F (36.4 C) (Temporal)   Resp 15   Wt 212 lb  3.2 oz (96.3 kg)   SpO2 99%   BMI 28.00 kg/m  NAD GENERAL:alert, in no acute distress and comfortable SKIN: no acute rashes, no significant lesions EYES: conjunctiva are pink and non-injected, sclera anicteric NECK: supple, no JVD LYMPH:  no palpable lymphadenopathy in the cervical, axillary or inguinal regions LUNGS: clear to auscultation b/l with normal respiratory effort HEART: regular rate & rhythm ABDOMEN:  normoactive bowel sounds , non tender, not distended. Extremity: no pedal edema PSYCH: alert & oriented x 3 with fluent speech NEURO: no focal motor/sensory deficits  LABORATORY DATA:  I have reviewed the data as listed  .    Latest Ref Rng & Units 07/18/2022    1:38 PM 07/11/2022    1:40 PM 07/04/2022    1:33 PM  CBC  WBC 4.0 - 10.5 K/uL 4.6  3.4  3.8   Hemoglobin 13.0 - 17.0 g/dL 12.2  12.2  12.2   Hematocrit 39.0 - 52.0 % 36.6  36.6  36.1   Platelets 150 - 400 K/uL 119  115  144    CBC    Component Value Date/Time   WBC 4.6 07/18/2022 1338   WBC 3.9 (L) 06/06/2022 1250   RBC 4.46 07/18/2022 1338   HGB 12.2 (L) 07/18/2022 1338   HCT 36.6 (L) 07/18/2022 1338   PLT 119 (L) 07/18/2022 1338   MCV 82.1 07/18/2022 1338   MCH 27.4 07/18/2022 1338   MCHC 33.3 07/18/2022 1338   RDW 17.6 (H) 07/18/2022 1338   LYMPHSABS 0.5 (L) 07/18/2022 1338   MONOABS 0.9 07/18/2022 1338   EOSABS 0.2 07/18/2022 1338   BASOSABS 0.0 07/18/2022 1338    .    Latest Ref Rng & Units 07/18/2022    1:38 PM 07/11/2022    1:40 PM 07/04/2022    1:33 PM  CMP  Glucose 70 - 99 mg/dL 92  90  124   BUN 8 - 23 mg/dL 14  16  14    Creatinine 0.61 - 1.24 mg/dL 0.92  1.08  1.04   Sodium 135 - 145 mmol/L 138  137  137   Potassium 3.5 - 5.1 mmol/L 4.4  4.1  4.0   Chloride 98 - 111 mmol/L 107  106  107   CO2 22 - 32 mmol/L 29  27  26    Calcium 8.9 - 10.3 mg/dL 9.8  9.6  9.4   Total Protein 6.5 - 8.1 g/dL 5.8  5.7  5.8   Total Bilirubin 0.3 - 1.2 mg/dL 0.9  1.0  0.7   Alkaline Phos 38 - 126 U/L  36  32  36   AST 15 - 41 U/L 20  23  23    ALT 0 - 44 U/L 29  25  25       IEOP/Immunofixation Order: [id number] Component 9 d ago  PATHOLOGIST CHARGE ID  [id number] - Jamison Neighbor, M.D.   SERUM IEOP COMMENT   NO DEFINITE CLONALITY DETECTED.   Reviewed and Interpreted By   Jamison Neighbor, M.D.  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  PLAN -Labs done today were reviewed in detail with the patient as noted above CBC stable wih Hgb of 12.2, WBC count of 4.6k, and platelets of 119k. CMP unremarkable. MM panel done 8/22 with no M spike and neg IFE. -Patient notes no notable toxicities from his current RVD regimen. Orders reviewed and placed. -conitnue same supportive medications. -Appropriate to proceed with next cycle 6D15 of RVD. -Continue Revlimid to 25 mg 3 weeks on 1 week off. -Continue monthly Zometa -Continue vitamin D at least 2000 units daily. -We discussed trying Marinol for a month to see if appetite improves. After extensive discussions with Nathan Martinez transplant team and Dr Aris Lot the patient chooses to hold off on Auto HSCT and preferrs  4) leg swelling due to steroids -patient using compression socks -Continue low dose Lasix. He can reduce taking Lasix to 3 days and if leg swelling continues to be well managed then he can stop it completely and only take as needed. Patient declines transplant consideration . Will start him on  maintenance Revlimid after rpt BM Bx and PET/CY #3 Stye -warm water soaks Eythromycin ointment -continue f/u with ophthamoloy  Follow-up Change to zometa maintenance Every 3 months PET/CT in 2 weeks BM Bx in 2 weeks RTC with dr Irene Limbo in 4 weeks  The total time spent in the appointment was 32 minutes*.  All of the patient's questions were answered with apparent satisfaction. The patient knows to call the clinic with any problems, questions or concerns.   Sullivan Lone MD MS AAHIVMS [facility] Hematology/Oncology Physician [facility]  .*Total Encounter Time as defined by the Centers for Medicare and Medicaid Services includes, in addition to the face-to-face time of a patient visit (documented in the note above) non-face-to-face time: obtaining and reviewing outside history, ordering and reviewing medications, tests or procedures, care coordination (communications with other health care professionals or caregivers) and documentation in the medical record.  I, Melene Muller, am acting as scribe for Dr. Sullivan Lone, MD. .I have reviewed the above documentation for accuracy and completeness, and I agree with the above. Judeth Porch Uri Turnbough,acting as a scribe for Sullivan Lone, MD.,have documented all relevant documentation on the behalf of Sullivan Lone, MD,as directed by  Sullivan Lone, MD while in the presence of Sullivan Lone, MD.

## 2022-07-21 ENCOUNTER — Telehealth: Payer: Self-pay | Admitting: Hematology

## 2022-07-21 NOTE — Telephone Encounter (Signed)
Scheduled follow-up appointments per 8/25 los. Patient is aware.

## 2022-07-24 ENCOUNTER — Encounter: Payer: Self-pay | Admitting: Hematology

## 2022-07-24 ENCOUNTER — Other Ambulatory Visit: Payer: Self-pay | Admitting: *Deleted

## 2022-07-24 DIAGNOSIS — C9000 Multiple myeloma not having achieved remission: Secondary | ICD-10-CM

## 2022-07-25 ENCOUNTER — Inpatient Hospital Stay: Payer: Medicare Other | Attending: Hematology

## 2022-07-25 ENCOUNTER — Other Ambulatory Visit: Payer: Medicare Other

## 2022-07-25 ENCOUNTER — Inpatient Hospital Stay: Payer: Medicare Other

## 2022-07-25 ENCOUNTER — Ambulatory Visit: Payer: Medicare Other

## 2022-07-25 VITALS — BP 156/85 | HR 70 | Temp 98.0°F | Resp 17 | Wt 214.0 lb

## 2022-07-25 DIAGNOSIS — C9000 Multiple myeloma not having achieved remission: Secondary | ICD-10-CM

## 2022-07-25 DIAGNOSIS — Z5112 Encounter for antineoplastic immunotherapy: Secondary | ICD-10-CM | POA: Diagnosis present

## 2022-07-25 DIAGNOSIS — Z7189 Other specified counseling: Secondary | ICD-10-CM

## 2022-07-25 LAB — CBC WITH DIFFERENTIAL (CANCER CENTER ONLY)
Abs Immature Granulocytes: 0.02 10*3/uL (ref 0.00–0.07)
Basophils Absolute: 0 10*3/uL (ref 0.0–0.1)
Basophils Relative: 0 %
Eosinophils Absolute: 0.1 10*3/uL (ref 0.0–0.5)
Eosinophils Relative: 3 %
HCT: 36.1 % — ABNORMAL LOW (ref 39.0–52.0)
Hemoglobin: 12.2 g/dL — ABNORMAL LOW (ref 13.0–17.0)
Immature Granulocytes: 0 %
Lymphocytes Relative: 13 %
Lymphs Abs: 0.6 10*3/uL — ABNORMAL LOW (ref 0.7–4.0)
MCH: 27.9 pg (ref 26.0–34.0)
MCHC: 33.8 g/dL (ref 30.0–36.0)
MCV: 82.4 fL (ref 80.0–100.0)
Monocytes Absolute: 0.6 10*3/uL (ref 0.1–1.0)
Monocytes Relative: 13 %
Neutro Abs: 3.2 10*3/uL (ref 1.7–7.7)
Neutrophils Relative %: 71 %
Platelet Count: 144 10*3/uL — ABNORMAL LOW (ref 150–400)
RBC: 4.38 MIL/uL (ref 4.22–5.81)
RDW: 18.6 % — ABNORMAL HIGH (ref 11.5–15.5)
WBC Count: 4.5 10*3/uL (ref 4.0–10.5)
nRBC: 0 % (ref 0.0–0.2)

## 2022-07-25 LAB — CMP (CANCER CENTER ONLY)
ALT: 27 U/L (ref 0–44)
AST: 21 U/L (ref 15–41)
Albumin: 3.7 g/dL (ref 3.5–5.0)
Alkaline Phosphatase: 35 U/L — ABNORMAL LOW (ref 38–126)
Anion gap: 3 — ABNORMAL LOW (ref 5–15)
BUN: 15 mg/dL (ref 8–23)
CO2: 29 mmol/L (ref 22–32)
Calcium: 9.7 mg/dL (ref 8.9–10.3)
Chloride: 109 mmol/L (ref 98–111)
Creatinine: 1.07 mg/dL (ref 0.61–1.24)
GFR, Estimated: >60 mL/min (ref >60)
Glucose, Bld: 99 mg/dL (ref 70–99)
Potassium: 3.9 mmol/L (ref 3.5–5.1)
Sodium: 141 mmol/L (ref 135–145)
Total Bilirubin: 0.8 mg/dL (ref 0.3–1.2)
Total Protein: 5.7 g/dL — ABNORMAL LOW (ref 6.5–8.1)

## 2022-07-25 MED ORDER — BORTEZOMIB CHEMO SQ INJECTION 3.5 MG (2.5MG/ML)
1.5000 mg/m2 | Freq: Once | INTRAMUSCULAR | Status: AC
  Administered 2022-07-25: 3.5 mg via SUBCUTANEOUS
  Filled 2022-07-25: qty 1.4

## 2022-07-25 MED ORDER — ONDANSETRON HCL 8 MG PO TABS: 8.0000 mg | ORAL_TABLET | Freq: Once | ORAL | Status: DC

## 2022-07-25 MED ORDER — DEXAMETHASONE 4 MG PO TABS: 20.0000 mg | ORAL_TABLET | Freq: Once | ORAL | Status: DC

## 2022-07-25 NOTE — Patient Instructions (Signed)
[facility] ONCOLOGY  Discharge Instructions: Thank you for choosing [facility] to provide your oncology and hematology care.   If you have a lab appointment with the [facility], please go directly to the [facility] and check in at the registration area.   Wear comfortable clothing and clothing appropriate for easy access to any Portacath or PICC line.   We strive to give you quality time with your provider. You may need to reschedule your appointment if you arrive late (15 or more minutes).  Arriving late affects you and other patients whose appointments are after yours.  Also, if you miss three or more appointments without notifying the office, you may be dismissed from the clinic at the provider's discretion.      For prescription refill requests, have your pharmacy contact our office and allow 72 hours for refills to be completed.    Today you received the following chemotherapy and/or immunotherapy agent: Velcade      To help prevent nausea and vomiting after your treatment, we encourage you to take your nausea medication as directed.  BELOW ARE SYMPTOMS THAT SHOULD BE REPORTED IMMEDIATELY: *FEVER GREATER THAN 100.4 F (38 C) OR HIGHER *CHILLS OR SWEATING *NAUSEA AND VOMITING THAT IS NOT CONTROLLED WITH YOUR NAUSEA MEDICATION *UNUSUAL SHORTNESS OF BREATH *UNUSUAL BRUISING OR BLEEDING *URINARY PROBLEMS (pain or burning when urinating, or frequent urination) *BOWEL PROBLEMS (unusual diarrhea, constipation, pain near the anus) TENDERNESS IN MOUTH AND THROAT WITH OR WITHOUT PRESENCE OF ULCERS (sore throat, sores in mouth, or a toothache) UNUSUAL RASH, SWELLING OR PAIN  UNUSUAL VAGINAL DISCHARGE OR ITCHING   Items with * indicate a potential emergency and should be followed up as soon as possible or go to the Emergency Department if any problems should occur.  Please show the CHEMOTHERAPY ALERT CARD or IMMUNOTHERAPY ALERT CARD at check-in to the  Emergency Department and triage nurse.  Should you have questions after your visit or need to cancel or reschedule your appointment, please contact [facility]  Dept: [phone]  and follow the prompts.  Office hours are 8:00 a.m. to 4:30 p.m. Monday - Friday. Please note that voicemails left after 4:00 p.m. may not be returned until the following business day.  We are closed weekends and major holidays. You have access to a nurse at all times for urgent questions. Please call the main number to the clinic Dept: [phone] and follow the prompts.   For any non-urgent questions, you may also contact your provider using MyChart. We now offer e-Visits for anyone 13 and older to request care online for non-urgent symptoms. For details visit [email or web address].   Also download the MyChart app! Go to the app store, search "MyChart", open the app, select [facility], and log in with your MyChart username and password.  Masks are optional in the cancer centers. If you would like for your care team to wear a mask while they are taking care of you, please let them know. You may have one support person who is at least 76 years old accompany you for your appointments.

## 2022-07-30 NOTE — Telephone Encounter (Signed)
Have this been resolved? If so can someone close this for me. Thanks

## 2022-08-08 ENCOUNTER — Other Ambulatory Visit: Payer: Medicare Other

## 2022-08-08 ENCOUNTER — Inpatient Hospital Stay: Payer: Medicare Other

## 2022-08-08 ENCOUNTER — Other Ambulatory Visit: Payer: Self-pay | Admitting: Internal Medicine

## 2022-08-08 DIAGNOSIS — C9000 Multiple myeloma not having achieved remission: Secondary | ICD-10-CM

## 2022-08-10 ENCOUNTER — Other Ambulatory Visit: Payer: Self-pay | Admitting: Radiology

## 2022-08-10 NOTE — H&P (Signed)
Referring Physician(s): [name]  Supervising Physician: Jacqulynn Cadet  Patient Status:  Nathan Martinez OP  Chief Complaint:  "I'm here for a bone marrow biopsy"  Subjective: Pt known to IR service from bone marrow biopsy on 01/28/22. He has a hx of multiple myeloma and presents again today for f/u CT guided bone marrow biopsy to assess treatment response. Additional med hx as below. He currently denies fever,HA,CP,dyspnea, cough, abd/back pain,N/V or bleeding.    Past Medical History:  Diagnosis Date   Hypertension    Past Surgical History:  Procedure Laterality Date   KNEE SURGERY Right 2018   PARATHYROID EXPLORATION        Allergies: Patient has no known allergies.  Medications: Prior to Admission medications   Medication Sig Start Date End Date Taking? Authorizing Provider  acyclovir (ZOVIRAX) 400 MG tablet Take 1 tablet by mouth twice daily 07/14/22   [name], MD  aspirin 81 MG chewable tablet Chew by mouth. 09/30/16   [provider]  atorvastatin (LIPITOR) 20 MG tablet Take 1 tablet (20 mg total) by mouth at bedtime. 05/09/22   [name], MD  dexamethasone (DECADRON) 4 MG tablet TAKE 5 TABLETS BY MOUTH ONCE A WEEK PRIOR TO TREATMENT 07/14/22   [name], MD  doxycycline (VIBRA-TABS) 100 MG tablet Take 1 tablet (100 mg total) by mouth 2 (two) times daily. For infected Stye in patient with myeloma 07/18/22   [name], MD  dronabinol (MARINOL) 2.5 MG capsule Take 1 capsule (2.5 mg total) by mouth 2 (two) times daily before a meal. 05/23/22   [name], MD  ergocalciferol (VITAMIN D2) 1.25 MG (50000 UT) capsule Take 1 capsule (50,000 Units total) by mouth once a week. 06/13/22   [name], MD  erythromycin ophthalmic ointment Place 1 Application into both eyes in the morning and at bedtime. 05/23/22   [name], MD  furosemide (LASIX) 20 MG tablet Take 1 tablet (20 mg total) by mouth daily.  05/11/22   [name], MD  iron polysaccharides (NIFEREX) 150 MG capsule Take 1 capsule (150 mg total) by mouth daily. 01/23/22   [name], MD  Javier Docker Oil (OMEGA-3) 500 MG CAPS  09/30/16   [provider]  latanoprost (XALATAN) 0.005 % ophthalmic solution SMARTSIG:1 Drop(s) In Eye(s) Every Evening 01/28/22   [provider]  loratadine (CLARITIN) 10 MG tablet Take 10 mg by mouth daily.    [provider]  LORazepam (ATIVAN) 0.5 MG tablet Take 1 tablet (0.5 mg total) by mouth every 6 (six) hours as needed (Nausea or vomiting). 02/14/22   [name], MD  Magnesium Oxide 400 MG CAPS     [provider]  Multiple Vitamins-Minerals (MULTIVITAMIN MEN 50+) TABS Take by mouth.    [provider]  olmesartan (BENICAR) 20 MG tablet Take 1 tablet (20 mg total) by mouth daily. 05/09/22   [name], MD  Olopatadine HCl 0.2 % SOLN Apply 1 drop to eye daily. 05/23/22   [name], MD  ondansetron (ZOFRAN) 8 MG tablet Take 1 tablet (8 mg total) by mouth 2 (two) times daily as needed (Nausea or vomiting). 02/14/22   [name], MD  prochlorperazine (COMPAZINE) 10 MG tablet Take 1 tablet (10 mg total) by mouth every 6 (six) hours as needed (Nausea or vomiting). 02/14/22   [name], MD  REVLIMID 25 MG capsule TAKE 1 CAPSULE BY MOUTH ONCE DAILY  FOR 21 DAYS ON AND 7 DAYS OFF 07/18/22   [name], MD  tadalafil (CIALIS) 5 MG tablet  02/01/15   [provider]     Vital Signs: Vitals:   08/11/22 0701 08/11/22 0718  BP: (!) 173/101 (!) 150/93  Pulse: 68   Resp: 18   Temp: 98.1 F (36.7 C)   SpO2: 97%       Physical Exam awake, alert.  Chest clear to auscultation bilaterally.  Heart with regular rate and rhythm.  Abdomen soft, positive bowel sounds, nontender.  Trace pretibial edema bilat,R>L  Imaging: No results found.  Labs:  CBC: Recent Labs    07/04/22 1333 07/11/22 1340  07/18/22 1338 07/25/22 1424  WBC 3.8* 3.4* 4.6 4.5  HGB 12.2* 12.2* 12.2* 12.2*  HCT 36.1* 36.6* 36.6* 36.1*  PLT 144* 115* 119* 144*    COAGS: No results for input(s): "INR", "APTT" in the last 8760 hours.  BMP: Recent Labs    07/04/22 1333 07/11/22 1340 07/18/22 1338 07/25/22 1424  NA 137 137 138 141  K 4.0 4.1 4.4 3.9  CL 107 106 107 109  CO2 26 27 29 29   GLUCOSE 124* 90 92 99  BUN 14 16 14 15   CALCIUM 9.4 9.6 9.8 9.7  CREATININE 1.04 1.08 0.92 1.07  GFRNONAA >60 >60 >60 >60    LIVER FUNCTION TESTS: Recent Labs    07/04/22 1333 07/11/22 1340 07/18/22 1338 07/25/22 1424  BILITOT 0.7 1.0 0.9 0.8  AST 23 23 20 21   ALT 25 25 29 27   ALKPHOS 36* 32* 36* 35*  PROT 5.8* 5.7* 5.8* 5.7*  ALBUMIN 3.7 3.6 3.7 3.7    Assessment and Plan: Pt known to IR service from bone marrow biopsy on 01/28/22. He has a hx of multiple myeloma and presents again today for f/u CT guided bone marrow biopsy to assess treatment response.Risks and benefits of procedure was discussed with the patient   including, but not limited to bleeding, infection, damage to adjacent structures or low yield requiring additional tests.  All of the questions were answered and there is agreement to proceed.  Consent signed and in chart.    Electronically Signed: D. Rowe Robert, PA-C 08/10/2022, 5:30 PM   I spent a total of 15 Minutes at the the patient's bedside AND on the patient's hospital floor or unit, greater than 50% of which was counseling/coordinating care for  CT guided bone marrow biopsy

## 2022-08-11 ENCOUNTER — Ambulatory Visit (HOSPITAL_COMMUNITY)
Admission: RE | Admit: 2022-08-11 | Discharge: 2022-08-11 | Disposition: A | Discharge status: Home / Self Care | Payer: Medicare Other | Source: Ambulatory Visit | Attending: Hematology | Admitting: Hematology

## 2022-08-11 ENCOUNTER — Other Ambulatory Visit: Payer: Self-pay

## 2022-08-11 ENCOUNTER — Encounter (HOSPITAL_COMMUNITY): Payer: Self-pay

## 2022-08-11 DIAGNOSIS — C9000 Multiple myeloma not having achieved remission: Secondary | ICD-10-CM | POA: Diagnosis present

## 2022-08-11 DIAGNOSIS — Z1379 Encounter for other screening for genetic and chromosomal anomalies: Secondary | ICD-10-CM | POA: Diagnosis not present

## 2022-08-11 DIAGNOSIS — D696 Thrombocytopenia, unspecified: Secondary | ICD-10-CM | POA: Diagnosis not present

## 2022-08-11 DIAGNOSIS — Z5111 Encounter for antineoplastic chemotherapy: Secondary | ICD-10-CM

## 2022-08-11 LAB — CBC WITH DIFFERENTIAL/PLATELET
Abs Immature Granulocytes: 0.02 10*3/uL (ref 0.00–0.07)
Basophils Absolute: 0 10*3/uL (ref 0.0–0.1)
Basophils Relative: 1 %
Eosinophils Absolute: 0.1 10*3/uL (ref 0.0–0.5)
Eosinophils Relative: 2 %
HCT: 40 % (ref 39.0–52.0)
Hemoglobin: 13 g/dL (ref 13.0–17.0)
Immature Granulocytes: 0 %
Lymphocytes Relative: 11 %
Lymphs Abs: 0.6 10*3/uL — ABNORMAL LOW (ref 0.7–4.0)
MCH: 27.7 pg (ref 26.0–34.0)
MCHC: 32.5 g/dL (ref 30.0–36.0)
MCV: 85.1 fL (ref 80.0–100.0)
Monocytes Absolute: 0.6 10*3/uL (ref 0.1–1.0)
Monocytes Relative: 13 %
Neutro Abs: 3.7 10*3/uL (ref 1.7–7.7)
Neutrophils Relative %: 73 %
Platelets: 143 10*3/uL — ABNORMAL LOW (ref 150–400)
RBC: 4.7 MIL/uL (ref 4.22–5.81)
RDW: 18.2 % — ABNORMAL HIGH (ref 11.5–15.5)
WBC: 5.1 10*3/uL (ref 4.0–10.5)
nRBC: 0 % (ref 0.0–0.2)

## 2022-08-11 MED ORDER — FENTANYL CITRATE (PF) 100 MCG/2ML IJ SOLN
INTRAMUSCULAR | Status: AC
  Filled 2022-08-11: qty 2

## 2022-08-11 MED ORDER — MIDAZOLAM HCL 2 MG/2ML IJ SOLN
INTRAMUSCULAR | Status: AC
  Filled 2022-08-11: qty 4

## 2022-08-11 MED ORDER — FENTANYL CITRATE (PF) 100 MCG/2ML IJ SOLN
INTRAMUSCULAR | Status: AC | PRN
  Administered 2022-08-11: 50 ug via INTRAVENOUS

## 2022-08-11 MED ORDER — MIDAZOLAM HCL 2 MG/2ML IJ SOLN
INTRAMUSCULAR | Status: AC | PRN
  Administered 2022-08-11: 1 mg via INTRAVENOUS

## 2022-08-11 MED ORDER — SODIUM CHLORIDE 0.9 % IV SOLN: INTRAVENOUS | Status: DC

## 2022-08-11 NOTE — Discharge Instructions (Signed)
Discharge Instructions:   Please call Interventional Radiology clinic [phone] with any questions or concerns.  You may remove your dressing and shower tomorrow. Moderate Conscious Sedation, Adult, Care After This sheet gives you information about how to care for yourself after your procedure. Your health care provider may also give you more specific instructions. If you have problems or questions, contact your health care provider. What can I expect after the procedure? After the procedure, it is common to have: Sleepiness for several hours. Impaired judgment for several hours. Difficulty with balance. Vomiting if you eat too soon. Follow these instructions at home: For the time period you were told by your health care provider:     Rest. Do not participate in activities where you could fall or become injured. Do not drive or use machinery. Do not drink alcohol. Do not take sleeping pills or medicines that cause drowsiness. Do not make important decisions or sign legal documents. Do not take care of children on your own. Eating and drinking  Follow the diet recommended by your health care provider. Drink enough fluid to keep your urine pale yellow. If you vomit: Drink water, juice, or soup when you can drink without vomiting. Make sure you have little or no nausea before eating solid foods. General instructions Take over-the-counter and prescription medicines only as told by your health care provider. Have a responsible adult stay with you for the time you are told. It is important to have someone help care for you until you are awake and alert. Do not smoke. Keep all follow-up visits as told by your health care provider. This is important. Contact a health care provider if: You are still sleepy or having trouble with balance after 24 hours. You feel light-headed. You keep feeling nauseous or you keep vomiting. You develop a rash. You have a fever. You have redness or  swelling around the IV site. Get help right away if: You have trouble breathing. You have new-onset confusion at home. Summary After the procedure, it is common to feel sleepy, have impaired judgment, or feel nauseous if you eat too soon. Rest after you get home. Know the things you should not do after the procedure. Follow the diet recommended by your health care provider and drink enough fluid to keep your urine pale yellow. Get help right away if you have trouble breathing or new-onset confusion at home. This information is not intended to replace advice given to you by your health care provider. Make sure you discuss any questions you have with your health care provider. Document Revised: 03/09/2020 Document Reviewed: 10/06/2019 Elsevier Patient Education  [place].    Bone Marrow Aspiration and Bone Marrow Biopsy, Adult, Care After This sheet gives you information about how to care for yourself after your procedure. Your health care provider may also give you more specific instructions. If you have problems or questions, contact your health care provider. What can I expect after the procedure? After the procedure, it is common to have: Mild pain and tenderness. Swelling. Bruising. Follow these instructions at home: Puncture site care  Follow instructions from your health care provider about how to take care of the puncture site. Make sure you: Wash your hands with soap and water before and after you change your bandage (dressing). If soap and water are not available, use hand sanitizer. Change your dressing as told by your health care provider. Check your puncture site every day for signs of infection. Check for: More redness,  swelling, or pain. Fluid or blood. Warmth. Pus or a bad smell. Activity Return to your normal activities as told by your health care provider. Ask your health care provider what activities are safe for you. Do not lift anything that is heavier  than 10 lb (4.5 kg), or the limit that you are told, until your health care provider says that it is safe. Do not drive for 24 hours if you were given a sedative during your procedure. General instructions  Take over-the-counter and prescription medicines only as told by your health care provider. Do not take baths, swim, or use a hot tub until your health care provider approves. Ask your health care provider if you may take showers. You may only be allowed to take sponge baths. If directed, put ice on the affected area. To do this: Put ice in a plastic bag. Place a towel between your skin and the bag. Leave the ice on for 20 minutes, 2-3 times a day. Keep all follow-up visits as told by your health care provider. This is important. Contact a health care provider if: Your pain is not controlled with medicine. You have a fever. You have more redness, swelling, or pain around the puncture site. You have fluid or blood coming from the puncture site. Your puncture site feels warm to the touch. You have pus or a bad smell coming from the puncture site. Summary After the procedure, it is common to have mild pain, tenderness, swelling, and bruising. Follow instructions from your health care provider about how to take care of the puncture site and what activities are safe for you. Take over-the-counter and prescription medicines only as told by your health care provider. Contact a health care provider if you have any signs of infection, such as fluid or blood coming from the puncture site. This information is not intended to replace advice given to you by your health care provider. Make sure you discuss any questions you have with your health care provider. Document Revised: 03/29/2019 Document Reviewed: 03/29/2019 Elsevier Patient Education  [place] not participate in activities where you could fall or become injured. Do not drive or use machinery. Do not drink  alcohol. Do not take sleeping pills or medicines that cause drowsiness. Do not make important decisions or sign legal documents. Do not take care of children on your own. Eating and drinking  Follow the diet recommended by your health care provider. Drink enough fluid to keep your urine pale yellow. If you vomit: Drink water, juice, or soup when you can drink without vomiting. Make sure you have little or no nausea before eating solid foods. General instructions Take over-the-counter and prescription medicines only as told by your health care provider. Have a responsible adult stay with you for the time you are told. It is important to have someone help care for you until you are awake and alert. Do not smoke. Keep all follow-up visits as told by your health care provider. This is important. Contact a health care provider if: You are still sleepy or having trouble with balance after 24 hours. You feel light-headed. You keep feeling nauseous or you keep vomiting. You develop a rash. You have a fever. You have redness or swelling around the IV site. Get help right away if: You have trouble breathing. You have new-onset confusion at home. Summary After the procedure, it is common to feel sleepy, have impaired judgment, or feel nauseous if you eat too soon.  Rest after you get home. Know the things you should not do after the procedure. Follow the diet recommended by your health care provider and drink enough fluid to keep your urine pale yellow. Get help right away if you have trouble breathing or new-onset confusion at home. This information is not intended to replace advice given to you by your health care provider. Make sure you discuss any questions you have with your health care provider. Document Revised: 03/09/2020 Document Reviewed: 10/06/2019 Elsevier Patient Education  [place].

## 2022-08-11 NOTE — Procedures (Signed)
Interventional Radiology Procedure Note  Procedure: CT guided aspirate and core biopsy of right iliac bone Complications: None Recommendations: - Bedrest supine x 1 hrs - Hydrocodone PRN  Pain - Follow biopsy results  Signed,  Noriah Osgood K. Carmeron Heady, MD   

## 2022-08-13 ENCOUNTER — Other Ambulatory Visit: Payer: Self-pay | Admitting: Hematology

## 2022-08-13 ENCOUNTER — Encounter (HOSPITAL_COMMUNITY)
Admission: RE | Admit: 2022-08-13 | Discharge: 2022-08-13 | Disposition: A | Discharge status: Home / Self Care | Payer: Medicare Other | Source: Ambulatory Visit | Attending: Hematology | Admitting: Hematology

## 2022-08-13 DIAGNOSIS — Z5111 Encounter for antineoplastic chemotherapy: Secondary | ICD-10-CM | POA: Diagnosis present

## 2022-08-13 DIAGNOSIS — C9000 Multiple myeloma not having achieved remission: Secondary | ICD-10-CM

## 2022-08-13 LAB — GLUCOSE, CAPILLARY: Glucose-Capillary: 103 mg/dL — ABNORMAL HIGH (ref 70–99)

## 2022-08-13 LAB — SURGICAL PATHOLOGY

## 2022-08-13 MED ORDER — FLUDEOXYGLUCOSE F - 18 (FDG) INJECTION
10.6300 | Freq: Once | INTRAVENOUS | Status: AC
  Administered 2022-08-13: 10.63 via INTRAVENOUS

## 2022-08-14 ENCOUNTER — Other Ambulatory Visit: Payer: Self-pay

## 2022-08-14 DIAGNOSIS — C9000 Multiple myeloma not having achieved remission: Secondary | ICD-10-CM

## 2022-08-15 ENCOUNTER — Inpatient Hospital Stay (HOSPITAL_BASED_OUTPATIENT_CLINIC_OR_DEPARTMENT_OTHER): Payer: Medicare Other | Admitting: Hematology

## 2022-08-15 ENCOUNTER — Inpatient Hospital Stay: Payer: Medicare Other

## 2022-08-15 VITALS — BP 154/80 | HR 66 | Temp 97.9°F | Resp 15 | Wt 210.0 lb

## 2022-08-15 DIAGNOSIS — M7989 Other specified soft tissue disorders: Secondary | ICD-10-CM

## 2022-08-15 DIAGNOSIS — C9001 Multiple myeloma in remission: Secondary | ICD-10-CM

## 2022-08-15 DIAGNOSIS — Z5112 Encounter for antineoplastic immunotherapy: Secondary | ICD-10-CM | POA: Diagnosis not present

## 2022-08-15 DIAGNOSIS — C9000 Multiple myeloma not having achieved remission: Secondary | ICD-10-CM

## 2022-08-15 DIAGNOSIS — R5383 Other fatigue: Secondary | ICD-10-CM

## 2022-08-15 LAB — CMP (CANCER CENTER ONLY)
ALT: 49 U/L — ABNORMAL HIGH (ref 0–44)
AST: 36 U/L (ref 15–41)
Albumin: 4 g/dL (ref 3.5–5.0)
Alkaline Phosphatase: 38 U/L (ref 38–126)
Anion gap: 1 — ABNORMAL LOW (ref 5–15)
BUN: 14 mg/dL (ref 8–23)
CO2: 33 mmol/L — ABNORMAL HIGH (ref 22–32)
Calcium: 9.7 mg/dL (ref 8.9–10.3)
Chloride: 105 mmol/L (ref 98–111)
Creatinine: 1.16 mg/dL (ref 0.61–1.24)
GFR, Estimated: >60 mL/min (ref >60)
Glucose, Bld: 95 mg/dL (ref 70–99)
Potassium: 3.9 mmol/L (ref 3.5–5.1)
Sodium: 139 mmol/L (ref 135–145)
Total Bilirubin: 1 mg/dL (ref 0.3–1.2)
Total Protein: 6.2 g/dL — ABNORMAL LOW (ref 6.5–8.1)

## 2022-08-15 LAB — CBC WITH DIFFERENTIAL (CANCER CENTER ONLY)
Abs Immature Granulocytes: 0.01 10*3/uL (ref 0.00–0.07)
Basophils Absolute: 0 10*3/uL (ref 0.0–0.1)
Basophils Relative: 1 %
Eosinophils Absolute: 0.1 10*3/uL (ref 0.0–0.5)
Eosinophils Relative: 3 %
HCT: 39.7 % (ref 39.0–52.0)
Hemoglobin: 13.2 g/dL (ref 13.0–17.0)
Immature Granulocytes: 0 %
Lymphocytes Relative: 13 %
Lymphs Abs: 0.5 10*3/uL — ABNORMAL LOW (ref 0.7–4.0)
MCH: 27.8 pg (ref 26.0–34.0)
MCHC: 33.2 g/dL (ref 30.0–36.0)
MCV: 83.8 fL (ref 80.0–100.0)
Monocytes Absolute: 0.8 10*3/uL (ref 0.1–1.0)
Monocytes Relative: 19 %
Neutro Abs: 2.6 10*3/uL (ref 1.7–7.7)
Neutrophils Relative %: 64 %
Platelet Count: 128 10*3/uL — ABNORMAL LOW (ref 150–400)
RBC: 4.74 MIL/uL (ref 4.22–5.81)
RDW: 18.2 % — ABNORMAL HIGH (ref 11.5–15.5)
WBC Count: 4.1 10*3/uL (ref 4.0–10.5)
nRBC: 0 % (ref 0.0–0.2)

## 2022-08-18 ENCOUNTER — Encounter (HOSPITAL_COMMUNITY): Payer: Self-pay | Admitting: Hematology

## 2022-08-18 LAB — KAPPA/LAMBDA LIGHT CHAINS
Kappa free light chain: 24.1 mg/L — ABNORMAL HIGH (ref 3.3–19.4)
Kappa, lambda light chain ratio: 2.11 — ABNORMAL HIGH (ref 0.26–1.65)
Lambda free light chains: 11.4 mg/L (ref 5.7–26.3)

## 2022-08-19 ENCOUNTER — Other Ambulatory Visit: Payer: Self-pay | Admitting: Hematology

## 2022-08-19 ENCOUNTER — Ambulatory Visit (HOSPITAL_COMMUNITY)
Admission: RE | Admit: 2022-08-19 | Discharge: 2022-08-19 | Disposition: A | Discharge status: Home / Self Care | Payer: Medicare Other | Source: Ambulatory Visit | Attending: Hematology | Admitting: Hematology

## 2022-08-19 ENCOUNTER — Telehealth: Payer: Self-pay

## 2022-08-19 DIAGNOSIS — M7989 Other specified soft tissue disorders: Secondary | ICD-10-CM | POA: Diagnosis present

## 2022-08-19 MED ORDER — APIXABAN (ELIQUIS) VTE STARTER PACK (10MG AND 5MG): ORAL_TABLET | ORAL | 0 refills | Status: DC

## 2022-08-19 NOTE — Progress Notes (Signed)
Lower extremity venous right study completed.  Preliminary results relayed to Eustaquio Maize, RN for Irene Limbo, MD.  See CV Proc for preliminary results report.   Darlin Coco, RDMS, RVT

## 2022-08-19 NOTE — Telephone Encounter (Signed)
Contacted pt to let him know Dr Irene Limbo sent in a prescription for eliquis to tx new dx DVT. Pt instructed to stop revlimid and hold it until he speaks with Dr Irene Limbo in a week. Pt also instructed to stop ASA. Pt acknowledged information and verbalized understanding.

## 2022-08-20 ENCOUNTER — Encounter (HOSPITAL_COMMUNITY): Payer: Self-pay | Admitting: Hematology

## 2022-08-20 ENCOUNTER — Ambulatory Visit (HOSPITAL_COMMUNITY): Payer: Medicare Other

## 2022-08-20 NOTE — Progress Notes (Addendum)
[name]   HEMATOLOGY/ONCOLOGY CLINIC NOTE   Date of Service: 08/15/2022  Patient Care Team: [name], MD as PCP - General (Family Medicine) [name], MD as Consulting Physician (Hematology)  CHIEF COMPLAINTS/PURPOSE OF CONSULTATION:  Follow-up for continued evaluation and management of multiple myeloma  HISTORY OF PRESENTING ILLNESS:  Please see previous note for details of initial presentation  INTERVAL HISTORY Nathan Martinez is a 76 y.o. male here for continued evaluation and management of multiple myeloma.  He notes grade 1 fatigue and we discussed a possible referral to cancer rehab for fatigue and decreased stamina status post induction treatment for myeloma which he is agreeable to.  We discussed getting US venous lower extremities in 1-2 days which he is agreeable to.to evaluate his RLE swelling.  He is tolerating the current dose Revlimid at 25 mg p.o daily with no prohibitive toxicities at this time and is transitioning to   He notes no significant signs of neuropathy from his current Velcade.  No fevers no chills no night sweats.  No significant new fatigue. No loose stools or change in bowel habits. No other new or acute focal symptoms.  No other notable toxicities from his current treatment.    Labs done today were reviewed in detail with the patient.  We discussed his PET/CT scan done 08/13/2022 which revealed no findings suggestive of disease progression. Otherwise reviewed as noted below.  We discussed his CT bone marrow biopsy and aspiration done 08/11/2022 which revealed no findings suggestive of disease progression. Otherwise reviewed as noted below.  MEDICAL HISTORY:  Intermittent hypercalcemia - primary hyperparathyroidism status post parathyroidectomy in 2019 HTN HLD Glaucoma History of Helicobacter pylori infection previously treated Chronic leukopenia/neutropenia History of present iron deficiency anemia  SURGICAL HISTORY: Rt TKA  2018 Parathyroidectomy 2019 B/l cataracts sx 2021  SOCIAL HISTORY: Social History   Socioeconomic History   Marital status: Married    Spouse name: Not on file   Number of children: Not on file   Years of education: Not on file   Highest education level: Not on file  Occupational History   Not on file  Tobacco Use   Smoking status: Never   Smokeless tobacco: Never  Vaping Use   Vaping Use: Never used  Substance and Sexual Activity   Alcohol use: Not Currently   Drug use: Never   Sexual activity: Not Currently    Birth control/protection: None  Other Topics Concern   Not on file  Social History Narrative   Not on file   Social Determinants of Health   Financial Resource Strain: Not on file  Food Insecurity: Not on file  Transportation Needs: Not on file  Physical Activity: Not on file  Stress: Not on file  Social Connections: Not on file  Intimate Partner Violence: Not on file  Ex smoker (quit 1976) 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  MEDICATIONS:  Krill oil Olmesartan hydrochlorothiazide 20-12.5 mg Ferrex 150 mg p.o. daily Aspirin 81 mg p.o. daily Atorvastatin 20 mg p.o. daily Multivitamin 1 tablet p.o. daily Ergocalciferol 50,000 units once weekly   REVIEW OF SYSTEMS:   10 Point review of Systems was done is negative except as noted above.  PHYSICAL EXAMINATION: .BP (!) 154/80 (BP Location: Left Arm, Patient Position: Sitting) Comment: Nurse notified  Pulse 66   Temp  97.9 F (36.6 C) (Temporal)   Resp 15   Wt 210 lb (95.3 kg)   SpO2 100%   BMI 27.71 kg/m  NAD GENERAL:alert, in no acute distress and comfortable SKIN: no acute rashes, no significant lesions EYES: conjunctiva are pink and non-injected, sclera anicteric NECK: supple, no JVD LYMPH:  no palpable lymphadenopathy in the  cervical, axillary or inguinal regions LUNGS: clear to auscultation b/l with normal respiratory effort HEART: regular rate & rhythm ABDOMEN:  normoactive bowel sounds , non tender, not distended. Extremity: no pedal edema PSYCH: alert & oriented x 3 with fluent speech NEURO: no focal motor/sensory deficits  LABORATORY DATA:  I have reviewed the data as listed  .    Latest Ref Rng & Units 08/15/2022   10:54 AM 08/11/2022    8:07 AM 07/25/2022    2:24 PM  CBC  WBC 4.0 - 10.5 K/uL 4.1  5.1  4.5   Hemoglobin 13.0 - 17.0 g/dL 13.2  13.0  12.2   Hematocrit 39.0 - 52.0 % 39.7  40.0  36.1   Platelets 150 - 400 K/uL 128  143  144    CBC    Component Value Date/Time   WBC 4.1 08/15/2022 1054   WBC 5.1 08/11/2022 0807   RBC 4.74 08/15/2022 1054   HGB 13.2 08/15/2022 1054   HCT 39.7 08/15/2022 1054   PLT 128 (L) 08/15/2022 1054   MCV 83.8 08/15/2022 1054   MCH 27.8 08/15/2022 1054   MCHC 33.2 08/15/2022 1054   RDW 18.2 (H) 08/15/2022 1054   LYMPHSABS 0.5 (L) 08/15/2022 1054   MONOABS 0.8 08/15/2022 1054   EOSABS 0.1 08/15/2022 1054   BASOSABS 0.0 08/15/2022 1054    .    Latest Ref Rng & Units 08/15/2022   10:54 AM 07/25/2022    2:24 PM 07/18/2022    1:38 PM  CMP  Glucose 70 - 99 mg/dL 95  99  92   BUN 8 - 23 mg/dL 14  15  14    Creatinine 0.61 - 1.24 mg/dL 1.16  1.07  0.92   Sodium 135 - 145 mmol/L 139  141  138   Potassium 3.5 - 5.1 mmol/L 3.9  3.9  4.4   Chloride 98 - 111 mmol/L 105  109  107   CO2 22 - 32 mmol/L 33  29  29   Calcium 8.9 - 10.3 mg/dL 9.7  9.7  9.8   Total Protein 6.5 - 8.1 g/dL 6.2  5.7  5.8   Total Bilirubin 0.3 - 1.2 mg/dL 1.0  0.8  0.9   Alkaline Phos 38 - 126 U/L 38  35  36   AST 15 - 41 U/L 36  21  20   ALT 0 - 44 U/L 49  27  29      IEOP/Immunofixation Order: [id number] Component 9 d ago  PATHOLOGIST CHARGE ID  [id number] - Jamison Neighbor, M.D.   SERUM IEOP COMMENT   NO DEFINITE CLONALITY DETECTED.   Reviewed and Interpreted By    Jamison Neighbor, M.D.       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  PLAN -Labs done today were reviewed in detail with the patient as noted above CBC stable wih Hgb of 13.2, WBC count of 4.1k, and platelets of 128k. CMP unremarkable. MM panel done 08/15/2022 with no M spike and neg IFE. -Patient notes no notable toxicities from his current RVD regimen. Orders reviewed and placed. -conitnue same supportive medications. -Continue Revlimid switching to maintenance @ 79m3w on 1wk off -Continue monthly Zometa -Continue vitamin D at least 2000 units daily. -We discussed trying Marinol for a month to see if appetite improves. [facility] Dr LAris Lotthe patient chooses to hold off on Auto HSCT and preferrs  4) leg swelling due to steroids -patient using compression socks -Continue low dose Lasix. He can reduce taking Lasix to 3 days and if leg swelling continues to be well managed then he can stop it completely and only take as needed. -We discussed his PET/CT scan done 08/13/2022 which revealed no findings suggestive of disease progression. Otherwise reviewed as noted above. -We discussed his CT bone marrow biopsy and aspiration done 08/11/2022 which revealed no findings suggestive of disease progression. Otherwise reviewed as noted above. -We will start him on Maintenance Revlimid.32 -Scheduled UKoreavenous lower  extremities in 1-2 days.  #3 Stye -warm water soaks Eythromycin ointment -continue f/u with ophthamoloy  Follow-up Referral to cancer rehab for fatigue and decreased stamina status post induction treatment for myeloma UKoreavenous lower extremities in 1-2 days Please schedule Zometa every 3 months x 4 Return to clinic with Dr. KIrene Limboin 2 months. Labs 1 week prior to clinic visit  The total time spent in the appointment was 32 minutes*.  All of the patient's questions were answered with apparent satisfaction. The patient knows to call the clinic with any problems, questions or concerns.   GSullivan LoneMD MS AAHIVMS SDeer Pointe Surgical Center LLCCChi St [facility] Hospital Hot SpringsHematology/Oncology Physician CLewis And Clark [facility] .*Total Encounter Time as defined by the Centers for Medicare and Medicaid Services includes, in addition to the face-to-face time of a patient visit (documented in the note above) non-face-to-face time: obtaining and reviewing outside history, ordering and reviewing medications, tests or procedures, care coordination (communications with other health care professionals or caregivers) and documentation in the medical record.  I, GMelene Muller am acting as scribe for Dr. GSullivan Lone MD.   .The total time spent in the appointment was 31 minutes* .  All of the patient's questions were answered with apparent satisfaction. The patient knows to call the clinic with any problems, questions or concerns.   GSullivan LoneMD MS AAHIVMS SCharlotte [facility] D[facility] Museum CampusCNorth South Daytona Baptist HospitalHematology/Oncology Physician CRockledge [facility] .*Total Encounter Time as defined by the Centers for Medicare and Medicaid Services includes, in addition to the face-to-face time of a patient visit (documented in the note above) non-face-to-face time: obtaining and reviewing outside history, ordering and reviewing medications, tests or procedures, care coordination (communications with other health care professionals or caregivers) and documentation in the medical  record.  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 -Eliquis stand to his pharma list  .I have reviewed the above documentation for accuracy and completeness, and I agree with the above. .Brunetta GeneraMD

## 2022-08-21 ENCOUNTER — Encounter: Payer: Self-pay | Admitting: Hematology

## 2022-08-21 LAB — MULTIPLE MYELOMA PANEL, SERUM
Albumin SerPl Elph-Mcnc: 3.8 g/dL (ref 2.9–4.4)
Albumin/Glob SerPl: 1.6 (ref 0.7–1.7)
Alpha 1: 0.2 g/dL (ref 0.0–0.4)
Alpha2 Glob SerPl Elph-Mcnc: 0.6 g/dL (ref 0.4–1.0)
B-Globulin SerPl Elph-Mcnc: 0.8 g/dL (ref 0.7–1.3)
Gamma Glob SerPl Elph-Mcnc: 0.8 g/dL (ref 0.4–1.8)
Globulin, Total: 2.4 g/dL (ref 2.2–3.9)
IgA: 59 mg/dL — ABNORMAL LOW (ref 61–437)
IgG (Immunoglobin G), Serum: 830 mg/dL (ref 603–1613)
IgM (Immunoglobulin M), Srm: 22 mg/dL (ref 15–143)
Total Protein ELP: 6.2 g/dL (ref 6.0–8.5)

## 2022-08-21 NOTE — Progress Notes (Incomplete)
[name]   HEMATOLOGY/ONCOLOGY CLINIC NOTE   Date of Service: 08/15/2022  Patient Care Team: Nathan Agreste, MD as PCP - General (Family Medicine) [name], MD as Consulting Physician (Hematology)  CHIEF COMPLAINTS/PURPOSE OF CONSULTATION:  Follow-up for continued evaluation and management of multiple myeloma  HISTORY OF PRESENTING ILLNESS:  Please see previous note for details of initial presentation  INTERVAL HISTORY Nathan Martinez is a 76 y.o. male here for continued evaluation and management of multiple myeloma.  He notes grade 1 fatigue and we discussed a possible referral to cancer rehab for fatigue and decreased stamina status post induction treatment for myeloma which he is agreeable to.  We discussed getting US venous lower extremities in 1-2 days which he is agreeable to.to evaluate his RLE swelling.  He is tolerating the current dose Revlimid at 25 mg p.o daily with no prohibitive toxicities at this time and is transitioning to   He notes no significant signs of neuropathy from his current Velcade.  No fevers no chills no night sweats.  No significant new fatigue. No loose stools or change in bowel habits. No other new or acute focal symptoms.  No other notable toxicities from his current treatment.    Labs done today were reviewed in detail with the patient.  We discussed his PET/CT scan done 08/13/2022 which revealed no findings suggestive of disease progression. Otherwise reviewed as noted below.  We discussed his CT bone marrow biopsy and aspiration done 08/11/2022 which revealed no findings suggestive of disease progression. Otherwise reviewed as noted below.  MEDICAL HISTORY:  Intermittent hypercalcemia - primary hyperparathyroidism status post parathyroidectomy in 2019 HTN HLD Glaucoma History of Helicobacter pylori infection previously treated Chronic leukopenia/neutropenia History of present iron deficiency anemia  SURGICAL HISTORY: Rt TKA  2018 Parathyroidectomy 2019 B/l cataracts sx 2021  SOCIAL HISTORY: Social History   Socioeconomic History  . Marital status: Married    Spouse name: Not on file  . Number of children: Not on file  . Years of education: Not on file  . Highest education level: Not on file  Occupational History  . Not on file  Tobacco Use  . Smoking status: Never  . Smokeless tobacco: Never  Vaping Use  . Vaping Use: Never used  Substance and Sexual Activity  . Alcohol use: Not Currently  . Drug use: Never  . Sexual activity: Not Currently    Birth control/protection: None  Other Topics Concern  . Not on file  Social History Narrative  . 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  MEDICATIONS:  Krill oil Olmesartan hydrochlorothiazide 20-12.5 mg Ferrex 150 mg p.o. daily Aspirin 81 mg p.o. daily Atorvastatin 20 mg p.o. daily Multivitamin 1 tablet p.o. daily Ergocalciferol 50,000 units once weekly   REVIEW OF SYSTEMS:   10 Point review of Systems was done is negative except as noted above.  PHYSICAL EXAMINATION: .BP (!) 154/80 (BP Location: Left Arm, Patient Position: Sitting) Comment: Nurse notified  Pulse 66   Temp  97.9 F (36.6 C) (Temporal)   Resp 15   Wt 210 lb (95.3 kg)   SpO2 100%   BMI 27.71 kg/m  NAD GENERAL:alert, in no acute distress and comfortable SKIN: no acute rashes, no significant lesions EYES: conjunctiva are pink and non-injected, sclera anicteric NECK: supple, no JVD LYMPH:  no palpable  lymphadenopathy in the cervical, axillary or inguinal regions LUNGS: clear to auscultation b/l with normal respiratory effort HEART: regular rate & rhythm ABDOMEN:  normoactive bowel sounds , non tender, not distended. Extremity: no pedal edema PSYCH: alert & oriented x 3 with fluent speech NEURO: no focal motor/sensory deficits  LABORATORY DATA:  I have reviewed the data as listed  .    Latest Ref Rng & Units 08/15/2022   10:54 AM 08/11/2022    8:07 AM 07/25/2022    2:24 PM  CBC  WBC 4.0 - 10.5 K/uL 4.1  5.1  4.5   Hemoglobin 13.0 - 17.0 g/dL 13.2  13.0  12.2   Hematocrit 39.0 - 52.0 % 39.7  40.0  36.1   Platelets 150 - 400 K/uL 128  143  144    CBC    Component Value Date/Time   WBC 4.1 08/15/2022 1054   WBC 5.1 08/11/2022 0807   RBC 4.74 08/15/2022 1054   HGB 13.2 08/15/2022 1054   HCT 39.7 08/15/2022 1054   PLT 128 (L) 08/15/2022 1054   MCV 83.8 08/15/2022 1054   MCH 27.8 08/15/2022 1054   MCHC 33.2 08/15/2022 1054   RDW 18.2 (H) 08/15/2022 1054   LYMPHSABS 0.5 (L) 08/15/2022 1054   MONOABS 0.8 08/15/2022 1054   EOSABS 0.1 08/15/2022 1054   BASOSABS 0.0 08/15/2022 1054    .    Latest Ref Rng & Units 08/15/2022   10:54 AM 07/25/2022    2:24 PM 07/18/2022    1:38 PM  CMP  Glucose 70 - 99 mg/dL 95  99  92   BUN 8 - 23 mg/dL _0 Creatinine 0.61 - 1.24 mg/dL 1.16  1.07  0.92   Sodium 135 - 145 mmol/L 139  141  138   Potassium 3.5 - 5.1 mmol/L 3.9  3.9  4.4   Chloride 98 - 111 mmol/L 105  109  107   CO2 22 - 32 mmol/L 33  29  29   Calcium 8.9 - 10.3 mg/dL 9.7  9.7  9.8   Total Protein 6.5 - 8.1 g/dL 6.2  5.7  5.8   Total Bilirubin 0.3 - 1.2 mg/dL 1.0  0.8  0.9   Alkaline Phos 38 - 126 U/L 38  35  36   AST 15 - 41 U/L 36  21  20   ALT 0 - 44 U/L 49  27  29      IEOP/Immunofixation Order: [id number] Component 9 d ago  PATHOLOGIST CHARGE ID  [id number] - Jamison Neighbor, M.D.   SERUM IEOP COMMENT   NO DEFINITE CLONALITY DETECTED.   Reviewed and  Interpreted By   Jamison Neighbor, M.D.       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  PLAN -Labs done today were reviewed in detail with the patient as noted above CBC stable wih Hgb of 13.2, WBC count of 4.1k, and platelets of 128k. CMP unremarkable. MM panel done 08/15/2022 with no M spike and neg IFE. -Patient notes no notable toxicities from his current RVD regimen. Orders reviewed and placed. -conitnue same supportive medications. -Continue Revlimid switching to maintenance @ 47m3w on 1wk off -Continue monthly Zometa -Continue vitamin D at least 2000 units daily. -We discussed trying Marinol for a month to see if appetite improves. [facility] and Dr LAris Lotthe patient chooses to hold off on Auto HSCT and preferrs  4) leg swelling due to steroids -patient using compression socks -Continue low dose Lasix. He can reduce taking Lasix to 3 days and if leg swelling continues to be well managed then he can stop it completely and only take as needed. -We discussed his PET/CT scan done 08/13/2022 which revealed no findings suggestive of disease progression. Otherwise reviewed as noted above. -We discussed his CT bone marrow biopsy and aspiration done 08/11/2022 which revealed no findings suggestive of disease progression. Otherwise reviewed as noted above. -We will start him on Maintenance Revlimid.32 -Scheduled [place] venous lower extremities in 1-2 days.  #3 Stye -warm water soaks Eythromycin ointment -continue f/u with ophthamoloy  Follow-up Referral to cancer rehab for fatigue and decreased stamina status post induction treatment for myeloma [place]avenous lower extremities in 1-2 days Please schedule Zometa every 3 months x 4 Return to clinic with Dr. KIrene Limboin 2 months. Labs 1 week prior to clinic visit  The total time spent in the appointment was 32 minutes*.  All of the patient's questions were answered with apparent satisfaction. The patient knows to call the clinic with any problems, questions or concerns.   GSullivan LoneMD MS AAHIVMS SS[facility] SystemCEssent[facility] VirginiaHematology/Oncology Physician [facility] .*Total Encounter Time as defined by the Centers for Medicare and Medicaid Services includes, in addition to the face-to-face time of a patient visit (documented in the note above) non-face-to-face time: obtaining and reviewing outside history, ordering and reviewing medications, tests or procedures, care coordination (communications with other health care professionals or caregivers) and documentation in the medical record.  I, GMelene Muller am acting as scribe for Dr. GSullivan Lone MD.   .The total time spent in the appointment was 31 minutes* .  All of the patient's questions were answered with apparent satisfaction. The patient knows to call the clinic with any problems, questions or concerns.   GSullivan LoneMD MS AAHIVMS SU[facility]CCKind[facility] BostonHematology/Oncology Physician [facility] .*Total Encounter Time as defined by the Centers for Medicare and Medicaid Services includes, in addition to the face-to-face time of a patient visit (documented in the note above) non-face-to-face time: obtaining and reviewing outside history, ordering and reviewing medications, tests or procedures, care coordination (communications with other health care professionals or caregivers) and documentation in the  medical record.  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 -Eliquis stand to his pharmact.

## 2022-08-22 ENCOUNTER — Ambulatory Visit: Payer: Medicare Other | Admitting: Rehabilitation

## 2022-08-22 ENCOUNTER — Encounter: Payer: Self-pay | Admitting: Hematology

## 2022-08-25 ENCOUNTER — Encounter: Payer: Self-pay | Admitting: Family Medicine

## 2022-08-25 ENCOUNTER — Ambulatory Visit (INDEPENDENT_AMBULATORY_CARE_PROVIDER_SITE_OTHER): Payer: Medicare Other | Admitting: Family Medicine

## 2022-08-25 VITALS — BP 122/70 | HR 69 | Temp 98.4°F | Ht 73.0 in | Wt 206.8 lb

## 2022-08-25 DIAGNOSIS — G620 Drug-induced polyneuropathy: Secondary | ICD-10-CM

## 2022-08-25 DIAGNOSIS — I82411 Acute embolism and thrombosis of right femoral vein: Secondary | ICD-10-CM

## 2022-08-25 DIAGNOSIS — Z23 Encounter for immunization: Secondary | ICD-10-CM

## 2022-08-25 MED ORDER — GABAPENTIN 100 MG PO CAPS: 100.0000 mg | ORAL_CAPSULE | Freq: Two times a day (BID) | ORAL | 1 refills | Status: DC

## 2022-08-25 NOTE — Progress Notes (Addendum)
Subjective:  Patient ID: Nathan Martinez, male    DOB: [birth year]  Age: 76 y.o. MRN: [id number]  CC:  Chief Complaint  Patient presents with   Peripheral Neuropathy    Pt states his feet hurt and he would like to know if he needs a referral to a podiatrist     HPI Nathan Martinez presents for   Foot pain Noted after taking Velcade injections. Past month, cold, burning feeling. Tingling, irritating.  Followed by hematology for multiple myeloma, treated with Revlimid.  PET/CT on September 20 with no findings suggestive of disease progression.  CT bone marrow biopsy and aspiration on September 18 without findings of disease progression. Tx: otc foot rub/cream. Minimal relief.    DVT Recently diagnosed with acute DVT of the right proximal, mid and distal femoral vein, right popliteal vein and right peroneal veins.  Noted on ultrasound September 26 started on Eliquis by hematology.  Instructed to stop Revlimid and hold it until follow-up with Dr. Irene Limbo in 1 week on September 26 phone message.  Also advised to stop aspirin. Appointment with Dr. Irene Limbo on 08/29/2022. No hematuria, blood in stool or new bleeding.  No Cp/dyspnea. Some persistent fatigue.    History Patient Active Problem List   Diagnosis Date Noted   Multiple myeloma not having achieved remission ([place]) 04/16/2022   Multiple myeloma ([place]) 01/24/2022   Counseling regarding advance care planning and goals of care 01/24/2022   Past Medical History:  Diagnosis Date   Hypertension    Past Surgical History:  Procedure Laterality Date   KNEE SURGERY Right 2018   PARATHYROID EXPLORATION     No Known Allergies Prior to Admission medications   Medication Sig Start Date End Date Taking? Authorizing Provider  acyclovir (ZOVIRAX) 400 MG tablet Take 1 tablet by mouth twice daily 07/14/22  Yes [name], MD  APIXABAN Arne [place]) VTE STARTER PACK (10MG AND 5MG) Take as directed on package: start with two-41m tablets twice daily  for 7 days. On day 8, switch to one-536mtablet twice daily. 08/19/22  Yes KaBrunetta GeneraMD  atorvastatin (LIPITOR) 20 MG tablet Take 1 tablet (20 mg total) by mouth at bedtime. 05/09/22  Yes GrWendie AgresteMD  ergocalciferol (VITAMIN D2) 1.25 MG (50000 UT) capsule Take 1 capsule (50,000 Units total) by mouth once a week. 06/13/22  Yes GrWendie AgresteMD  erythromycin ophthalmic ointment Place 1 Application into both eyes in the morning and at bedtime. 05/23/22  Yes KaBrunetta GeneraMD  iron polysaccharides (NIFEREX) 150 MG capsule Take 1 capsule (150 mg total) by mouth daily. 01/23/22  Yes KaBrunetta GeneraMD  Krill Oil (OMEGA-3) 500 MG CAPS  09/30/16  Yes [provider]  latanoprost (XALATAN) 0.005 % ophthalmic solution SMARTSIG:1 Drop(s) In Eye(s) Every Evening 01/28/22  Yes [provider]  lenalidomide (REVLIMID) 10 MG capsule Take 1 capsule (10 mg total) by mouth daily. Take 1 capsule (10 mg total) daily by mouth for 21 days then take 7 days off. 08/13/22  Yes KaBrunetta GeneraMD  loratadine (CLARITIN) 10 MG tablet Take 10 mg by mouth daily.   Yes [provider]  Magnesium Oxide 400 MG CAPS    Yes [provider]  Multiple Vitamins-Minerals (MULTIVITAMIN MEN 50+) TABS Take by mouth.   Yes [provider]  olmesartan (BENICAR) 20 MG tablet Take 1 tablet (20 mg total) by mouth daily. 05/09/22  Yes GrWendie AgresteMD  Olopatadine  HCl 0.2 % SOLN Apply 1 drop to eye daily. 05/23/22  Yes [name], MD  dexamethasone (DECADRON) 4 MG tablet TAKE 5 TABLETS BY MOUTH ONCE A WEEK PRIOR TO TREATMENT Patient not taking: Reported on 08/25/2022 07/14/22   [name], MD  doxycycline (VIBRA-TABS) 100 MG tablet Take 1 tablet (100 mg total) by mouth 2 (two) times daily. For infected Stye in patient with myeloma Patient not taking: Reported on 08/25/2022 07/18/22   [name], MD  dronabinol (MARINOL) 2.5 MG capsule Take  1 capsule (2.5 mg total) by mouth 2 (two) times daily before a meal. Patient not taking: Reported on 08/25/2022 05/23/22   [name], MD  furosemide (LASIX) 20 MG tablet Take 1 tablet (20 mg total) by mouth daily. Patient not taking: Reported on 08/25/2022 05/11/22   [name], MD  LORazepam (ATIVAN) 0.5 MG tablet Take 1 tablet (0.5 mg total) by mouth every 6 (six) hours as needed (Nausea or vomiting). Patient not taking: Reported on 08/25/2022 02/14/22   [name], MD  ondansetron (ZOFRAN) 8 MG tablet Take 1 tablet (8 mg total) by mouth 2 (two) times daily as needed (Nausea or vomiting). Patient not taking: Reported on 08/25/2022 02/14/22   [name], MD  prochlorperazine (COMPAZINE) 10 MG tablet Take 1 tablet (10 mg total) by mouth every 6 (six) hours as needed (Nausea or vomiting). Patient not taking: Reported on 08/25/2022 02/14/22   [name], MD  tadalafil (CIALIS) 5 MG tablet  02/01/15   [provider]   Social History   Socioeconomic History   Marital status: Married    Spouse name: Not on file   Number of children: Not on file   Years of education: Not on file   Highest education level: Not on file  Occupational History   Not on file  Tobacco Use   Smoking status: Never   Smokeless tobacco: Never  Vaping Use   Vaping Use: Never used  Substance and Sexual Activity   Alcohol use: Not Currently   Drug use: Never   Sexual activity: Not Currently    Birth control/protection: None  Other Topics Concern   Not on file  Social History Narrative   Not on file   Social Determinants of Health   Financial Resource Strain: Not on file  Food Insecurity: Not on file  Transportation Needs: Not on file  Physical Activity: Not on file  Stress: Not on file  Social Connections: Not on file  Intimate Partner Violence: Not on file    Review of Systems Per HPI.   Objective:   Vitals:   08/25/22 1605  BP: 122/70  Pulse: 69   Temp: 98.4 F (36.9 C)  SpO2: 100%  Weight: 206 lb 12.8 oz (93.8 kg)  Height: 6' 1"  (1.854 m)     Physical Exam Vitals reviewed.  Constitutional:      Appearance: He is well-developed.  HENT:     Head: Normocephalic and atraumatic.  Neck:     Vascular: No carotid bruit or JVD.  Cardiovascular:     Rate and Rhythm: Normal rate and regular rhythm.     Heart sounds: Normal heart sounds. No murmur heard. Pulmonary:     Effort: Pulmonary effort is normal.     Breath sounds: Normal breath sounds. No rales.  Musculoskeletal:     Right lower leg: Edema (Edema right ankle, right calf, tender posterior calf.  No skin changes.) present.  Left lower leg: No edema.  Skin:    General: Skin is warm and dry.     Comments: Skin intact at feet bilaterally, dysesthesias, discomfort plantar feet.  Cap refill less than 1 second toes bilaterally.  Neurological:     Mental Status: He is alert and oriented to person, place, and time.  Psychiatric:        Mood and Affect: Mood normal.        Assessment & Plan:  Nathan Martinez is a 76 y.o. male . Drug-induced polyneuropathy ([place]) - Plan: gabapentin (NEURONTIN) 100 MG capsule  -Based on timing suspect peripheral neuropathy from Velcade.  Can discuss with hematologist at upcoming visit, but will start gabapentin 100 mg nightly with transition to twice daily dosing if tolerated after 1 week.  Option of higher dosing if needed.  RTC precautions, handout given.   Need for influenza vaccination - Plan: Flu Vaccine QUAD High Dose(Fluad)  Acute deep vein thrombosis (DVT) of femoral vein of right lower extremity ([facility]) Tolerating Eliquis.  No chest pain or dyspnea.  No new bleeding.  Follow-up with hematology as planned.  Meds ordered this encounter  Medications   gabapentin (NEURONTIN) 100 MG capsule    Sig: Take 1 capsule (100 mg total) by mouth 2 (two) times daily. Start 1 at bedtime for first week, then increase if tolerated.    Dispense:  60  capsule    Refill:  1   Patient Instructions  Nerve pain in feet may be due to Velcade. Keep follow up with Dr. Irene Limbo to discuss further, but can try gabapentin 17m at bedtime initially, then twice per day after 1 week if tolerated and still daytime symptoms. We have option to increase dose if needed. Keep me posted.   Thanks for coming in today.   Peripheral Neuropathy Peripheral neuropathy is a type of nerve damage. It affects nerves that carry signals between the spinal cord and the arms, legs, and the rest of the body (peripheral nerves). It does not affect nerves in the spinal cord or brain. In peripheral neuropathy, one nerve or a group of nerves may be damaged. Peripheral neuropathy is a broad category that includes many specific nerve disorders, like diabetic neuropathy, hereditary neuropathy, and carpal tunnel syndrome. What are the causes? This condition may be caused by: Certain diseases, such as: Diabetes. This is the most common cause of peripheral neuropathy. Autoimmune diseases, such as rheumatoid arthritis and systemic lupus erythematosus. Nerve diseases that are passed from parent to child (inherited). Kidney disease. Thyroid disease. Other causes may include: Nerve injury. Pressure or stress on a nerve that lasts a long time. Lack (deficiency) of B vitamins. This can result from alcoholism, poor diet, or a restricted diet. Infections. Some medicines, such as cancer medicines (chemotherapy). Poisonous (toxic) substances, such as lead and mercury. Too little blood flowing to the legs. In some cases, the cause of this condition is not known. What are the signs or symptoms? Symptoms of this condition depend on which of your nerves is damaged. Symptoms in the legs, hands, and arms can include: Loss of feeling (numbness) in the feet, hands, or both. Tingling in the feet, hands, or both. Burning pain. Very sensitive skin. Weakness. Not being able to move a part of the  body (paralysis). Clumsiness or poor coordination. Muscle twitching. Loss of balance. Symptoms in other parts of the body can include: Not being able to control your bladder. Feeling dizzy. Sexual problems. How is this diagnosed? Diagnosing  and finding the cause of peripheral neuropathy can be difficult. Your health care provider will take your medical history and do a physical exam. A neurological exam will also be done. This involves checking things that are affected by your brain, spinal cord, and nerves (nervous system). For example, your health care provider will check your reflexes, how you move, and what you can feel. You may have other tests, such as: Blood tests. Electromyogram (EMG) and nerve conduction tests. These tests check nerve function and how well the nerves are controlling the muscles. Imaging tests, such as a CT scan or MRI, to rule out other causes of your symptoms. Removing a small piece of nerve to be examined in a lab (nerve biopsy). Removing and examining a small amount of the fluid that surrounds the brain and spinal cord (lumbar puncture). How is this treated? Treatment for this condition may involve: Treating the underlying cause of the neuropathy, such as diabetes, kidney disease, or vitamin deficiencies. Stopping medicines that can cause neuropathy, such as chemotherapy. Medicine to help relieve pain. Medicines may include: Prescription or over-the-counter pain medicine. Anti-seizure medicine. Antidepressants. Pain-relieving patches that are applied to painful areas of skin. Surgery to relieve pressure on a nerve or to destroy a nerve that is causing pain. Physical therapy to help improve movement and balance. Devices to help you move around (assistive devices). Follow these instructions at home: Medicines Take over-the-counter and prescription medicines only as told by your health care provider. Do not take any other medicines without first asking your  health care provider. Ask your health care provider if the medicine prescribed to you requires you to avoid driving or using machinery. Lifestyle  Do not use any products that contain nicotine or tobacco. These products include cigarettes, chewing tobacco, and vaping devices, such as e-cigarettes. Smoking keeps blood from reaching damaged nerves. If you need help quitting, ask your health care provider. Avoid or limit alcohol. Too much alcohol can cause a vitamin B deficiency, and vitamin B is needed for healthy nerves. Eat a healthy diet. This includes: Eating foods that are high in fiber, such as beans, whole grains, and fresh fruits and vegetables. Limiting foods that are high in fat and processed sugars, such as fried or sweet foods. General instructions  If you have diabetes, work closely with your health care provider to keep your blood sugar under control. If you have numbness in your feet: Check every day for signs of injury or infection. Watch for redness, warmth, and swelling. Wear padded socks and comfortable shoes. These help protect your feet. Develop a good support system. Living with peripheral neuropathy can be stressful. Consider talking with a mental health specialist or joining a support group. Use assistive devices and attend physical therapy as told by your health care provider. This may include using a walker or a cane. Keep all follow-up visits. This is important. Where to find more information [employer] of Neurological Disorders: [employer]it Contact a health care provider if: You have new signs or symptoms of peripheral neuropathy. You are struggling emotionally from dealing with peripheral neuropathy. Your pain is not well controlled. Get help right away if: You have an injury or infection that is not healing normally. You develop new weakness in an arm or leg. You have fallen or do so frequently. Summary Peripheral neuropathy is when the nerves in  the arms or legs are damaged, resulting in numbness, weakness, or pain. There are many causes of peripheral neuropathy, including diabetes, pinched  nerves, vitamin deficiencies, autoimmune disease, and hereditary conditions. Diagnosing and finding the cause of peripheral neuropathy can be difficult. Your health care provider will take your medical history, do a physical exam, and do tests, including blood tests and nerve function tests. Treatment involves treating the underlying cause of the neuropathy and taking medicines to help control pain. Physical therapy and assistive devices may also help. This information is not intended to replace advice given to you by your health care provider. Make sure you discuss any questions you have with your health care provider. Document Revised: 07/16/2021 Document Reviewed: 07/16/2021 Elsevier Patient Education  [place],   Merri Ray, MD [place], [facility] 08/25/22 4:37 PM

## 2022-08-25 NOTE — Patient Instructions (Addendum)
Nerve pain in feet may be due to Velcade. Keep follow up with Dr. Irene Limbo to discuss further, but can try gabapentin '100mg'$  at bedtime initially, then twice per day after 1 week if tolerated and still daytime symptoms. We have option to increase dose if needed. Keep me posted.   Thanks for coming in today.   Peripheral Neuropathy Peripheral neuropathy is a type of nerve damage. It affects nerves that carry signals between the spinal cord and the arms, legs, and the rest of the body (peripheral nerves). It does not affect nerves in the spinal cord or brain. In peripheral neuropathy, one nerve or a group of nerves may be damaged. Peripheral neuropathy is a broad category that includes many specific nerve disorders, like diabetic neuropathy, hereditary neuropathy, and carpal tunnel syndrome. What are the causes? This condition may be caused by: Certain diseases, such as: Diabetes. This is the most common cause of peripheral neuropathy. Autoimmune diseases, such as rheumatoid arthritis and systemic lupus erythematosus. Nerve diseases that are passed from parent to child (inherited). Kidney disease. Thyroid disease. Other causes may include: Nerve injury. Pressure or stress on a nerve that lasts a long time. Lack (deficiency) of B vitamins. This can result from alcoholism, poor diet, or a restricted diet. Infections. Some medicines, such as cancer medicines (chemotherapy). Poisonous (toxic) substances, such as lead and mercury. Too little blood flowing to the legs. In some cases, the cause of this condition is not known. What are the signs or symptoms? Symptoms of this condition depend on which of your nerves is damaged. Symptoms in the legs, hands, and arms can include: Loss of feeling (numbness) in the feet, hands, or both. Tingling in the feet, hands, or both. Burning pain. Very sensitive skin. Weakness. Not being able to move a part of the body (paralysis). Clumsiness or poor  coordination. Muscle twitching. Loss of balance. Symptoms in other parts of the body can include: Not being able to control your bladder. Feeling dizzy. Sexual problems. How is this diagnosed? Diagnosing and finding the cause of peripheral neuropathy can be difficult. Your health care provider will take your medical history and do a physical exam. A neurological exam will also be done. This involves checking things that are affected by your brain, spinal cord, and nerves (nervous system). For example, your health care provider will check your reflexes, how you move, and what you can feel. You may have other tests, such as: Blood tests. Electromyogram (EMG) and nerve conduction tests. These tests check nerve function and how well the nerves are controlling the muscles. Imaging tests, such as a CT scan or MRI, to rule out other causes of your symptoms. Removing a small piece of nerve to be examined in a lab (nerve biopsy). Removing and examining a small amount of the fluid that surrounds the brain and spinal cord (lumbar puncture). How is this treated? Treatment for this condition may involve: Treating the underlying cause of the neuropathy, such as diabetes, kidney disease, or vitamin deficiencies. Stopping medicines that can cause neuropathy, such as chemotherapy. Medicine to help relieve pain. Medicines may include: Prescription or over-the-counter pain medicine. Anti-seizure medicine. Antidepressants. Pain-relieving patches that are applied to painful areas of skin. Surgery to relieve pressure on a nerve or to destroy a nerve that is causing pain. Physical therapy to help improve movement and balance. Devices to help you move around (assistive devices). Follow these instructions at home: Medicines Take over-the-counter and prescription medicines only as told by your health care  provider. Do not take any other medicines without first asking your health care provider. Ask your health  care provider if the medicine prescribed to you requires you to avoid driving or using machinery. Lifestyle  Do not use any products that contain nicotine or tobacco. These products include cigarettes, chewing tobacco, and vaping devices, such as e-cigarettes. Smoking keeps blood from reaching damaged nerves. If you need help quitting, ask your health care provider. Avoid or limit alcohol. Too much alcohol can cause a vitamin B deficiency, and vitamin B is needed for healthy nerves. Eat a healthy diet. This includes: Eating foods that are high in fiber, such as beans, whole grains, and fresh fruits and vegetables. Limiting foods that are high in fat and processed sugars, such as fried or sweet foods. General instructions  If you have diabetes, work closely with your health care provider to keep your blood sugar under control. If you have numbness in your feet: Check every day for signs of injury or infection. Watch for redness, warmth, and swelling. Wear padded socks and comfortable shoes. These help protect your feet. Develop a good support system. Living with peripheral neuropathy can be stressful. Consider talking with a mental health specialist or joining a support group. Use assistive devices and attend physical therapy as told by your health care provider. This may include using a walker or a cane. Keep all follow-up visits. This is important. Where to find more information [employer] of Neurological Disorders: [employer]it Contact a health care provider if: You have new signs or symptoms of peripheral neuropathy. You are struggling emotionally from dealing with peripheral neuropathy. Your pain is not well controlled. Get help right away if: You have an injury or infection that is not healing normally. You develop new weakness in an arm or leg. You have fallen or do so frequently. Summary Peripheral neuropathy is when the nerves in the arms or legs are damaged,  resulting in numbness, weakness, or pain. There are many causes of peripheral neuropathy, including diabetes, pinched nerves, vitamin deficiencies, autoimmune disease, and hereditary conditions. Diagnosing and finding the cause of peripheral neuropathy can be difficult. Your health care provider will take your medical history, do a physical exam, and do tests, including blood tests and nerve function tests. Treatment involves treating the underlying cause of the neuropathy and taking medicines to help control pain. Physical therapy and assistive devices may also help. This information is not intended to replace advice given to you by your health care provider. Make sure you discuss any questions you have with your health care provider. Document Revised: 07/16/2021 Document Reviewed: 07/16/2021 Elsevier Patient Education  [place].

## 2022-08-26 NOTE — Therapy (Signed)
OUTPATIENT PHYSICAL THERAPY ONCOLOGY EVALUATION  Patient Name: Nathan Martinez MRN: [id number] DOB:[birth year], 76 y.o., male Today's Date: 08/27/2022   PT End of Session - 08/27/22 1053     Visit Number 1    Number of Visits 9    Date for PT Re-Evaluation 09/24/22    PT Start Time 0903    PT Stop Time 0950    PT Time Calculation (min) 47 min    Activity Tolerance Patient tolerated treatment well    Behavior During Therapy [facility] for tasks assessed/performed             Past Medical History:  Diagnosis Date   Hypertension    Past Surgical History:  Procedure Laterality Date   KNEE SURGERY Right 2018   PARATHYROID EXPLORATION     Patient Active Problem List   Diagnosis Date Noted   Multiple myeloma not having achieved remission ([place]) 04/16/2022   Multiple myeloma ([place]) 01/24/2022   Counseling regarding advance care planning and goals of care 01/24/2022    PCP: Merri Ray, MD  REFERRING PROVIDER: [name], MD  REFERRING DIAG: C90.01 (ICD-10-CM) - Multiple myeloma in remission ([place]) R53.83 (ICD-10-CM) - Fatigue, unspecified type   THERAPY DIAG:  Muscle weakness (generalized)  Other disturbances of skin sensation  Difficulty in walking, not elsewhere classified  Multiple myeloma in remission [facility])  ONSET DATE: 06/24/22  Rationale for Evaluation and Treatment Rehabilitation  SUBJECTIVE                                                                                                                                                                                           SUBJECTIVE STATEMENT: I felt more weak after completing the chemo in the past one or two months. I would stumble around due to the neuropathy. I have been having trouble lifting when gardening. I am only able to do a little bit of gardening before I get worn out. I have trouble walking around for a period of time. I wasn't out of breath, I was just exhausted.   PERTINENT HISTORY:   Recently diagnosed with acute DVT of the right proximal, mid and distal femoral vein, right popliteal vein and right peroneal veins.  Noted on ultrasound September 26 started on Eliquis. Active myeloma with presence of bone lesions (one on rib and one on vertebrae) on PET scan with recent remission. 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 history of hypertension, dyslipidemia, glaucoma, GERD, previous Helicobacter pylori infection with  PAIN:  Are you having pain?  No  PRECAUTIONS: Other: R TKA 2018, multiple myeloma in remission    WEIGHT BEARING RESTRICTIONS No  FALLS:  Has patient fallen in last 6 months? Yes. Number of falls turned quickly and stepped on a shoe and fell in the middle of the night when half asleep  LIVING ENVIRONMENT: Lives with: lives with their spouse [name] Lives in: House/apartment Stairs: Yes; their bedroom is on the main level, pt reports he occasionally uses the stairs as his gym is upstairs Has following equipment at home:  none  OCCUPATION: retired  LEISURE: pt reports he has not exercises much recently - less than once per week, pt was exercising 1-2 times per week prior to 3 months ago- has gym at home with stationary bike, weights, elliptical  HAND DOMINANCE : right   PRIOR LEVEL OF FUNCTION: Independent  PATIENT GOALS get stronger, get strength back   OBJECTIVE  COGNITION:  Overall cognitive status: Within functional limits for tasks assessed   SENSATION:  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, decreased sensation in bilateral great toe in bilateral feet to light touch, intact proprioception in great toe, no other area of impaired sensation  POSTURE: forward head and rounded shoulders   UPPER EXTREMITY STRENGTH:  grossly 5/5 in shoulder flexion, abduction, and biceps,  3+/5 in triceps   LOWER EXTREMITY STRENGTH:  STRENGTH Right eval  Hip flexion 4/5  Hip extension 4/5  Hip abduction 5/5  Hip adduction   Hip internal rotation   Hip external rotation   Knee flexion 5/5  Knee extension 5/5  Ankle dorsiflexion 5/5  Ankle plantarflexion   Ankle inversion   Ankle eversion    (Blank rows = not tested)  A/PROM LEFT eval  Hip flexion 5/5  Hip extension 4/5  Hip abduction 5/5  Hip adduction   Hip internal rotation   Hip external rotation   Knee flexion 4/5  Knee extension 5/5  Ankle dorsiflexion 5/5  Ankle plantarflexion   Ankle inversion   Ankle eversion    (Blank rows = not tested)  FUNCTIONAL TESTS:  30 seconds chair stand test: 16 which is between good and excellent for his age, pt had increased leg fatigue after this, he first did 11 but then felt he could have done them faster so retested SLS on R: 1st trial 1 sec, 2nd trial 2 sec, 3rd trial 4 sec SLS on L: 1st trial 2 sec, 2nd trial 3 sec, 3rd trial 2 sec  GAIT: Distance walked: 15 Assistive device utilized: None Level of assistance: Complete Independence Comments: some slight decreased foot clearance    TODAY'S TREATMENT  08/27/22- educated pt to begin using stationary bike and elliptical at home and slowly work up time doing cardio, start around 5 min; practice sit to stands for an exercise  PATIENT EDUCATION:  Education details: importance of daily exercise to decrease faitgue Person educated: Patient Education method: Explanation Education comprehension: verbalized understanding   HOME EXERCISE PROGRAM: Use treadmill/stationary bike at home - start with just a few min and work up Sit to stands  ASSESSMENT:  CLINICAL IMPRESSION: Patient is a 76 y.o. male who was seen today for physical therapy evaluation and treatment for weakness, CIPN, and fatigue following chemotherapy for treatment of myeloma. Pt reports in the last 1-2 months he has been having increased  fatigue and muscle weakness. He also reports issues with balance and did have one fall that happened in the night when he was half asleep and  got up to use the restroom. 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 Overall his strength is good between 4 and 5/5 though he reports not being able to carry as much when gardening and inability to walk far. His balance is impaired and his is unable to maintain SLS bilaterally for more than 2-3 sec. He would benefit from skilled PT services to improve high level balance, decrease symptoms of CIPN, improve strength and progress pt towards independence with a home exercise program.    OBJECTIVE IMPAIRMENTS decreased activity tolerance, decreased balance, decreased endurance, difficulty walking, decreased strength, impaired sensation, and postural dysfunction.   ACTIVITY LIMITATIONS carrying and lifting  PARTICIPATION LIMITATIONS: community activity and yard work  PERSONAL FACTORS  none  are also affecting patient's functional outcome.   REHAB POTENTIAL: Good  CLINICAL DECISION MAKING: Stable/uncomplicated  EVALUATION COMPLEXITY: Low  GOALS: Goals reviewed with patient? Yes  SHORT TERM GOALS=LONG TERM GOALS Target date: 09/24/2022    Pt will be able to do SLS for at least 15 sec bilaterally to allow him to don his pants with increased ease. Baseline: 2 sec bilat Goal status: INITIAL  2.  Pt will be independent in a home exercise program for continued stretching and strengthening.  Baseline:  Goal status: INITIAL  3.  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  Baseline:  Goal status: INITIAL  4.  Pt will improve R hip flexion strength to 5/5 to decrease fall risk. Baseline: 4/5 Goal status: INITIAL   PLAN: PT FREQUENCY: 2x/week  PT DURATION: 4 weeks  PLANNED INTERVENTIONS: Therapeutic exercises, Therapeutic activity, Neuromuscular  re-education, Balance training, Gait training, Patient/Family education, Self Care, and Manual therapy  PLAN FOR NEXT SESSION: begin high level balance exercises, use machines for strengthening   [employer], PT 08/27/2022, 10:55 AM

## 2022-08-27 ENCOUNTER — Other Ambulatory Visit: Payer: Self-pay

## 2022-08-27 ENCOUNTER — Encounter: Payer: Self-pay | Admitting: Physical Therapy

## 2022-08-27 ENCOUNTER — Ambulatory Visit: Payer: Medicare Other | Attending: Hematology | Admitting: Physical Therapy

## 2022-08-27 DIAGNOSIS — R5383 Other fatigue: Secondary | ICD-10-CM | POA: Insufficient documentation

## 2022-08-27 DIAGNOSIS — R208 Other disturbances of skin sensation: Secondary | ICD-10-CM

## 2022-08-27 DIAGNOSIS — C9001 Multiple myeloma in remission: Secondary | ICD-10-CM | POA: Insufficient documentation

## 2022-08-27 DIAGNOSIS — M6281 Muscle weakness (generalized): Secondary | ICD-10-CM | POA: Insufficient documentation

## 2022-08-27 DIAGNOSIS — R262 Difficulty in walking, not elsewhere classified: Secondary | ICD-10-CM

## 2022-08-28 ENCOUNTER — Other Ambulatory Visit: Payer: Self-pay | Admitting: Hematology

## 2022-08-28 ENCOUNTER — Ambulatory Visit: Payer: Medicare Other | Admitting: Family Medicine

## 2022-08-28 DIAGNOSIS — C9000 Multiple myeloma not having achieved remission: Secondary | ICD-10-CM

## 2022-08-28 DIAGNOSIS — Z7189 Other specified counseling: Secondary | ICD-10-CM

## 2022-08-29 ENCOUNTER — Inpatient Hospital Stay: Payer: Medicare Other | Attending: Hematology | Admitting: Hematology

## 2022-08-29 DIAGNOSIS — C9001 Multiple myeloma in remission: Secondary | ICD-10-CM | POA: Diagnosis not present

## 2022-08-29 DIAGNOSIS — I82411 Acute embolism and thrombosis of right femoral vein: Secondary | ICD-10-CM | POA: Diagnosis not present

## 2022-08-29 MED ORDER — APIXABAN 5 MG PO TABS: 5.0000 mg | ORAL_TABLET | Freq: Two times a day (BID) | ORAL | 5 refills | Status: DC

## 2022-09-01 ENCOUNTER — Ambulatory Visit: Payer: Medicare Other

## 2022-09-01 DIAGNOSIS — M6281 Muscle weakness (generalized): Secondary | ICD-10-CM

## 2022-09-01 DIAGNOSIS — C9001 Multiple myeloma in remission: Secondary | ICD-10-CM | POA: Diagnosis not present

## 2022-09-01 DIAGNOSIS — R262 Difficulty in walking, not elsewhere classified: Secondary | ICD-10-CM

## 2022-09-01 DIAGNOSIS — R208 Other disturbances of skin sensation: Secondary | ICD-10-CM

## 2022-09-01 NOTE — Progress Notes (Incomplete)
Nathan Martinez   HEMATOLOGY/ONCOLOGY CLINIC NOTE   Date of Service: 08/29/2022  Patient Care Team: [name], MD as PCP - General (Family Medicine) [name], MD as Consulting Physician (Hematology)  CHIEF COMPLAINTS/PURPOSE OF CONSULTATION:  Follow-up for continued evaluation and management of multiple myeloma  HISTORY OF PRESENTING ILLNESS:  Please see previous note for details of initial presentation  INTERVAL HISTORY [name] is a 76 y.o. male here for continued evaluation and management of multiple myeloma. He reports He is doing well with no new symptoms or concerns.  He notes that  Due to recent acute DVT as noted in his previously discussed [place] lower extremities done 08/19/2022, his Revlimid and ASA have been stopped and he has been put on Eliquis 5 mg 2x p.o daily.  No fevers no chills no night sweats.  No significant new fatigue. No loose stools or change in bowel habits. No other new or acute focal symptoms.  No other notable toxicities from his current treatment.    Labs done today were reviewed in detail with the patient.  MEDICAL HISTORY:  Intermittent hypercalcemia - primary hyperparathyroidism status post parathyroidectomy in 2019 HTN HLD Glaucoma History of Helicobacter pylori infection previously treated Chronic leukopenia/neutropenia History of present iron deficiency anemia  SURGICAL HISTORY: Rt TKA 2018 Parathyroidectomy 2019 B/l cataracts sx 2021  SOCIAL HISTORY: Social History   Socioeconomic History  . Marital status: Married    Spouse name: Not on file  . Number of children: Not on file  . Years of education: Not on file  . Highest education level: Not on file  Occupational History  . Not on file  Tobacco Use  . Smoking status: Never  . Smokeless tobacco: Never  Vaping Use  . Vaping Use: Never used  Substance and Sexual Activity  . Alcohol use: Not Currently  . Drug use: Never  . Sexual activity: Not Currently    Birth  control/protection: None  Other Topics Concern  . Not on file  Social History Narrative  . 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  MEDICATIONS:  Krill oil Olmesartan hydrochlorothiazide 20-12.5 mg Ferrex 150 mg p.o. daily Aspirin 81 mg p.o. daily Atorvastatin 20 mg p.o. daily Multivitamin 1 tablet p.o. daily Ergocalciferol 50,000 units once weekly   REVIEW OF SYSTEMS:   10 Point review of Systems was done is negative except as noted above.  PHYSICAL EXAMINATION: .There were no vitals taken for this visit. NAD GENERAL:alert, in no acute distress and comfortable SKIN: no acute rashes, no significant lesions EYES: conjunctiva are pink and non-injected, sclera anicteric NECK: supple, no JVD LYMPH:  no palpable lymphadenopathy in the cervical, axillary or inguinal regions LUNGS: clear to auscultation b/l with normal respiratory effort HEART: regular rate & rhythm ABDOMEN:  normoactive bowel sounds , non tender, not distended. Extremity: no pedal edema PSYCH: alert & oriented x 3 with fluent speech NEURO: no focal motor/sensory deficits  LABORATORY DATA:  I have reviewed the data as listed  .  Latest Ref Rng & Units 08/15/2022   10:54 AM 08/11/2022    8:07 AM 07/25/2022    2:24 PM  CBC  WBC 4.0 - 10.5 K/uL 4.1  5.1  4.5   Hemoglobin 13.0 - 17.0 g/dL 13.2  13.0  12.2   Hematocrit 39.0 - 52.0 % 39.7  40.0  36.1   Platelets 150 - 400 K/uL 128  143  144    CBC    Component Value Date/Time   WBC 4.1 08/15/2022  1054   WBC 5.1 08/11/2022 0807   RBC 4.74 08/15/2022 1054   HGB 13.2 08/15/2022 1054   HCT 39.7 08/15/2022 1054   PLT 128 (L) 08/15/2022 1054   MCV 83.8 08/15/2022 1054   MCH 27.8 08/15/2022 1054   MCHC 33.2 08/15/2022 1054   RDW 18.2 (H) 08/15/2022 1054   LYMPHSABS 0.5 (L) 08/15/2022 1054   MONOABS 0.8 08/15/2022 1054   EOSABS 0.1 08/15/2022 1054   BASOSABS 0.0 08/15/2022 1054    .    Latest Ref Rng & Units 08/15/2022   10:54 AM 07/25/2022    2:24 PM 07/18/2022    1:38 PM  CMP  Glucose 70 - 99 mg/dL 95  99  92   BUN 8 - 23 mg/dL 14  15  14    Creatinine 0.61 - 1.24 mg/dL 1.16  1.07  0.92   Sodium 135 - 145 mmol/L 139  141  138   Potassium 3.5 - 5.1 mmol/L 3.9  3.9  4.4   Chloride 98 - 111 mmol/L 105  109  107   CO2 22 - 32 mmol/L 33  29  29   Calcium 8.9 - 10.3 mg/dL 9.7  9.7  9.8   Total Protein 6.5 - 8.1 g/dL 6.2  5.7  5.8   Total Bilirubin 0.3 - 1.2 mg/dL 1.0  0.8  0.9   Alkaline Phos 38 - 126 U/L 38  35  36   AST 15 - 41 U/L 36  21  20   ALT 0 - 44 U/L 49  27  29      IEOP/Immunofixation Order: [id number] Component 9 d ago  PATHOLOGIST CHARGE ID  [id number] - Jamison Neighbor, M.D.   SERUM IEOP COMMENT   NO DEFINITE CLONALITY DETECTED.   Reviewed and Interpreted By   Jamison Neighbor, M.D.       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  3) Acute DVT - -  PLAN -Labs done today were reviewed in detail with the patient as noted  above CBC stable wih Hgb of 13.2, WBC count of 4.1k, and platelets of 128k. CMP unremarkable. MM panel done 08/15/2022 with no M spike and neg IFE. -Patient notes no notable toxicities from his current RVD regimen. Orders reviewed and placed. -conitnue same supportive medications. -Continue Revlimid switching to maintenance @ 18m3w on 1wk off -Continue monthly Zometa -Continue vitamin D at least 2000 units daily. -We discussed trying Marinol for a month to see if appetite improves. [facility]  Dr Aris Lot the patient chooses to hold off on Auto HSCT and preferrs  4) leg swelling due to steroids -patient using compression socks -Continue low dose Lasix. He can reduce taking Lasix to 3 days and if leg swelling continues to be well managed then he can stop it completely and only take as needed. -We will start him on Maintenance Revlimid.32  #3 Stye -warm water soaks Eythromycin ointment -continue f/u with ophthamoloy  Follow-up Return to clinic with Dr. Irene Limbo with labs and next dose of Zometa in 6 weeks  The total time spent in the appointment was *** minutes*.  All of the patient's questions were answered with apparent satisfaction. The patient knows to call the clinic with any problems, questions or concerns.   Sullivan Lone MD MS AAHIVMS [facility] Hematology/Oncology Physician [facility]  .*Total Encounter Time as defined by the Centers for Medicare and Medicaid Services includes, in addition to the face-to-face time of a patient visit (documented in the note above) non-face-to-face time: obtaining and reviewing outside history, ordering and reviewing medications, tests or procedures, care coordination (communications with other health care professionals or caregivers) and documentation in the medical record.  Ralene Bathe, am acting as scribe for Dr. Sullivan Lone, MD.   US venous shows RLE VTE showed rt femoral vein  DVT -recommended to hold Revlimid AND asa asap. -Eliquis stand to his pharma list

## 2022-09-01 NOTE — Progress Notes (Signed)
[name]   HEMATOLOGY/ONCOLOGY PHONE VISIT NOTE   Date of Service: 08/29/2022  Patient Care Team: [name], MD as PCP - General (Family Medicine) [name], MD as Consulting Physician (Hematology)  CHIEF COMPLAINTS/PURPOSE OF CONSULTATION:  Follow-up for continued evaluation and management of multiple myeloma  HISTORY OF PRESENTING ILLNESS:  Please see previous note for details of initial presentation  INTERVAL HISTORY I connected with [name] on 08/29/2022 at 8:40 AM EST by telephone visit and verified that I am speaking with the correct person using two identifiers.   I discussed the limitations, risks, security and privacy concerns of performing an evaluation and management service by telemedicine and the availability of in-person appointments. I also discussed with the patient that there may be a patient responsible charge related to this service. The patient expressed understanding and agreed to proceed.   Other persons participating in the visit and their role in the encounter: None  Patient's location: Home Provider's location: [facility]  Nathan Martinez is a 76 y.o. male was contacted via phone for continued evaluation and management of multiple myeloma.   He notes that he has started outpatient rehab and says that it has been helpful so far.  Due to recent acute DVT as noted in his previously discussed [place] lower extremities done 08/19/2022, his Revlimid and ASA have been stopped and he has been put on Eliquis 5 mg 2x p.o daily. He notes he is tolerating this well with no prohibitive toxicities at this time.  He notes that his right leg swelling has significantly improved since he has been on the Eliquis and is nearly resolved.  No leg pain or discomfort.  Swelling does not get worse with ambulation or physical activity.  No change in the color in the right lower extremity or significant cramping.  We discussed pros and cons of restarting  Revlimid versus switching to alternative maintenance regimen.  We decided to restart maintenance Revlimid with the plan to continue long-term anticoagulation while on Revlimid to treat acute DVT and for secondary thromboprophylaxis as well.  No fevers no chills no night sweats.  No significant new fatigue. No loose stools or change in bowel habits. No other new or acute focal symptoms.  No other notable toxicities from his current treatment.   Last myeloma labs from 08/15/2022 showed absence of M protein.  MEDICAL HISTORY:  Intermittent hypercalcemia - primary hyperparathyroidism status post parathyroidectomy in 2019 HTN HLD Glaucoma History of Helicobacter pylori infection previously treated Chronic leukopenia/neutropenia History of present iron deficiency anemia Acute DVT  SURGICAL HISTORY: Rt TKA 2018 Parathyroidectomy 2019 B/l cataracts sx 2021  SOCIAL HISTORY: Social History   Socioeconomic History   Marital status: Married    Spouse name: Not on file   Number of children: Not on file   Years of education: Not on file   Highest education level: Not on file  Occupational History   Not on file  Tobacco Use   Smoking status: Never   Smokeless tobacco: Never  Vaping Use   Vaping Use: Never used  Substance and Sexual Activity   Alcohol use: Not Currently   Drug use: Never   Sexual activity: Not Currently    Birth control/protection: None  Other Topics Concern   Not on file  Social History Narrative   Not on file   Social Determinants of Health   Financial Resource Strain: Not on file  Food Insecurity: Not on file  Transportation Needs: Not on  file  Physical Activity: Not on file  Stress: Not on file  Social Connections: Not on file  Intimate Partner Violence: Not on file  Ex smoker (quit 1976) 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  MEDICATIONS:  Current Outpatient Medications on File Prior to Visit  Medication Sig Dispense Refill   acyclovir (ZOVIRAX) 400 MG tablet Take 1 tablet by mouth twice daily 60 tablet 0   atorvastatin (LIPITOR) 20 MG tablet Take 1 tablet (20 mg total) by mouth at bedtime. 90 tablet 1   ergocalciferol (VITAMIN D2) 1.25 MG (50000 UT) capsule Take 1 capsule (50,000 Units total) by mouth once a week. 30 capsule 0   erythromycin ophthalmic ointment Place 1 Application into both eyes in the morning and at bedtime. 3.5 g 1   gabapentin (NEURONTIN) 100 MG capsule Take 1 capsule (100 mg total) by mouth 2 (two) times daily. Start 1 at bedtime for first week, then increase if tolerated. 60 capsule 1   iron polysaccharides (NIFEREX) 150 MG capsule Take 1 capsule (150 mg total) by mouth daily. 90 capsule 3   Krill Oil (OMEGA-3) 500 MG CAPS      latanoprost (XALATAN) 0.005 % ophthalmic solution SMARTSIG:1 Drop(s) In Eye(s) Every Evening     lenalidomide (REVLIMID) 10 MG capsule Take 1 capsule (10 mg total) by mouth daily. Take 1 capsule (10 mg total) daily by mouth for 21 days then take 7 days off. 21 capsule 0   loratadine (CLARITIN) 10 MG tablet Take 10 mg by mouth daily.     Magnesium Oxide 400 MG CAPS      Multiple Vitamins-Minerals (MULTIVITAMIN MEN 50+) TABS Take by mouth.     olmesartan (BENICAR) 20 MG tablet Take 1 tablet (20 mg total) by mouth daily. 90 tablet 1   Olopatadine HCl 0.2 % SOLN Apply 1 drop to eye daily. 2.5 mL 1   ondansetron (ZOFRAN) 8 MG tablet Take 1 tablet (8 mg total) by mouth 2 (two) times daily as needed (Nausea or vomiting). (Patient not taking: Reported on 08/25/2022) 30 tablet 1   prochlorperazine (COMPAZINE) 10 MG tablet Take 1 tablet (10 mg total) by mouth every 6 (six) hours as needed (Nausea or vomiting). (Patient not taking: Reported on 08/25/2022) 30 tablet 1   No current facility-administered medications on file prior to  visit.     REVIEW OF SYSTEMS:   10 Point review of Systems was done is negative except as noted above.  PHYSICAL EXAMINATION: Telemedicine appointment  LABORATORY DATA:  I have reviewed the data as listed  .    Latest Ref Rng & Units 08/15/2022   10:54 AM 08/11/2022    8:07 AM 07/25/2022    2:24 PM  CBC  WBC 4.0 - 10.5 K/uL 4.1  5.1  4.5   Hemoglobin 13.0 - 17.0 g/dL 13.2  13.0  12.2   Hematocrit 39.0 - 52.0 % 39.7  40.0  36.1   Platelets 150 - 400 K/uL 128  143  144    CBC    Component Value Date/Time   WBC 4.1 08/15/2022 1054   WBC 5.1 08/11/2022 0807   RBC 4.74 08/15/2022 1054   HGB 13.2 08/15/2022 1054   HCT 39.7 08/15/2022 1054   PLT 128 (L) 08/15/2022 1054   MCV 83.8 08/15/2022 1054   MCH  27.8 08/15/2022 1054   MCHC 33.2 08/15/2022 1054   RDW 18.2 (H) 08/15/2022 1054   LYMPHSABS 0.5 (L) 08/15/2022 1054   MONOABS 0.8 08/15/2022 1054   EOSABS 0.1 08/15/2022 1054   BASOSABS 0.0 08/15/2022 1054    .    Latest Ref Rng & Units 08/15/2022   10:54 AM 07/25/2022    2:24 PM 07/18/2022    1:38 PM  CMP  Glucose 70 - 99 mg/dL 95  99  92   BUN 8 - 23 mg/dL 14  15  14    Creatinine 0.61 - 1.24 mg/dL 1.16  1.07  0.92   Sodium 135 - 145 mmol/L 139  141  138   Potassium 3.5 - 5.1 mmol/L 3.9  3.9  4.4   Chloride 98 - 111 mmol/L 105  109  107   CO2 22 - 32 mmol/L 33  29  29   Calcium 8.9 - 10.3 mg/dL 9.7  9.7  9.8   Total Protein 6.5 - 8.1 g/dL 6.2  5.7  5.8   Total Bilirubin 0.3 - 1.2 mg/dL 1.0  0.8  0.9   Alkaline Phos 38 - 126 U/L 38  35  36   AST 15 - 41 U/L 36  21  20   ALT 0 - 44 U/L 49  27  29      IEOP/Immunofixation Order: [id number] Component 9 d ago  PATHOLOGIST CHARGE ID  [id number] - Jamison Neighbor, M.D.   SERUM IEOP COMMENT   NO DEFINITE CLONALITY DETECTED.   Reviewed and Interpreted By   Jamison Neighbor, M.D.       RADIOGRAPHIC STUDIES:   [place] lower extremity venous ([id number]) done 08/19/2022 revealed "Summary:  RIGHT:  -  Findings consistent with acute deep vein thrombosis involving the right  proximal, mid, and distal femoral vein, right popliteal vein, and right  peroneal veins.  - No cystic structure found in the popliteal fossa.     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 Most recent PET restaging scan done 08/13/2022 showed no overt signs of disease progression. Myeloma FISH panel no reported mutations. Cytogenetics-loss of Y chromosome.  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 Recent labs have shown resolution of his leukopenia/neutropenia.  We will continue to monitor on Revlimid.  3) Acute DVT of the right lower extremity Ultrasound right lower extremity 08/19/2022- Findings consistent with acute deep vein thrombosis involving the right  proximal, mid, and distal femoral vein, right popliteal vein, and right  peroneal veins.  - No cystic structure found in the popliteal fossa.  PLAN -Continue taking Eliquis 5 mg 2x p.o daily.  -Patient's Revlimid has been held for the last 2 to 3 weeks. -We discussed the pros and cons of different treatment approaches for maintenance treatment at this time and decided to restart Revlimid maintenance at 10 mg p.o. 3 weeks on 1 week off. -He understands that he will need to continue to be on therapeutic Eliquis for treatment of his acute DVT as well as for secondary thromboprophylaxis while on Revlimid. -We  discussed using compression socks and staying  active. -Avoiding dehydration and drinking at least 2 L of water daily. -He will switch to maintenance Zometa every 8 weeks  4) history of stye stye -continue f/u with ophthamology Currently not an acute issue and has resolved since the patient has been off Velcade.   Follow-up Return to clinic with Dr. Irene Limbo with labs and next dose of Zometa in 6 weeks  The total time spent in the appointment was 21 minutes*.  All of the patient's questions were answered with apparent satisfaction. The patient knows to call the clinic with any problems, questions or concerns.   Sullivan Lone MD MS AAHIVMS [facility] Hematology/Oncology Physician [facility]  .*Total Encounter Time as defined by the Centers for Medicare and Medicaid Services includes, in addition to the face-to-face time of a patient visit (documented in the note above) non-face-to-face time: obtaining and reviewing outside history, ordering and reviewing medications, tests or procedures, care coordination (communications with other health care professionals or caregivers) and documentation in the medical record.  I, Melene Muller, am acting as scribe for Dr. Sullivan Lone, MD.  .I have reviewed the above documentation for accuracy and completeness, and I agree with the above. [name] MD

## 2022-09-01 NOTE — Therapy (Signed)
OUTPATIENT PHYSICAL THERAPY ONCOLOGY TREATMENT   Patient Name: Nathan Martinez MRN: [id number] DOB:11/25/1945, 76 y.o., male Today's Date: 09/01/2022   PT End of Session - 09/01/22 1113     Visit Number 2    Number of Visits 9    Date for PT Re-Evaluation 09/24/22    PT Start Time 1105    PT Stop Time 1158    PT Time Calculation (min) 53 min    Activity Tolerance Patient tolerated treatment well    Behavior During Therapy [facility] for tasks assessed/performed             Past Medical History:  Diagnosis Date   Hypertension    Past Surgical History:  Procedure Laterality Date   KNEE SURGERY Right 2018   PARATHYROID EXPLORATION     Patient Active Problem List   Diagnosis Date Noted   Multiple myeloma not having achieved remission ([place]) 04/16/2022   Multiple myeloma ([place]) 01/24/2022   Counseling regarding advance care planning and goals of care 01/24/2022    PCP: Nathan Ray, MD  REFERRING PROVIDER: [name], MD  REFERRING DIAG: C90.01 (ICD-10-CM) - Multiple myeloma in remission ([place]) R53.83 (ICD-10-CM) - Fatigue, unspecified type   THERAPY DIAG:  Muscle weakness (generalized)  Other disturbances of skin sensation  Difficulty in walking, not elsewhere classified  Multiple myeloma in remission [facility])  ONSET DATE: 06/24/22  Rationale for Evaluation and Treatment Rehabilitation  SUBJECTIVE                                                                                                                                                                                           SUBJECTIVE STATEMENT: I played 18 holes of golf yesterday. I rode in the cart but did try to walk as much as I could. The uneven terrain was a challenge. I was fatigued after that.   PERTINENT HISTORY:  Recently diagnosed with acute DVT of the right proximal, mid and distal femoral vein, right popliteal vein and right peroneal veins.  Noted on ultrasound September 26 started on Eliquis.  Active myeloma with presence of bone lesions (one on rib and one on vertebrae) on PET scan with recent remission. 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 history of hypertension, dyslipidemia, glaucoma, GERD, previous Helicobacter pylori infection with  PAIN:  Are you having pain? No  PRECAUTIONS: Other: R TKA 2018, multiple myeloma in remission    WEIGHT BEARING RESTRICTIONS No  FALLS:  Has patient fallen in last 6 months? Yes. Number of falls  turned quickly and stepped on a shoe and fell in the middle of the night when half asleep  LIVING ENVIRONMENT: Lives with: lives with their spouse Nathan Martinez Lives in: House/apartment Stairs: Yes; their bedroom is on the main level, pt reports he occasionally uses the stairs as his gym is upstairs Has following equipment at home:  none  OCCUPATION: retired  LEISURE: pt reports he has not exercises much recently - less than once per week, pt was exercising 1-2 times per week prior to 3 months ago- has gym at home with stationary bike, weights, elliptical  HAND DOMINANCE : right   PRIOR LEVEL OF FUNCTION: Independent  PATIENT GOALS get stronger, get strength back   OBJECTIVE  COGNITION:  Overall cognitive status: Within functional limits for tasks assessed   SENSATION:  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, decreased sensation in bilateral great toe in bilateral feet to light touch, intact proprioception in great toe, no other area of impaired sensation  POSTURE: forward head and rounded shoulders   UPPER EXTREMITY STRENGTH:  grossly 5/5 in shoulder flexion, abduction, and biceps, 3+/5 in triceps   LOWER EXTREMITY STRENGTH:  STRENGTH Right eval  Hip flexion 4/5  Hip extension 4/5  Hip abduction 5/5  Hip adduction   Hip internal rotation   Hip external  rotation   Knee flexion 5/5  Knee extension 5/5  Ankle dorsiflexion 5/5  Ankle plantarflexion   Ankle inversion   Ankle eversion    (Blank rows = not tested)  A/PROM LEFT eval  Hip flexion 5/5  Hip extension 4/5  Hip abduction 5/5  Hip adduction   Hip internal rotation   Hip external rotation   Knee flexion 4/5  Knee extension 5/5  Ankle dorsiflexion 5/5  Ankle plantarflexion   Ankle inversion   Ankle eversion    (Blank rows = not tested)  FUNCTIONAL TESTS:  30 seconds chair stand test: 16 which is between good and excellent for his age, pt had increased leg fatigue after this, he first did 11 but then felt he could have done them faster so retested SLS on R: 1st trial 1 sec, 2nd trial 2 sec, 3rd trial 4 sec SLS on L: 1st trial 2 sec, 2nd trial 3 sec, 3rd trial 2 sec  GAIT: Distance walked: 15 Assistive device utilized: None Level of assistance: Complete Independence Comments: some slight decreased foot clearance    TODAY'S TREATMENT  09/01/22- Therapeutic Exercises: NuStep level 5 x10 mins with therapist monitoring pt throughout for fatigue Supine on mat table (after neuro re ed) for pelvic tilt then with pelvic tilt for following: clams (open/close knees) 2 x 5, alt marching 2 x 5, then LAQ's 2 x 5 returning therapist demo for each  Reverse curl x10 with demo Leg Press Machine 80# 2 x 15, VCs to keep feet in neutral and shoulder width apart, also to push thru heels, not toes Seated EOB for alt LAQs 10# x10 each with cuing to hold 2-3 sec per rep Neuro Re-ed In // bars: Heel-toe front and retro x4 each, slow and controlled high knee marching x4 (pt struggled with posterior lean); grapevine 2x each direction returning therapist demo for each and VCs throughout for erect posture; seated rest Then stepping over cones 4x with cues for high knees and working to decrease UE support throughout Standing in corner on [place] for purple ball throw multi directional x1  min   08/27/22- educated pt to begin using stationary  bike and elliptical at home and slowly work up time doing cardio, start around 5 min; practice sit to stands for an exercise  PATIENT EDUCATION:  Education details: importance of daily exercise to decrease faitgue Person educated: Patient Education method: Explanation Education comprehension: verbalized understanding   HOME EXERCISE PROGRAM: Use treadmill/stationary bike at home - start with just a few min and work up Sit to stands  ASSESSMENT:  CLINICAL IMPRESSION: First session of bil LE strength and high level balance activities. Pt was challenged by these and has a tendency to lean posteriorly with stepping over activities but was able to correct this when cued. He reports his low back at times bothers him and this seems to be from when he leans. Incorporated core strength as well and bil LE flexibility.    OBJECTIVE IMPAIRMENTS decreased activity tolerance, decreased balance, decreased endurance, difficulty walking, decreased strength, impaired sensation, and postural dysfunction.   ACTIVITY LIMITATIONS carrying and lifting  PARTICIPATION LIMITATIONS: community activity and yard work  PERSONAL FACTORS  none  are also affecting patient's functional outcome.   REHAB POTENTIAL: Good  CLINICAL DECISION MAKING: Stable/uncomplicated  EVALUATION COMPLEXITY: Low  GOALS: Goals reviewed with patient? Yes  SHORT TERM GOALS=LONG TERM GOALS Target date: 09/24/2022    Pt will be able to do SLS for at least 15 sec bilaterally to allow him to don his pants with increased ease. Baseline: 2 sec bilat Goal status: INITIAL  2.  Pt will be independent in a home exercise program for continued stretching and strengthening.  Baseline:  Goal status: INITIAL  3.  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  Baseline:  Goal status: INITIAL  4.  Pt will improve R hip flexion  strength to 5/5 to decrease fall risk. Baseline: 4/5 Goal status: INITIAL   PLAN: PT FREQUENCY: 2x/week  PT DURATION: 4 weeks  PLANNED INTERVENTIONS: Therapeutic exercises, Therapeutic activity, Neuromuscular re-education, Balance training, Gait training, Patient/Family education, Self Care, and Manual therapy  PLAN FOR NEXT SESSION: Cont high level balance exercises and machines for strengthening   Otelia Limes, PTA 09/01/2022, 12:03 PM

## 2022-09-03 ENCOUNTER — Ambulatory Visit: Payer: Medicare Other

## 2022-09-03 DIAGNOSIS — R262 Difficulty in walking, not elsewhere classified: Secondary | ICD-10-CM

## 2022-09-03 DIAGNOSIS — R208 Other disturbances of skin sensation: Secondary | ICD-10-CM

## 2022-09-03 DIAGNOSIS — M6281 Muscle weakness (generalized): Secondary | ICD-10-CM

## 2022-09-03 DIAGNOSIS — C9001 Multiple myeloma in remission: Secondary | ICD-10-CM | POA: Diagnosis not present

## 2022-09-03 NOTE — Therapy (Addendum)
OUTPATIENT PHYSICAL THERAPY ONCOLOGY TREATMENT   Patient Name: Nathan Martinez MRN: [id number] DOB:1946-04-08, 76 y.o., male Today's Date: 09/03/2022   PT End of Session - 09/03/22 1114     Visit Number 3    Number of Visits 9    Date for PT Re-Evaluation 09/24/22    PT Start Time 1111    PT Stop Time 1207    PT Time Calculation (min) 56 min    Activity Tolerance Patient tolerated treatment well    Behavior During Therapy [facility] for tasks assessed/performed             Past Medical History:  Diagnosis Date   Hypertension    Past Surgical History:  Procedure Laterality Date   KNEE SURGERY Right 2018   PARATHYROID EXPLORATION     Patient Active Problem List   Diagnosis Date Noted   Multiple myeloma not having achieved remission ([place]) 04/16/2022   Multiple myeloma ([place]) 01/24/2022   Counseling regarding advance care planning and goals of care 01/24/2022    PCP: Nathan Ray, MD  REFERRING PROVIDER: [name], MD  REFERRING DIAG: C90.01 (ICD-10-CM) - Multiple myeloma in remission ([place]) R53.83 (ICD-10-CM) - Fatigue, unspecified type   THERAPY DIAG:  Muscle weakness (generalized)  Other disturbances of skin sensation  Difficulty in walking, not elsewhere classified  Multiple myeloma in remission [facility])  ONSET DATE: 06/24/22  Rationale for Evaluation and Treatment Rehabilitation  SUBJECTIVE                                                                                                                                                                                           SUBJECTIVE STATEMENT: I actually felt pretty good after the first session. I wasn't fatigued and my soreness wasn't bad at all. After I got home later in the afternoon I got on my stationary bike and rode for about 30 mins. The exercises really seemed to help.   PERTINENT HISTORY:  Recently diagnosed with acute DVT of the right proximal, mid and distal femoral vein, right popliteal vein  and right peroneal veins.  Noted on ultrasound September 26 started on Eliquis. Active myeloma with presence of bone lesions (one on rib and one on vertebrae) on PET scan with recent remission. 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 history of hypertension, dyslipidemia, glaucoma, GERD, previous Helicobacter pylori infection with  PAIN:  Are you having pain? No  PRECAUTIONS: Other: R TKA 2018, multiple myeloma in remission    WEIGHT BEARING RESTRICTIONS No  FALLS:  Has patient fallen in last 6 months? Yes. Number of falls turned quickly and stepped on a shoe and fell in the middle of the night when half asleep  LIVING ENVIRONMENT: Lives with: lives with their spouse Nathan Martinez Lives in: House/apartment Stairs: Yes; their bedroom is on the main level, pt reports he occasionally uses the stairs as his gym is upstairs Has following equipment at home:  none  OCCUPATION: retired  LEISURE: pt reports he has not exercises much recently - less than once per week, pt was exercising 1-2 times per week prior to 3 months ago- has gym at home with stationary bike, weights, elliptical  HAND DOMINANCE : right   PRIOR LEVEL OF FUNCTION: Independent  PATIENT GOALS get stronger, get strength back   OBJECTIVE  COGNITION:  Overall cognitive status: Within functional limits for tasks assessed   SENSATION:  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, decreased sensation in bilateral great toe in bilateral feet to light touch, intact proprioception in great toe, no other area of impaired sensation  POSTURE: forward head and rounded shoulders   UPPER EXTREMITY STRENGTH:  grossly 5/5 in shoulder flexion, abduction, and biceps, 3+/5 in triceps   LOWER EXTREMITY STRENGTH:  STRENGTH Right eval  Hip flexion 4/5  Hip extension 4/5   Hip abduction 5/5  Hip adduction   Hip internal rotation   Hip external rotation   Knee flexion 5/5  Knee extension 5/5  Ankle dorsiflexion 5/5  Ankle plantarflexion   Ankle inversion   Ankle eversion    (Blank rows = not tested)  A/PROM LEFT eval  Hip flexion 5/5  Hip extension 4/5  Hip abduction 5/5  Hip adduction   Hip internal rotation   Hip external rotation   Knee flexion 4/5  Knee extension 5/5  Ankle dorsiflexion 5/5  Ankle plantarflexion   Ankle inversion   Ankle eversion    (Blank rows = not tested)  FUNCTIONAL TESTS:  30 seconds chair stand test: 16 which is between good and excellent for his age, pt had increased leg fatigue after this, he first did 11 but then felt he could have done them faster so retested SLS on R: 1st trial 1 sec, 2nd trial 2 sec, 3rd trial 4 sec SLS on L: 1st trial 2 sec, 2nd trial 3 sec, 3rd trial 2 sec  GAIT: Distance walked: 15 Assistive device utilized: None Level of assistance: Complete Independence Comments: some slight decreased foot clearance    TODAY'S TREATMENT  09/03/22 Therapeutic Exercises: NuStep level 6 x10 mins with therapist monitoring pt throughout for fatigue (seat 12, UE 11) Then after Neuro Re ed Seated edge of mat table for bil HS stretch and bil piriformis figure 4 stretch 2x, 20-30 sec holds; then standing at side of // bars for quad stretch by grabbing ankle behind him at glut 2x, 20 sec each returning therapist demo Supine on mat table for pelvic tilt then with pelvic tilt for following: clams (open/close knees) 2 x 5, 3# on each ankle for following: alt marching 2 x 5, then LAQ's 2 x 5 returning therapist demo for each  Abdominal Reverse curl x10 Leg Press Machine 90# 2 x 15, pt with improved technique today Seated EOB for alt LAQs 10# 2x5 each, 2-3 sec holds, pt reports feeling LE fatigue as this was last exercise Neuro Re-ed In // bars with 3# added to ankles: Heel-toe front 4x and retro 2x each, slow  and controlled high knee  marching x4; grapevine 2x each direction, VCs to completely cross over midline; next standing on blue oval for bil LE 3 way raises into flex, ext, and abd x12 each returning therapist demo; tactile cues to prevent forward trunk lean with ext Then stepping over cones 4x with cues for high knees and working to decrease UE support throughout   09/01/22 Therapeutic Exercises: NuStep level 5 x10 mins with therapist monitoring pt throughout for fatigue Supine on mat table (after neuro re ed) for pelvic tilt then with pelvic tilt for following: clams (open/close knees) 2 x 5, alt marching 2 x 5, then LAQ's 2 x 5 returning therapist demo for each  Reverse curl x10 with demo Leg Press Machine 80# 2 x 15, VCs to keep feet in neutral and shoulder width apart, also to push thru heels, not toes Seated EOB for alt LAQs 10# x10 each with cuing to hold 2-3 sec per rep Neuro Re-ed In // bars: Heel-toe front and retro x4 each, slow and controlled high knee marching x4 (pt struggled with posterior lean); grapevine 2x each direction returning therapist demo for each and VCs throughout for erect posture; seated rest Then stepping over cones 4x with cues for high knees and working to decrease UE support throughout Standing in corner on [address] for purple ball throw multi directional x1 min   08/27/22- educated pt to begin using stationary bike and elliptical at home and slowly work up time doing cardio, start around 5 min; practice sit to stands for an exercise  PATIENT EDUCATION:  Education details: Bil LE hip and core strength Person educated: Patient Education method: Explanation, demonstration, tactile and VCs, handouts issued Education comprehension: verbalized understanding, returned demonstration, needs further review   HOME EXERCISE PROGRAM: Use treadmill/stationary bike at home - start with just a few min and work up Sit to stands   ASSESSMENT:  CLINICAL IMPRESSION: Continued  with bil LE strength exercises incorporating high level balance activities. Progressed pt with increased resistance on NuStep along with adding weights to LE exercises. Pt reports feeling challenged by these but felt he tolerated them well. Also reported feeling great after last session with more energy/less fatigue.    OBJECTIVE IMPAIRMENTS decreased activity tolerance, decreased balance, decreased endurance, difficulty walking, decreased strength, impaired sensation, and postural dysfunction.   ACTIVITY LIMITATIONS carrying and lifting  PARTICIPATION LIMITATIONS: community activity and yard work  PERSONAL FACTORS  none  are also affecting patient's functional outcome.   REHAB POTENTIAL: Good  CLINICAL DECISION MAKING: Stable/uncomplicated  EVALUATION COMPLEXITY: Low  GOALS: Goals reviewed with patient? Yes  SHORT TERM GOALS=LONG TERM GOALS Target date: 09/24/2022    Pt will be able to do SLS for at least 15 sec bilaterally to allow him to don his pants with increased ease. Baseline: 2 sec bilat Goal status: INITIAL  2.  Pt will be independent in a home exercise program for continued stretching and strengthening.  Baseline:  Goal status: INITIAL  3.  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  Baseline:  Goal status: INITIAL  4.  Pt will improve R hip flexion strength to 5/5 to decrease fall risk. Baseline: 4/5 Goal status: INITIAL   PLAN: PT FREQUENCY: 2x/week  PT DURATION: 4 weeks  PLANNED INTERVENTIONS: Therapeutic exercises, Therapeutic activity, Neuromuscular re-education, Balance training, Gait training, Patient/Family education, Self Care, and Manual therapy  PLAN FOR NEXT SESSION: Cont high level balance exercises and machines for strengthening; add LE  flexibility to HEP   Otelia Limes, PTA 09/03/2022, 12:15 PM  Heel Raise: Bilateral (Standing)   Cancer Rehab [phone]    Stand near  counter for fingertip support if needed. Rise on balls of feet. Repeat __10-20__ times per set. Do _1-2___ sets per session. Do __2__ sessions per day.  SINGLE LIMB STANCE    Stand at counter (corner if you have one) for minimal arm support. Raise leg. Hold _10-20__ seconds. Repeat with other leg. Once this becomes easier, for increased challenge, close eyes with fingertips on counter for safety. _3-5__ reps per set, _2-3__ sets per day.   Tandem Stance    Stand at counter (corner if you have one). Right foot in front of left, heel touching toe both feet "straight ahead". Stand on Foot Triangle of Support with both feet. Balance in this position _10-20__ seconds. Do with left foot in front of right. Once this becomes easier, for increased challenge, close eyes with fingertips on counter for safety. Also try walking in hallway front and backwards heel-toe with fingertips along wall for safety.   Step-Up: Forward    Move step-stool to counter or hold rail if at steps, but as little as able. Step up forward keeping opposite foot off step. Keep pelvis level and back straight. Hold the single leg stand for 3 seconds, then come back down slowly.  Do _10__ times, do _1-2_ sets, on each leg, _1-2__ times per day.          HIP: Flexion Standing    Holding onto counter lift leg straight in front keeping knee straight. Slow and controlled! _10__ reps per set, _2-3__ sets per day. Then repeat with other leg.   Hip Extension (Standing)    Stand with support at counter. Squeeze pelvic floor and hold so as not to twist hips and don't lean forward.  Move right leg backward with straight knee. Slow and controlled! Repeat _10__ times. Do _2-3__ times a day. Repeat with other leg.  Hip Abduction (Standing)    Stand with support. Squeeze pelvic floor and hold. Lift right leg out to side, keeping toe forward.  Repeat _10__ times. Do _2-3__ times a day. Repeat with other leg.   PELVIC  TILT: Posterior    Tighten abdominals, flatten low back. __10_ reps per set, hold __5__ sec  Then hold pelvic tilt for following: Clam (open/close bent knees) Alt marching Alt leg kicks working towards being able to do heel slides

## 2022-09-03 NOTE — Patient Instructions (Signed)
Heel Raise: Bilateral (Standing)   Cancer Rehab [phone]    Stand near counter for fingertip support if needed. Rise on balls of feet. Repeat __10-20__ times per set. Do _1-2___ sets per session. Do __2__ sessions per day.  SINGLE LIMB STANCE    Stand at counter (corner if you have one) for minimal arm support. Raise leg. Hold _10-20__ seconds. Repeat with other leg. Once this becomes easier, for increased challenge, close eyes with fingertips on counter for safety. _3-5__ reps per set, _2-3__ sets per day.   Tandem Stance    Stand at counter (corner if you have one). Right foot in front of left, heel touching toe both feet "straight ahead". Stand on Foot Triangle of Support with both feet. Balance in this position _10-20__ seconds. Do with left foot in front of right. Once this becomes easier, for increased challenge, close eyes with fingertips on counter for safety. Also try walking in hallway front and backwards heel-toe with fingertips along wall for safety.   Step-Up: Forward    Move step-stool to counter or hold rail if at steps, but as little as able. Step up forward keeping opposite foot off step. Keep pelvis level and back straight. Hold the single leg stand for 3 seconds, then come back down slowly.  Do _10__ times, do _1-2_ sets, on each leg, _1-2__ times per day.          HIP: Flexion Standing    Holding onto counter lift leg straight in front keeping knee straight. Slow and controlled! _10__ reps per set, _2-3__ sets per day. Then repeat with other leg.   Hip Extension (Standing)    Stand with support at counter. Squeeze pelvic floor and hold so as not to twist hips and don't lean forward.  Move right leg backward with straight knee. Slow and controlled! Repeat _10__ times. Do _2-3__ times a day. Repeat with other leg.  Hip Abduction (Standing)    Stand with support. Squeeze pelvic floor and hold. Lift right leg out to side, keeping toe forward.  Repeat  _10__ times. Do _2-3__ times a day. Repeat with other leg.   PELVIC TILT: Posterior    Tighten abdominals, flatten low back. __10_ reps per set, hold __5__ sec  Then hold pelvic tilt for following: Clam (open/close bent knees) Alt marching Alt leg kicks working towards being able to do heel slides

## 2022-09-04 ENCOUNTER — Encounter: Payer: Self-pay | Admitting: Hematology

## 2022-09-05 ENCOUNTER — Inpatient Hospital Stay: Payer: Medicare Other

## 2022-09-08 ENCOUNTER — Ambulatory Visit: Payer: Medicare Other | Admitting: Physical Therapy

## 2022-09-08 ENCOUNTER — Encounter: Payer: Self-pay | Admitting: Physical Therapy

## 2022-09-08 DIAGNOSIS — M6281 Muscle weakness (generalized): Secondary | ICD-10-CM

## 2022-09-08 DIAGNOSIS — R262 Difficulty in walking, not elsewhere classified: Secondary | ICD-10-CM

## 2022-09-08 DIAGNOSIS — C9001 Multiple myeloma in remission: Secondary | ICD-10-CM | POA: Diagnosis not present

## 2022-09-08 DIAGNOSIS — R208 Other disturbances of skin sensation: Secondary | ICD-10-CM

## 2022-09-08 NOTE — Therapy (Signed)
OUTPATIENT PHYSICAL THERAPY ONCOLOGY TREATMENT   Patient Name: Nathan Martinez MRN: [id number] DOB:July 01, 1946, 76 y.o., male Today's Date: 09/08/2022   PT End of Session - 09/08/22 1501     Visit Number 4    Number of Visits 9    Date for PT Re-Evaluation 09/24/22    PT Start Time 1459    PT Stop Time 1550    PT Time Calculation (min) 51 min    Activity Tolerance Patient tolerated treatment well    Behavior During Therapy [facility] for tasks assessed/performed             Past Medical History:  Diagnosis Date   Hypertension    Past Surgical History:  Procedure Laterality Date   KNEE SURGERY Right 2018   PARATHYROID EXPLORATION     Patient Active Problem List   Diagnosis Date Noted   Multiple myeloma not having achieved remission ([place]) 04/16/2022   Multiple myeloma ([place]) 01/24/2022   Counseling regarding advance care planning and goals of care 01/24/2022    PCP: Nathan Ray, MD  REFERRING PROVIDER: [name], MD  REFERRING DIAG: C90.01 (ICD-10-CM) - Multiple myeloma in remission ([place]) R53.83 (ICD-10-CM) - Fatigue, unspecified type   THERAPY DIAG:  Muscle weakness (generalized)  Other disturbances of skin sensation  Difficulty in walking, not elsewhere classified  Multiple myeloma in remission [facility])  ONSET DATE: 06/24/22  Rationale for Evaluation and Treatment Rehabilitation  SUBJECTIVE                                                                                                                                                                                           SUBJECTIVE STATEMENT: Friday I tried to play 18 holes of golf and that really took a lot out of me. I was really tired this weekend.   PERTINENT HISTORY:  Recently diagnosed with acute DVT of the right proximal, mid and distal femoral vein, right popliteal vein and right peroneal veins.  Noted on ultrasound September 26 started on Eliquis. Active myeloma with presence of bone lesions  (one on rib and one on vertebrae) on PET scan with recent remission. 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 history of hypertension, dyslipidemia, glaucoma, GERD, previous Helicobacter pylori infection with  PAIN:  Are you having pain? No  PRECAUTIONS: Other: R TKA 2018, multiple myeloma in remission    WEIGHT BEARING RESTRICTIONS No  FALLS:  Has patient fallen in last 6 months? Yes. Number of falls turned quickly and stepped on a shoe and fell  in the middle of the night when half asleep  LIVING ENVIRONMENT: Lives with: lives with their spouse Nathan Martinez Lives in: House/apartment Stairs: Yes; their bedroom is on the main level, pt reports he occasionally uses the stairs as his gym is upstairs Has following equipment at home:  none  OCCUPATION: retired  LEISURE: pt reports he has not exercises much recently - less than once per week, pt was exercising 1-2 times per week prior to 3 months ago- has gym at home with stationary bike, weights, elliptical  HAND DOMINANCE : right   PRIOR LEVEL OF FUNCTION: Independent  PATIENT GOALS get stronger, get strength back   OBJECTIVE  COGNITION:  Overall cognitive status: Within functional limits for tasks assessed   SENSATION:  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, decreased sensation in bilateral great toe in bilateral feet to light touch, intact proprioception in great toe, no other area of impaired sensation  POSTURE: forward head and rounded shoulders   UPPER EXTREMITY STRENGTH:  grossly 5/5 in shoulder flexion, abduction, and biceps, 3+/5 in triceps   LOWER EXTREMITY STRENGTH:  STRENGTH Right eval  Hip flexion 4/5  Hip extension 4/5  Hip abduction 5/5  Hip adduction   Hip internal rotation   Hip external rotation   Knee flexion 5/5  Knee extension  5/5  Ankle dorsiflexion 5/5  Ankle plantarflexion   Ankle inversion   Ankle eversion    (Blank rows = not tested)  A/PROM LEFT eval  Hip flexion 5/5  Hip extension 4/5  Hip abduction 5/5  Hip adduction   Hip internal rotation   Hip external rotation   Knee flexion 4/5  Knee extension 5/5  Ankle dorsiflexion 5/5  Ankle plantarflexion   Ankle inversion   Ankle eversion    (Blank rows = not tested)  FUNCTIONAL TESTS:  30 seconds chair stand test: 16 which is between good and excellent for his age, pt had increased leg fatigue after this, he first did 11 but then felt he could have done them faster so retested SLS on R: 1st trial 1 sec, 2nd trial 2 sec, 3rd trial 4 sec SLS on L: 1st trial 2 sec, 2nd trial 3 sec, 3rd trial 2 sec  GAIT: Distance walked: 15 Assistive device utilized: None Level of assistance: Complete Independence Comments: some slight decreased foot clearance    TODAY'S TREATMENT  09/08/22 Therapeutic Exercises: NuStep level 6 x10 mins with therapist monitoring pt throughout for fatigue (seat 12, UE 11) Seated edge of mat table for bil HS stretch and bil piriformis figure 4 stretch 2x, 20-30 sec holds; then standing at side of // bars for quad stretch by grabbing ankle behind him at glut 2x, 20 sec each returning therapist demo Supine on mat table for pelvic tilt then with pelvic tilt for following: clams (open/close knees) 2 x 5, 3# on each ankle for following: alt marching 1 x 10 Abdominal Reverse curl x10 Seated EOB for alt LAQs 3# 1 set of 10 each, 2-3 sec holds, pt reports feeling LE fatigue as this was last exercise Neuro Re-ed In // bars with 3# added to ankles: Heel-toe front 4x and retro 4x each with no UE support, slow and controlled high knee marching with 3#  x4 with no UE support; grapevine 2x each direction, VCs to completely cross over midline, pt used bilateral UE support; next standing on air ex for bil LE 3 way raises into flex, ext, and abd  x12  each returning therapist demo; tactile cues to prevent forward trunk lean with ext and v/c to keep core engaged Then stepping over cones 4x with cues for high knees and working to decrease UE support throughout  Leg Press Machine 9100# 2 x 15, pt with improved technique today  09/03/22 Therapeutic Exercises: NuStep level 6 x10 mins with therapist monitoring pt throughout for fatigue (seat 12, UE 11) Then after Neuro Re ed Seated edge of mat table for bil HS stretch and bil piriformis figure 4 stretch 2x, 20-30 sec holds; then standing at side of // bars for quad stretch by grabbing ankle behind him at glut 2x, 20 sec each returning therapist demo Supine on mat table for pelvic tilt then with pelvic tilt for following: clams (open/close knees) 2 x 5, 3# on each ankle for following: alt marching 2 x 5, then LAQ's 2 x 5 returning therapist demo for each  Abdominal Reverse curl x10 Leg Press Machine 90# 2 x 15, pt with improved technique today Seated EOB for alt LAQs 10# 2x5 each, 2-3 sec holds, pt reports feeling LE fatigue as this was last exercise Neuro Re-ed In // bars with 3# added to ankles: Heel-toe front 4x and retro 2x each, slow and controlled high knee marching x4; grapevine 2x each direction, VCs to completely cross over midline; next standing on blue oval for bil LE 3 way raises into flex, ext, and abd x12 each returning therapist demo; tactile cues to prevent forward trunk lean with ext Then stepping over cones 4x with cues for high knees and working to decrease UE support throughout   09/01/22 Therapeutic Exercises: NuStep level 5 x10 mins with therapist monitoring pt throughout for fatigue Supine on mat table (after neuro re ed) for pelvic tilt then with pelvic tilt for following: clams (open/close knees) 2 x 5, alt marching 2 x 5, then LAQ's 2 x 5 returning therapist demo for each  Reverse curl x10 with demo Leg Press Machine 80# 2 x 15, VCs to keep feet in neutral and shoulder  width apart, also to push thru heels, not toes Seated EOB for alt LAQs 10# x10 each with cuing to hold 2-3 sec per rep Neuro Re-ed In // bars: Heel-toe front and retro x4 each, slow and controlled high knee marching x4 (pt struggled with posterior lean); grapevine 2x each direction returning therapist demo for each and VCs throughout for erect posture; seated rest Then stepping over cones 4x with cues for high knees and working to decrease UE support throughout Standing in corner on [address] for purple ball throw multi directional x1 min   08/27/22- educated pt to begin using stationary bike and elliptical at home and slowly work up time doing cardio, start around 5 min; practice sit to stands for an exercise  PATIENT EDUCATION:  Education details: Bil LE hip and core strength Person educated: Patient Education method: Explanation, demonstration, tactile and VCs, handouts issued Education comprehension: verbalized understanding, returned demonstration, needs further review   HOME EXERCISE PROGRAM: Use treadmill/stationary bike at home - start with just a few min and work up Sit to stands   ASSESSMENT:  CLINICAL IMPRESSION: Pt reports he was very fatigued over the weekend. He came to therapy twice last week and played 18 holes of golf. Did not increase resistance for most exercises today but did increase most to 1 set of 10 instead of 2 sets of 5. Did not add new exercises today due to increased fatigue after last session.  Pt already feels like he can tell an improvement since he started PT.   OBJECTIVE IMPAIRMENTS decreased activity tolerance, decreased balance, decreased endurance, difficulty walking, decreased strength, impaired sensation, and postural dysfunction.   ACTIVITY LIMITATIONS carrying and lifting  PARTICIPATION LIMITATIONS: community activity and yard work  PERSONAL FACTORS  none  are also affecting patient's functional outcome.   REHAB POTENTIAL: Good  CLINICAL  DECISION MAKING: Stable/uncomplicated  EVALUATION COMPLEXITY: Low  GOALS: Goals reviewed with patient? Yes  SHORT TERM GOALS=LONG TERM GOALS Target date: 09/24/2022    Pt will be able to do SLS for at least 15 sec bilaterally to allow him to don his pants with increased ease. Baseline: 2 sec bilat Goal status: INITIAL  2.  Pt will be independent in a home exercise program for continued stretching and strengthening.  Baseline:  Goal status: INITIAL  3.  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  Baseline:  Goal status: INITIAL  4.  Pt will improve R hip flexion strength to 5/5 to decrease fall risk. Baseline: 4/5 Goal status: INITIAL   PLAN: PT FREQUENCY: 2x/week  PT DURATION: 4 weeks  PLANNED INTERVENTIONS: Therapeutic exercises, Therapeutic activity, Neuromuscular re-education, Balance training, Gait training, Patient/Family education, Self Care, and Manual therapy  PLAN FOR NEXT SESSION: Cont high level balance exercises and machines for strengthening; add LE flexibility to HEP   [employer], PT 09/08/2022, 3:55 PM  Heel Raise: Bilateral (Standing)   Cancer Rehab [phone]    Stand near counter for fingertip support if needed. Rise on balls of feet. Repeat __10-20__ times per set. Do _1-2___ sets per session. Do __2__ sessions per day.  SINGLE LIMB STANCE    Stand at counter (corner if you have one) for minimal arm support. Raise leg. Hold _10-20__ seconds. Repeat with other leg. Once this becomes easier, for increased challenge, close eyes with fingertips on counter for safety. _3-5__ reps per set, _2-3__ sets per day.   Tandem Stance    Stand at counter (corner if you have one). Right foot in front of left, heel touching toe both feet "straight ahead". Stand on Foot Triangle of Support with both feet. Balance in this position _10-20__ seconds. Do with left foot in front of right. Once  this becomes easier, for increased challenge, close eyes with fingertips on counter for safety. Also try walking in hallway front and backwards heel-toe with fingertips along wall for safety.   Step-Up: Forward    Move step-stool to counter or hold rail if at steps, but as little as able. Step up forward keeping opposite foot off step. Keep pelvis level and back straight. Hold the single leg stand for 3 seconds, then come back down slowly.  Do _10__ times, do _1-2_ sets, on each leg, _1-2__ times per day.          HIP: Flexion Standing    Holding onto counter lift leg straight in front keeping knee straight. Slow and controlled! _10__ reps per set, _2-3__ sets per day. Then repeat with other leg.   Hip Extension (Standing)    Stand with support at counter. Squeeze pelvic floor and hold so as not to twist hips and don't lean forward.  Move right leg backward with straight knee. Slow and controlled! Repeat _10__ times. Do _2-3__ times a day. Repeat with other leg.  Hip Abduction (Standing)    Stand with support. Squeeze pelvic floor and hold. Lift right leg  out to side, keeping toe forward.  Repeat _10__ times. Do _2-3__ times a day. Repeat with other leg.   PELVIC TILT: Posterior    Tighten abdominals, flatten low back. __10_ reps per set, hold __5__ sec  Then hold pelvic tilt for following: Clam (open/close bent knees) Alt marching Alt leg kicks working towards being able to do heel slides

## 2022-09-10 ENCOUNTER — Other Ambulatory Visit: Payer: Self-pay | Admitting: Hematology

## 2022-09-10 DIAGNOSIS — C9000 Multiple myeloma not having achieved remission: Secondary | ICD-10-CM

## 2022-09-11 ENCOUNTER — Ambulatory Visit: Payer: Medicare Other | Admitting: Physical Therapy

## 2022-09-12 ENCOUNTER — Ambulatory Visit: Payer: Medicare Other | Admitting: Physical Therapy

## 2022-09-12 ENCOUNTER — Encounter: Payer: Self-pay | Admitting: Physical Therapy

## 2022-09-12 DIAGNOSIS — R262 Difficulty in walking, not elsewhere classified: Secondary | ICD-10-CM

## 2022-09-12 DIAGNOSIS — M6281 Muscle weakness (generalized): Secondary | ICD-10-CM

## 2022-09-12 DIAGNOSIS — R208 Other disturbances of skin sensation: Secondary | ICD-10-CM

## 2022-09-12 DIAGNOSIS — C9001 Multiple myeloma in remission: Secondary | ICD-10-CM | POA: Diagnosis not present

## 2022-09-12 NOTE — Therapy (Signed)
OUTPATIENT PHYSICAL THERAPY ONCOLOGY TREATMENT   Patient Name: Nathan Martinez MRN: [id number] DOB:02-Feb-1946, 76 y.o., male Today's Date: 09/12/2022   PT End of Session - 09/12/22 1008     Visit Number 5    Number of Visits 9    Date for PT Re-Evaluation 09/24/22    PT Start Time 1007    PT Stop Time 1052    PT Time Calculation (min) 45 min    Activity Tolerance Patient tolerated treatment well    Behavior During Therapy [facility] for tasks assessed/performed             Past Medical History:  Diagnosis Date   Hypertension    Past Surgical History:  Procedure Laterality Date   KNEE SURGERY Right 2018   PARATHYROID EXPLORATION     Patient Active Problem List   Diagnosis Date Noted   Multiple myeloma not having achieved remission ([place]) 04/16/2022   Multiple myeloma ([place]) 01/24/2022   Counseling regarding advance care planning and goals of care 01/24/2022    PCP: Merri Ray, MD  REFERRING PROVIDER: [name], MD  REFERRING DIAG: C90.01 (ICD-10-CM) - Multiple myeloma in remission ([place]) R53.83 (ICD-10-CM) - Fatigue, unspecified type   THERAPY DIAG:  Muscle weakness (generalized)  Other disturbances of skin sensation  Difficulty in walking, not elsewhere classified  Multiple myeloma in remission [facility])  ONSET DATE: 06/24/22  Rationale for Evaluation and Treatment Rehabilitation  SUBJECTIVE                                                                                                                                                                                           SUBJECTIVE STATEMENT: I felt great after last session.   PERTINENT HISTORY:  Recently diagnosed with acute DVT of the right proximal, mid and distal femoral vein, right popliteal vein and right peroneal veins.  Noted on ultrasound September 26 started on Eliquis. Active myeloma with presence of bone lesions (one on rib and one on vertebrae) on PET scan with recent remission. 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 history of hypertension, dyslipidemia, glaucoma, GERD, previous Helicobacter pylori infection with  PAIN:  Are you having pain? No pt reports hehas neuropathy but that it is improving  PRECAUTIONS: Other: R TKA 2018, multiple myeloma in remission    WEIGHT BEARING RESTRICTIONS No  FALLS:  Has patient fallen in last 6 months? Yes. Number of falls turned quickly and stepped on a shoe and fell in the middle of the night when half asleep  LIVING ENVIRONMENT: Lives with: lives with their spouse [name] Lives in: House/apartment Stairs: Yes; their bedroom is on the main level, pt reports he occasionally uses the stairs as his gym is upstairs Has following equipment at home:  none  OCCUPATION: retired  LEISURE: pt reports he has not exercises much recently - less than once per week, pt was exercising 1-2 times per week prior to 3 months ago- has gym at home with stationary bike, weights, elliptical  HAND DOMINANCE : right   PRIOR LEVEL OF FUNCTION: Independent  PATIENT GOALS get stronger, get strength back   OBJECTIVE  COGNITION:  Overall cognitive status: Within functional limits for tasks assessed   SENSATION:  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, decreased sensation in bilateral great toe in bilateral feet to light touch, intact proprioception in great toe, no other area of impaired sensation  POSTURE: forward head and rounded shoulders   UPPER EXTREMITY STRENGTH:  grossly 5/5 in shoulder flexion, abduction, and biceps, 3+/5 in triceps   LOWER EXTREMITY STRENGTH:  STRENGTH Right eval  Hip flexion 4/5  Hip extension 4/5  Hip abduction 5/5  Hip adduction   Hip internal rotation   Hip external rotation   Knee flexion 5/5  Knee extension 5/5  Ankle dorsiflexion  5/5  Ankle plantarflexion   Ankle inversion   Ankle eversion    (Blank rows = not tested)  A/PROM LEFT eval  Hip flexion 5/5  Hip extension 4/5  Hip abduction 5/5  Hip adduction   Hip internal rotation   Hip external rotation   Knee flexion 4/5  Knee extension 5/5  Ankle dorsiflexion 5/5  Ankle plantarflexion   Ankle inversion   Ankle eversion    (Blank rows = not tested)  FUNCTIONAL TESTS:  30 seconds chair stand test: 16 which is between good and excellent for his age, pt had increased leg fatigue after this, he first did 11 but then felt he could have done them faster so retested SLS on R: 1st trial 1 sec, 2nd trial 2 sec, 3rd trial 4 sec SLS on L: 1st trial 2 sec, 2nd trial 3 sec, 3rd trial 2 sec  GAIT: Distance walked: 15 Assistive device utilized: None Level of assistance: Complete Independence Comments: some slight decreased foot clearance    TODAY'S TREATMENT  09/12/22 Therapeutic Exercises: NuStep level 6 x10 mins with therapist monitoring pt throughout for fatigue (seat 12, UE 11) - 795 steps Seated edge of mat table for bil HS stretch and bil piriformis figure 4 stretch 2x, 20-30 sec holds; then standing at side of // bars for quad stretch by grabbing ankle behind him at glut 2x, 20 sec each returning therapist demo 3 way hip using hip machine on ortho side 10 reps in direction of flexion, abduction and extension with 55 lbs Leg Press Machine 110# 1x 20  Supine on mat table for pelvic tilt then with pelvic tilt for following: clams (open/close knees) 1x10, alt marching 1 x 10 Abdominal Reverse curl x10 Seated EOB for alt LAQs 3# 1 set of 10 each, 2-3 sec holds Neuro Re-ed In // bars: Heel-toe front 4x and retro 4x each with no UE support; grapevine 2x each direction, VCs to completely cross over midline, pt used bilateral UE support; heel/toe walking on air ex x 4 with pt reporting increased challenge Then stepping over cones 4x with cues for high knees and  working to decrease UE support throughout   09/08/22 Therapeutic  Exercises: NuStep level 6 x10 mins with therapist monitoring pt throughout for fatigue (seat 12, UE 11) Seated edge of mat table for bil HS stretch and bil piriformis figure 4 stretch 2x, 20-30 sec holds; then standing at side of // bars for quad stretch by grabbing ankle behind him at glut 2x, 20 sec each returning therapist demo Supine on mat table for pelvic tilt then with pelvic tilt for following: clams (open/close knees) 2 x 5, 3# on each ankle for following: alt marching 1 x 10 Abdominal Reverse curl x10 Seated EOB for alt LAQs 3# 1 set of 10 each, 2-3 sec holds, pt reports feeling LE fatigue as this was last exercise Neuro Re-ed In // bars with 3# added to ankles: Heel-toe front 4x and retro 4x each with no UE support, slow and controlled high knee marching with 3#  x4 with no UE support; grapevine 2x each direction, VCs to completely cross over midline, pt used bilateral UE support; next standing on air ex for bil LE 3 way raises into flex, ext, and abd x12 each returning therapist demo; tactile cues to prevent forward trunk lean with ext and v/c to keep core engaged Then stepping over cones 4x with cues for high knees and working to decrease UE support throughout  Leg Press Machine 9100# 2 x 15, pt with improved technique today  09/03/22 Therapeutic Exercises: NuStep level 6 x10 mins with therapist monitoring pt throughout for fatigue (seat 12, UE 11) Then after Neuro Re ed Seated edge of mat table for bil HS stretch and bil piriformis figure 4 stretch 2x, 20-30 sec holds; then standing at side of // bars for quad stretch by grabbing ankle behind him at glut 2x, 20 sec each returning therapist demo Supine on mat table for pelvic tilt then with pelvic tilt for following: clams (open/close knees) 2 x 5, 3# on each ankle for following: alt marching 2 x 5, then LAQ's 2 x 5 returning therapist demo for each  Abdominal Reverse  curl x10 Leg Press Machine 90# 2 x 15, pt with improved technique today Seated EOB for alt LAQs 10# 2x5 each, 2-3 sec holds, pt reports feeling LE fatigue as this was last exercise Neuro Re-ed In // bars with 3# added to ankles: Heel-toe front 4x and retro 2x each, slow and controlled high knee marching x4; grapevine 2x each direction, VCs to completely cross over midline; next standing on blue oval for bil LE 3 way raises into flex, ext, and abd x12 each returning therapist demo; tactile cues to prevent forward trunk lean with ext Then stepping over cones 4x with cues for high knees and working to decrease UE support throughout   09/01/22 Therapeutic Exercises: NuStep level 5 x10 mins with therapist monitoring pt throughout for fatigue Supine on mat table (after neuro re ed) for pelvic tilt then with pelvic tilt for following: clams (open/close knees) 2 x 5, alt marching 2 x 5, then LAQ's 2 x 5 returning therapist demo for each  Reverse curl x10 with demo Leg Press Machine 80# 2 x 15, VCs to keep feet in neutral and shoulder width apart, also to push thru heels, not toes Seated EOB for alt LAQs 10# x10 each with cuing to hold 2-3 sec per rep Neuro Re-ed In // bars: Heel-toe front and retro x4 each, slow and controlled high knee marching x4 (pt struggled with posterior lean); grapevine 2x each direction returning therapist demo for each and VCs throughout for erect  posture; seated rest Then stepping over cones 4x with cues for high knees and working to decrease UE support throughout Standing in corner on [address] for purple ball throw multi directional x1 min   08/27/22- educated pt to begin using stationary bike and elliptical at home and slowly work up time doing cardio, start around 5 min; practice sit to stands for an exercise  PATIENT EDUCATION:  Education details: Bil LE hip and core strength Person educated: Patient Education method: Explanation, demonstration, tactile and VCs, handouts  issued Education comprehension: verbalized understanding, returned demonstration, needs further review   HOME EXERCISE PROGRAM: Use treadmill/stationary bike at home - start with just a few min and work up Sit to stands   ASSESSMENT:  CLINICAL IMPRESSION: Pt reports he did very well after last session and did not have increased fatigue. Added 3 way hip exercise today with 55 lbs and pt did very well with this - he reported a challenge but no increased fatigue. He did not require any recovery periods this session. He was able to tolerate increased reps per set. He feels he is getting stronger and can really tell he is progressing. Next session will advance pt's HEP.    OBJECTIVE IMPAIRMENTS decreased activity tolerance, decreased balance, decreased endurance, difficulty walking, decreased strength, impaired sensation, and postural dysfunction.   ACTIVITY LIMITATIONS carrying and lifting  PARTICIPATION LIMITATIONS: community activity and yard work  PERSONAL FACTORS  none  are also affecting patient's functional outcome.   REHAB POTENTIAL: Good  CLINICAL DECISION MAKING: Stable/uncomplicated  EVALUATION COMPLEXITY: Low  GOALS: Goals reviewed with patient? Yes  SHORT TERM GOALS=LONG TERM GOALS Target date: 09/24/2022    Pt will be able to do SLS for at least 15 sec bilaterally to allow him to don his pants with increased ease. Baseline: 2 sec bilat Goal status: INITIAL  2.  Pt will be independent in a home exercise program for continued stretching and strengthening.  Baseline:  Goal status: INITIAL  3.  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  Baseline:  Goal status: INITIAL  4.  Pt will improve R hip flexion strength to 5/5 to decrease fall risk. Baseline: 4/5 Goal status: INITIAL   PLAN: PT FREQUENCY: 2x/week  PT DURATION: 4 weeks  PLANNED INTERVENTIONS: Therapeutic exercises, Therapeutic activity,  Neuromuscular re-education, Balance training, Gait training, Patient/Family education, Self Care, and Manual therapy  PLAN FOR NEXT SESSION: Cont high level balance exercises and machines for strengthening; add LE flexibility to HEP   [employer], PT 09/12/2022, 10:59 AM  Heel Raise: Bilateral (Standing)   Cancer Rehab [phone]    Stand near counter for fingertip support if needed. Rise on balls of feet. Repeat __10-20__ times per set. Do _1-2___ sets per session. Do __2__ sessions per day.  SINGLE LIMB STANCE    Stand at counter (corner if you have one) for minimal arm support. Raise leg. Hold _10-20__ seconds. Repeat with other leg. Once this becomes easier, for increased challenge, close eyes with fingertips on counter for safety. _3-5__ reps per set, _2-3__ sets per day.   Tandem Stance    Stand at counter (corner if you have one). Right foot in front of left, heel touching toe both feet "straight ahead". Stand on Foot Triangle of Support with both feet. Balance in this position _10-20__ seconds. Do with left foot in front of right. Once this becomes easier, for increased challenge, close eyes with fingertips on  counter for safety. Also try walking in hallway front and backwards heel-toe with fingertips along wall for safety.   Step-Up: Forward    Move step-stool to counter or hold rail if at steps, but as little as able. Step up forward keeping opposite foot off step. Keep pelvis level and back straight. Hold the single leg stand for 3 seconds, then come back down slowly.  Do _10__ times, do _1-2_ sets, on each leg, _1-2__ times per day.          HIP: Flexion Standing    Holding onto counter lift leg straight in front keeping knee straight. Slow and controlled! _10__ reps per set, _2-3__ sets per day. Then repeat with other leg.   Hip Extension (Standing)    Stand with support at counter. Squeeze pelvic floor and hold so as not to twist hips and don't  lean forward.  Move right leg backward with straight knee. Slow and controlled! Repeat _10__ times. Do _2-3__ times a day. Repeat with other leg.  Hip Abduction (Standing)    Stand with support. Squeeze pelvic floor and hold. Lift right leg out to side, keeping toe forward.  Repeat _10__ times. Do _2-3__ times a day. Repeat with other leg.   PELVIC TILT: Posterior    Tighten abdominals, flatten low back. __10_ reps per set, hold __5__ sec  Then hold pelvic tilt for following: Clam (open/close bent knees) Alt marching Alt leg kicks working towards being able to do heel slides

## 2022-09-15 ENCOUNTER — Ambulatory Visit: Payer: Medicare Other | Admitting: Physical Therapy

## 2022-09-15 ENCOUNTER — Encounter: Payer: Self-pay | Admitting: Physical Therapy

## 2022-09-15 DIAGNOSIS — M6281 Muscle weakness (generalized): Secondary | ICD-10-CM

## 2022-09-15 DIAGNOSIS — R208 Other disturbances of skin sensation: Secondary | ICD-10-CM

## 2022-09-15 DIAGNOSIS — C9001 Multiple myeloma in remission: Secondary | ICD-10-CM | POA: Diagnosis not present

## 2022-09-15 DIAGNOSIS — R262 Difficulty in walking, not elsewhere classified: Secondary | ICD-10-CM

## 2022-09-15 NOTE — Therapy (Signed)
OUTPATIENT PHYSICAL THERAPY ONCOLOGY TREATMENT   Patient Name: Nathan Martinez MRN: [id number] DOB:15-Jul-1946, 76 y.o., male Today's Date: 09/15/2022   PT End of Session - 09/15/22 1233     Visit Number 6    Number of Visits 9    Date for PT Re-Evaluation 09/24/22    PT Start Time 1202    PT Stop Time 1251    PT Time Calculation (min) 49 min    Activity Tolerance Patient tolerated treatment well    Behavior During Therapy [facility] for tasks assessed/performed              Past Medical History:  Diagnosis Date   Hypertension    Past Surgical History:  Procedure Laterality Date   KNEE SURGERY Right 2018   PARATHYROID EXPLORATION     Patient Active Problem List   Diagnosis Date Noted   Multiple myeloma not having achieved remission ([place]) 04/16/2022   Multiple myeloma ([place]) 01/24/2022   Counseling regarding advance care planning and goals of care 01/24/2022    PCP: Merri Ray, MD  REFERRING PROVIDER: [name], MD  REFERRING DIAG: C90.01 (ICD-10-CM) - Multiple myeloma in remission ([place]) R53.83 (ICD-10-CM) - Fatigue, unspecified type   THERAPY DIAG:  Muscle weakness (generalized)  Other disturbances of skin sensation  Difficulty in walking, not elsewhere classified  Multiple myeloma in remission [facility])  ONSET DATE: 06/24/22  Rationale for Evaluation and Treatment Rehabilitation  SUBJECTIVE                                                                                                                                                                                           SUBJECTIVE STATEMENT: I played golf yesterday. My legs are a little sore - the lower leg.   PERTINENT HISTORY:  Recently diagnosed with acute DVT of the right proximal, mid and distal femoral vein, right popliteal vein and right peroneal veins.  Noted on ultrasound September 26 started on Eliquis. Active myeloma with presence of bone lesions (one on rib and one on vertebrae) on PET  scan with recent remission. 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 history of hypertension, dyslipidemia, glaucoma, GERD, previous Helicobacter pylori infection with  PAIN:  Are you having pain? Yes 5 or 6/10 in his tooth, he thinks he lost a filling, will go to the dentist tomorrow  PRECAUTIONS: Other: R TKA 2018, multiple myeloma in remission    WEIGHT BEARING RESTRICTIONS No  FALLS:  Has patient fallen in last 6 months? Yes. Number of falls  turned quickly and stepped on a shoe and fell in the middle of the night when half asleep  LIVING ENVIRONMENT: Lives with: lives with their spouse [name] Lives in: House/apartment Stairs: Yes; their bedroom is on the main level, pt reports he occasionally uses the stairs as his gym is upstairs Has following equipment at home:  none  OCCUPATION: retired  LEISURE: pt reports he has not exercises much recently - less than once per week, pt was exercising 1-2 times per week prior to 3 months ago- has gym at home with stationary bike, weights, elliptical  HAND DOMINANCE : right   PRIOR LEVEL OF FUNCTION: Independent  PATIENT GOALS get stronger, get strength back   OBJECTIVE  COGNITION:  Overall cognitive status: Within functional limits for tasks assessed   SENSATION:  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, decreased sensation in bilateral great toe in bilateral feet to light touch, intact proprioception in great toe, no other area of impaired sensation  POSTURE: forward head and rounded shoulders   UPPER EXTREMITY STRENGTH:  grossly 5/5 in shoulder flexion, abduction, and biceps, 3+/5 in triceps   LOWER EXTREMITY STRENGTH:  STRENGTH Right eval  Hip flexion 4/5  Hip extension 4/5  Hip abduction 5/5  Hip adduction   Hip internal rotation   Hip external  rotation   Knee flexion 5/5  Knee extension 5/5  Ankle dorsiflexion 5/5  Ankle plantarflexion   Ankle inversion   Ankle eversion    (Blank rows = not tested)  A/PROM LEFT eval  Hip flexion 5/5  Hip extension 4/5  Hip abduction 5/5  Hip adduction   Hip internal rotation   Hip external rotation   Knee flexion 4/5  Knee extension 5/5  Ankle dorsiflexion 5/5  Ankle plantarflexion   Ankle inversion   Ankle eversion    (Blank rows = not tested)  FUNCTIONAL TESTS:  30 seconds chair stand test: 16 which is between good and excellent for his age, pt had increased leg fatigue after this, he first did 11 but then felt he could have done them faster so retested SLS on R: 1st trial 1 sec, 2nd trial 2 sec, 3rd trial 4 sec SLS on L: 1st trial 2 sec, 2nd trial 3 sec, 3rd trial 2 sec  GAIT: Distance walked: 15 Assistive device utilized: None Level of assistance: Complete Independence Comments: some slight decreased foot clearance    TODAY'S TREATMENT  09/15/22 Therapeutic Exercises: NuStep level 6 x10 mins with therapist monitoring pt throughout for fatigue (seat 13, UE 11) - 815 steps Seated edge of mat table for bil HS stretch and bil piriformis figure 4 stretch 2x, 20-30 sec holds; then standing at side of // bars for quad stretch by grabbing ankle behind him at glut 2x, 20 sec each returning therapist demo 3 way hip using hip machine on ortho side 10 reps in direction of flexion, abduction and extension with 70 lbs Leg Press Machine 130# 1x 20  Supine on mat table for pelvic tilt then with pelvic tilt for following: clams (open/close knees) 1x10, alt marching 1 x 10 Abdominal Reverse curl x10 Neuro Re-ed In // bars: Heel-toe front 4x and retro 4x each with occasional UE support; grapevine 4x each direction, pt demonstrating improved ability to cross over midline,  heel/toe walking on air ex x 4 with no UE support Then stepping over cones 4x with cues for high knees and working to  decrease UE support throughout  Standing  in // bars with colored circles and small cones on top in a circle around him and having to step on color called out by therapist and tap with foot without crushing cone with pt reporting challenge with this Marching on air ex x 10, heel raises on air ex x 10 reps, step ups on air ex x 10 reps  09/12/22 Therapeutic Exercises: NuStep level 6 x10 mins with therapist monitoring pt throughout for fatigue (seat 12, UE 11) - 795 steps Seated edge of mat table for bil HS stretch and bil piriformis figure 4 stretch 2x, 20-30 sec holds; then standing at side of // bars for quad stretch by grabbing ankle behind him at glut 2x, 20 sec each returning therapist demo 3 way hip using hip machine on ortho side 10 reps in direction of flexion, abduction and extension with 55 lbs Leg Press Machine 110# 1x 20  Supine on mat table for pelvic tilt then with pelvic tilt for following: clams (open/close knees) 1x10, alt marching 1 x 10 Abdominal Reverse curl x10 Seated EOB for alt LAQs 3# 1 set of 10 each, 2-3 sec holds Neuro Re-ed In // bars: Heel-toe front 4x and retro 4x each with no UE support; grapevine 2x each direction, VCs to completely cross over midline, pt used bilateral UE support; heel/toe walking on air ex x 4 with pt reporting increased challenge Then stepping over cones 4x with cues for high knees and working to decrease UE support throughout   09/08/22 Therapeutic Exercises: NuStep level 6 x10 mins with therapist monitoring pt throughout for fatigue (seat 12, UE 11) Seated edge of mat table for bil HS stretch and bil piriformis figure 4 stretch 2x, 20-30 sec holds; then standing at side of // bars for quad stretch by grabbing ankle behind him at glut 2x, 20 sec each returning therapist demo Supine on mat table for pelvic tilt then with pelvic tilt for following: clams (open/close knees) 2 x 5, 3# on each ankle for following: alt marching 1 x 10 Abdominal  Reverse curl x10 Seated EOB for alt LAQs 3# 1 set of 10 each, 2-3 sec holds, pt reports feeling LE fatigue as this was last exercise Neuro Re-ed In // bars with 3# added to ankles: Heel-toe front 4x and retro 4x each with no UE support, slow and controlled high knee marching with 3#  x4 with no UE support; grapevine 2x each direction, VCs to completely cross over midline, pt used bilateral UE support; next standing on air ex for bil LE 3 way raises into flex, ext, and abd x12 each returning therapist demo; tactile cues to prevent forward trunk lean with ext and v/c to keep core engaged Then stepping over cones 4x with cues for high knees and working to decrease UE support throughout  Leg Press Machine 9100# 2 x 15, pt with improved technique today  09/03/22 Therapeutic Exercises: NuStep level 6 x10 mins with therapist monitoring pt throughout for fatigue (seat 12, UE 11) Then after Neuro Re ed Seated edge of mat table for bil HS stretch and bil piriformis figure 4 stretch 2x, 20-30 sec holds; then standing at side of // bars for quad stretch by grabbing ankle behind him at glut 2x, 20 sec each returning therapist demo Supine on mat table for pelvic tilt then with pelvic tilt for following: clams (open/close knees) 2 x 5, 3# on each ankle for following: alt marching 2 x 5, then LAQ's 2 x 5 returning therapist demo  for each  Abdominal Reverse curl x10 Leg Press Machine 90# 2 x 15, pt with improved technique today Seated EOB for alt LAQs 10# 2x5 each, 2-3 sec holds, pt reports feeling LE fatigue as this was last exercise Neuro Re-ed In // bars with 3# added to ankles: Heel-toe front 4x and retro 2x each, slow and controlled high knee marching x4; grapevine 2x each direction, VCs to completely cross over midline; next standing on blue oval for bil LE 3 way raises into flex, ext, and abd x12 each returning therapist demo; tactile cues to prevent forward trunk lean with ext Then stepping over cones 4x  with cues for high knees and working to decrease UE support throughout   09/01/22 Therapeutic Exercises: NuStep level 5 x10 mins with therapist monitoring pt throughout for fatigue Supine on mat table (after neuro re ed) for pelvic tilt then with pelvic tilt for following: clams (open/close knees) 2 x 5, alt marching 2 x 5, then LAQ's 2 x 5 returning therapist demo for each  Reverse curl x10 with demo Leg Press Machine 80# 2 x 15, VCs to keep feet in neutral and shoulder width apart, also to push thru heels, not toes Seated EOB for alt LAQs 10# x10 each with cuing to hold 2-3 sec per rep Neuro Re-ed In // bars: Heel-toe front and retro x4 each, slow and controlled high knee marching x4 (pt struggled with posterior lean); grapevine 2x each direction returning therapist demo for each and VCs throughout for erect posture; seated rest Then stepping over cones 4x with cues for high knees and working to decrease UE support throughout Standing in corner on [address] for purple ball throw multi directional x1 min   08/27/22- educated pt to begin using stationary bike and elliptical at home and slowly work up time doing cardio, start around 5 min; practice sit to stands for an exercise  PATIENT EDUCATION:  Education details: Bil LE hip and core strength Person educated: Patient Education method: Explanation, demonstration, tactile and VCs, handouts issued Education comprehension: verbalized understanding, returned demonstration, needs further review   HOME EXERCISE PROGRAM: Use treadmill/stationary bike at home - start with just a few min and work up Sit to stands   ASSESSMENT:  CLINICAL IMPRESSION: Assessed pt's progress towards goals in therapy. He has nearly met all goals for therapy. He is still having difficulty with higher level balance activities so will continue to focus on those. Will also work on creating a final HEP that is challenging for pt to continue at home.    OBJECTIVE  IMPAIRMENTS decreased activity tolerance, decreased balance, decreased endurance, difficulty walking, decreased strength, impaired sensation, and postural dysfunction.   ACTIVITY LIMITATIONS carrying and lifting  PARTICIPATION LIMITATIONS: community activity and yard work  PERSONAL FACTORS  none  are also affecting patient's functional outcome.   REHAB POTENTIAL: Good  CLINICAL DECISION MAKING: Stable/uncomplicated  EVALUATION COMPLEXITY: Low  GOALS: Goals reviewed with patient? Yes  SHORT TERM GOALS=LONG TERM GOALS Target date: 09/24/2022    Pt will be able to do SLS for at least 15 sec bilaterally to allow him to don his pants with increased ease. Baseline: 2 sec bilat Goal status: MET 09/15/22 R 17 sec, L 21 sec  2.  Pt will be independent in a home exercise program for continued stretching and strengthening.  Baseline:  Goal status: INITIAL  3.  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  Baseline:  Goal status: MET 09/15/22 50% improvement  4.  Pt will improve R hip flexion strength to 5/5 to decrease fall risk. Baseline: 4/5 Goal status: MET 09/15/22 - 5/5   PLAN: PT FREQUENCY: 2x/week  PT DURATION: 4 weeks  PLANNED INTERVENTIONS: Therapeutic exercises, Therapeutic activity, Neuromuscular re-education, Balance training, Gait training, Patient/Family education, Self Care, and Manual therapy  PLAN FOR NEXT SESSION: Cont high level balance exercises and machines for strengthening; update HEP to make exercises more challenging   [employer], PT 09/15/2022, 1:04 PM  Heel Raise: Bilateral (Standing)   Cancer Rehab [phone]    Stand near counter for fingertip support if needed. Rise on balls of feet. Repeat __10-20__ times per set. Do _1-2___ sets per session. Do __2__ sessions per day.  SINGLE LIMB STANCE    Stand at counter (corner if you have one) for minimal arm support. Raise leg. Hold  _10-20__ seconds. Repeat with other leg. Once this becomes easier, for increased challenge, close eyes with fingertips on counter for safety. _3-5__ reps per set, _2-3__ sets per day.   Tandem Stance    Stand at counter (corner if you have one). Right foot in front of left, heel touching toe both feet "straight ahead". Stand on Foot Triangle of Support with both feet. Balance in this position _10-20__ seconds. Do with left foot in front of right. Once this becomes easier, for increased challenge, close eyes with fingertips on counter for safety. Also try walking in hallway front and backwards heel-toe with fingertips along wall for safety.   Step-Up: Forward    Move step-stool to counter or hold rail if at steps, but as little as able. Step up forward keeping opposite foot off step. Keep pelvis level and back straight. Hold the single leg stand for 3 seconds, then come back down slowly.  Do _10__ times, do _1-2_ sets, on each leg, _1-2__ times per day.          HIP: Flexion Standing    Holding onto counter lift leg straight in front keeping knee straight. Slow and controlled! _10__ reps per set, _2-3__ sets per day. Then repeat with other leg.   Hip Extension (Standing)    Stand with support at counter. Squeeze pelvic floor and hold so as not to twist hips and don't lean forward.  Move right leg backward with straight knee. Slow and controlled! Repeat _10__ times. Do _2-3__ times a day. Repeat with other leg.  Hip Abduction (Standing)    Stand with support. Squeeze pelvic floor and hold. Lift right leg out to side, keeping toe forward.  Repeat _10__ times. Do _2-3__ times a day. Repeat with other leg.   PELVIC TILT: Posterior    Tighten abdominals, flatten low back. __10_ reps per set, hold __5__ sec  Then hold pelvic tilt for following: Clam (open/close bent knees) Alt marching Alt leg kicks working towards being able to do heel slides

## 2022-09-17 ENCOUNTER — Ambulatory Visit: Payer: Medicare Other | Admitting: Physical Therapy

## 2022-09-17 ENCOUNTER — Encounter: Payer: Self-pay | Admitting: Physical Therapy

## 2022-09-17 DIAGNOSIS — R208 Other disturbances of skin sensation: Secondary | ICD-10-CM

## 2022-09-17 DIAGNOSIS — M6281 Muscle weakness (generalized): Secondary | ICD-10-CM

## 2022-09-17 DIAGNOSIS — R262 Difficulty in walking, not elsewhere classified: Secondary | ICD-10-CM

## 2022-09-17 DIAGNOSIS — C9001 Multiple myeloma in remission: Secondary | ICD-10-CM

## 2022-09-17 NOTE — Therapy (Signed)
OUTPATIENT PHYSICAL THERAPY ONCOLOGY TREATMENT   Patient Name: Nathan Martinez MRN: [id number] DOB:1946/08/07, 76 y.o., male Today's Date: 09/17/2022   PT End of Session - 09/17/22 1153     Visit Number 7    Number of Visits 9    Date for PT Re-Evaluation 09/24/22    PT Start Time 1150    PT Stop Time 1253    PT Time Calculation (min) 63 min    Activity Tolerance Patient tolerated treatment well    Behavior During Therapy [facility] for tasks assessed/performed               Past Medical History:  Diagnosis Date   Hypertension    Past Surgical History:  Procedure Laterality Date   KNEE SURGERY Right 2018   PARATHYROID EXPLORATION     Patient Active Problem List   Diagnosis Date Noted   Multiple myeloma not having achieved remission ([place]) 04/16/2022   Multiple myeloma ([place]) 01/24/2022   Counseling regarding advance care planning and goals of care 01/24/2022    PCP: Merri Ray, MD  REFERRING PROVIDER: [name], MD  REFERRING DIAG: C90.01 (ICD-10-CM) - Multiple myeloma in remission ([place]) R53.83 (ICD-10-CM) - Fatigue, unspecified type   THERAPY DIAG:  Muscle weakness (generalized)  Other disturbances of skin sensation  Difficulty in walking, not elsewhere classified  Multiple myeloma in remission [facility])  ONSET DATE: 06/24/22  Rationale for Evaluation and Treatment Rehabilitation  SUBJECTIVE                                                                                                                                                                                           SUBJECTIVE STATEMENT: I felt good after last session. Later in the evening I got a little tired.   PERTINENT HISTORY:  Recently diagnosed with acute DVT of the right proximal, mid and distal femoral vein, right popliteal vein and right peroneal veins.  Noted on ultrasound September 26 started on Eliquis. Active myeloma with presence of bone lesions (one on rib and one on vertebrae)  on PET scan with recent remission. 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 history of hypertension, dyslipidemia, glaucoma, GERD, previous Helicobacter pylori infection with  PAIN:  Are you having pain? No   PRECAUTIONS: Other: R TKA 2018, multiple myeloma in remission    WEIGHT BEARING RESTRICTIONS No  FALLS:  Has patient fallen in last 6 months? Yes. Number of falls turned quickly and stepped on a shoe and fell in the middle of the night  when half asleep  LIVING ENVIRONMENT: Lives with: lives with their spouse [name] Lives in: House/apartment Stairs: Yes; their bedroom is on the main level, pt reports he occasionally uses the stairs as his gym is upstairs Has following equipment at home:  none  OCCUPATION: retired  LEISURE: pt reports he has not exercises much recently - less than once per week, pt was exercising 1-2 times per week prior to 3 months ago- has gym at home with stationary bike, weights, elliptical  HAND DOMINANCE : right   PRIOR LEVEL OF FUNCTION: Independent  PATIENT GOALS get stronger, get strength back   OBJECTIVE  COGNITION:  Overall cognitive status: Within functional limits for tasks assessed   SENSATION:  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, decreased sensation in bilateral great toe in bilateral feet to light touch, intact proprioception in great toe, no other area of impaired sensation  POSTURE: forward head and rounded shoulders   UPPER EXTREMITY STRENGTH:  grossly 5/5 in shoulder flexion, abduction, and biceps, 3+/5 in triceps   LOWER EXTREMITY STRENGTH:  STRENGTH Right eval  Hip flexion 4/5  Hip extension 4/5  Hip abduction 5/5  Hip adduction   Hip internal rotation   Hip external rotation   Knee flexion 5/5  Knee extension 5/5  Ankle dorsiflexion 5/5   Ankle plantarflexion   Ankle inversion   Ankle eversion    (Blank rows = not tested)  A/PROM LEFT eval  Hip flexion 5/5  Hip extension 4/5  Hip abduction 5/5  Hip adduction   Hip internal rotation   Hip external rotation   Knee flexion 4/5  Knee extension 5/5  Ankle dorsiflexion 5/5  Ankle plantarflexion   Ankle inversion   Ankle eversion    (Blank rows = not tested)  FUNCTIONAL TESTS:  30 seconds chair stand test: 16 which is between good and excellent for his age, pt had increased leg fatigue after this, he first did 11 but then felt he could have done them faster so retested SLS on R: 1st trial 1 sec, 2nd trial 2 sec, 3rd trial 4 sec SLS on L: 1st trial 2 sec, 2nd trial 3 sec, 3rd trial 2 sec  GAIT: Distance walked: 15 Assistive device utilized: None Level of assistance: Complete Independence Comments: some slight decreased foot clearance    TODAY'S TREATMENT  09/17/22 Therapeutic Exercises: NuStep level 6 x10 mins with therapist monitoring pt throughout for fatigue (seat 13, UE 11) - 833 steps Seated edge of mat table for bil HS stretch and bil piriformis figure 4 stretch 2x, 20-30 sec holds; then standing at side of // bars for quad stretch by grabbing ankle behind him at glut 2x, 20 sec each  3 way hip using hip machine on ortho side 12 reps in direction of flexion, abduction and extension with 70 lbs Leg Press Machine 130# 1x 20  Supine on mat table for pelvic tilt x 10 reps with 5 sec holds, pelvic tilts in 90-90 position with heel taps x 10 reps  Neuro Re-ed In // bars: tandem walking forwards and backwards Standing in // bars with colored circles and small cones on top in a circle around him and having to step on color called out by therapist and tap with foot without crushing cone with pt reporting challenge with this Marching on air ex x 10, SLS on air ex x 4 reps, tandem stance on air ex x 4 reps with eyes open then eyes closed  3 way hip on airex x 10  reps in direction of flexion, abduction and ER with 4 lb ankle weights  09/15/22 Therapeutic Exercises: NuStep level 6 x10 mins with therapist monitoring pt throughout for fatigue (seat 13, UE 11) - 815 steps Seated edge of mat table for bil HS stretch and bil piriformis figure 4 stretch 2x, 20-30 sec holds; then standing at side of // bars for quad stretch by grabbing ankle behind him at glut 2x, 20 sec each returning therapist demo 3 way hip using hip machine on ortho side 10 reps in direction of flexion, abduction and extension with 70 lbs Leg Press Machine 130# 1x 20  Supine on mat table for pelvic tilt then with pelvic tilt for following: clams (open/close knees) 1x10, alt marching 1 x 10 Abdominal Reverse curl x10 Neuro Re-ed In // bars: Heel-toe front 4x and retro 4x each with occasional UE support; grapevine 4x each direction, pt demonstrating improved ability to cross over midline,  heel/toe walking on air ex x 4 with no UE support Then stepping over cones 4x with cues for high knees and working to decrease UE support throughout  Standing in // bars with colored circles and small cones on top in a circle around him and having to step on color called out by therapist and tap with foot without crushing cone with pt reporting challenge with this Marching on air ex x 10, heel raises on air ex x 10 reps, step ups on air ex x 10 reps  09/12/22 Therapeutic Exercises: NuStep level 6 x10 mins with therapist monitoring pt throughout for fatigue (seat 12, UE 11) - 795 steps Seated edge of mat table for bil HS stretch and bil piriformis figure 4 stretch 2x, 20-30 sec holds; then standing at side of // bars for quad stretch by grabbing ankle behind him at glut 2x, 20 sec each returning therapist demo 3 way hip using hip machine on ortho side 10 reps in direction of flexion, abduction and extension with 55 lbs Leg Press Machine 110# 1x 20  Supine on mat table for pelvic tilt then with pelvic tilt  for following: clams (open/close knees) 1x10, alt marching 1 x 10 Abdominal Reverse curl x10 Seated EOB for alt LAQs 3# 1 set of 10 each, 2-3 sec holds Neuro Re-ed In // bars: Heel-toe front 4x and retro 4x each with no UE support; grapevine 2x each direction, VCs to completely cross over midline, pt used bilateral UE support; heel/toe walking on air ex x 4 with pt reporting increased challenge Then stepping over cones 4x with cues for high knees and working to decrease UE support throughout   09/08/22 Therapeutic Exercises: NuStep level 6 x10 mins with therapist monitoring pt throughout for fatigue (seat 12, UE 11) Seated edge of mat table for bil HS stretch and bil piriformis figure 4 stretch 2x, 20-30 sec holds; then standing at side of // bars for quad stretch by grabbing ankle behind him at glut 2x, 20 sec each returning therapist demo Supine on mat table for pelvic tilt then with pelvic tilt for following: clams (open/close knees) 2 x 5, 3# on each ankle for following: alt marching 1 x 10 Abdominal Reverse curl x10 Seated EOB for alt LAQs 3# 1 set of 10 each, 2-3 sec holds, pt reports feeling LE fatigue as this was last exercise Neuro Re-ed In // bars with 3# added to ankles: Heel-toe front 4x and retro 4x each with no UE support, slow  and controlled high knee marching with 3#  x4 with no UE support; grapevine 2x each direction, VCs to completely cross over midline, pt used bilateral UE support; next standing on air ex for bil LE 3 way raises into flex, ext, and abd x12 each returning therapist demo; tactile cues to prevent forward trunk lean with ext and v/c to keep core engaged Then stepping over cones 4x with cues for high knees and working to decrease UE support throughout  Leg Press Machine 9100# 2 x 15, pt with improved technique today  09/03/22 Therapeutic Exercises: NuStep level 6 x10 mins with therapist monitoring pt throughout for fatigue (seat 12, UE 11) Then after Neuro Re  ed Seated edge of mat table for bil HS stretch and bil piriformis figure 4 stretch 2x, 20-30 sec holds; then standing at side of // bars for quad stretch by grabbing ankle behind him at glut 2x, 20 sec each returning therapist demo Supine on mat table for pelvic tilt then with pelvic tilt for following: clams (open/close knees) 2 x 5, 3# on each ankle for following: alt marching 2 x 5, then LAQ's 2 x 5 returning therapist demo for each  Abdominal Reverse curl x10 Leg Press Machine 90# 2 x 15, pt with improved technique today Seated EOB for alt LAQs 10# 2x5 each, 2-3 sec holds, pt reports feeling LE fatigue as this was last exercise Neuro Re-ed In // bars with 3# added to ankles: Heel-toe front 4x and retro 2x each, slow and controlled high knee marching x4; grapevine 2x each direction, VCs to completely cross over midline; next standing on blue oval for bil LE 3 way raises into flex, ext, and abd x12 each returning therapist demo; tactile cues to prevent forward trunk lean with ext Then stepping over cones 4x with cues for high knees and working to decrease UE support throughout   09/01/22 Therapeutic Exercises: NuStep level 5 x10 mins with therapist monitoring pt throughout for fatigue Supine on mat table (after neuro re ed) for pelvic tilt then with pelvic tilt for following: clams (open/close knees) 2 x 5, alt marching 2 x 5, then LAQ's 2 x 5 returning therapist demo for each  Reverse curl x10 with demo Leg Press Machine 80# 2 x 15, VCs to keep feet in neutral and shoulder width apart, also to push thru heels, not toes Seated EOB for alt LAQs 10# x10 each with cuing to hold 2-3 sec per rep Neuro Re-ed In // bars: Heel-toe front and retro x4 each, slow and controlled high knee marching x4 (pt struggled with posterior lean); grapevine 2x each direction returning therapist demo for each and VCs throughout for erect posture; seated rest Then stepping over cones 4x with cues for high knees and  working to decrease UE support throughout Standing in corner on Airex for purple ball throw multi directional x1 min   08/27/22- educated pt to begin using stationary bike and elliptical at home and slowly work up time doing cardio, start around 5 min; practice sit to stands for an exercise  PATIENT EDUCATION:  Education details: Bil LE hip and core strength Person educated: Patient Education method: Explanation, demonstration, tactile and VCs, handouts issued Education comprehension: verbalized understanding, returned demonstration, needs further review   HOME EXERCISE PROGRAM: Use treadmill/stationary bike at home - start with just a few min and work up Sit to stands  Access Code: [email or web address] URL: [email or web address] Date: 09/17/2022 Prepared by: Manus Gunning  Exercises - Single Leg  Stance on Foam Pad  - 1 x daily - 7 x weekly - 1 sets - 3 reps - 30 sec hold - Tandem Stance on Foam Pad with Eyes Open  - 1 x daily - 7 x weekly - 1 sets - 3-5 reps - 30 sec hold - Tandem Stance on Foam Pad with Eyes Closed  - 1 x daily - 7 x weekly - 1 sets - 3-5 reps - 30 sec hold - Marching with High Knee Taps  - 1 x daily - 7 x weekly - 1 sets - 10 reps - Standing Repeated Hip Flexion on Foam Pad  - 1 x daily - 7 x weekly - 1 sets - 10 reps - Standing Repeated Hip Extension on Foam Pad  - 1 x daily - 7 x weekly - 1 sets - 10 reps - Standing Repeated Hip Extension on Foam Pad  - 1 x daily - 7 x weekly - 1 sets - 10 reps - Seated Long Arc Quad with Ankle Weight  - 1 x daily - 7 x weekly - 1 sets - 10 reps - 3 sec  hold - Supine Posterior Pelvic Tilt  - 1 x daily - 7 x weekly - 1 sets - 10 reps - 3=5 sec hold - Supine 90/90 Alternating Heel Touches with Posterior Pelvic Tilt  - 1 x daily - 7 x weekly - 1 sets - 10 reps - Walking Tandem Stance  - 1 x daily - 7 x weekly - 1 sets - 10 reps - Backward Tandem Walking  - 1 x daily - 7 x weekly - 1 sets - 10  reps  ASSESSMENT:  CLINICAL IMPRESSION: Created a new home exercise program for pt with high level balance activities, strengthening for hip and core. Instructed pt in all exercises in the program and had him return demonstrate each. Continued with LE strengthening this session and high level balance exercises. Pt felt a challenge with tandem stance on foam with eyes closed and circle taps.    OBJECTIVE IMPAIRMENTS decreased activity tolerance, decreased balance, decreased endurance, difficulty walking, decreased strength, impaired sensation, and postural dysfunction.   ACTIVITY LIMITATIONS carrying and lifting  PARTICIPATION LIMITATIONS: community activity and yard work  PERSONAL FACTORS  none  are also affecting patient's functional outcome.   REHAB POTENTIAL: Good  CLINICAL DECISION MAKING: Stable/uncomplicated  EVALUATION COMPLEXITY: Low  GOALS: Goals reviewed with patient? Yes  SHORT TERM GOALS=LONG TERM GOALS Target date: 09/24/2022    Pt will be able to do SLS for at least 15 sec bilaterally to allow him to don his pants with increased ease. Baseline: 2 sec bilat Goal status: MET 09/15/22 R 17 sec, L 21 sec  2.  Pt will be independent in a home exercise program for continued stretching and strengthening.  Baseline:  Goal status: INITIAL  3.  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  Baseline:  Goal status: MET 09/15/22 50% improvement  4.  Pt will improve R hip flexion strength to 5/5 to decrease fall risk. Baseline: 4/5 Goal status: MET 09/15/22 - 5/5   PLAN: PT FREQUENCY: 2x/week  PT DURATION: 4 weeks  PLANNED INTERVENTIONS: Therapeutic exercises, Therapeutic activity, Neuromuscular re-education, Balance training, Gait training, Patient/Family education, Self Care, and Manual therapy  PLAN FOR NEXT SESSION: Cont high level balance exercises and machines for strengthening; how are new  exercises?   Allyson Sabal [place], PT 09/17/2022, 12:56 PM

## 2022-09-22 ENCOUNTER — Encounter: Payer: Self-pay | Admitting: Physical Therapy

## 2022-09-22 ENCOUNTER — Ambulatory Visit: Payer: Medicare Other | Admitting: Physical Therapy

## 2022-09-22 DIAGNOSIS — C9001 Multiple myeloma in remission: Secondary | ICD-10-CM

## 2022-09-22 DIAGNOSIS — M6281 Muscle weakness (generalized): Secondary | ICD-10-CM

## 2022-09-22 DIAGNOSIS — R208 Other disturbances of skin sensation: Secondary | ICD-10-CM

## 2022-09-22 DIAGNOSIS — R262 Difficulty in walking, not elsewhere classified: Secondary | ICD-10-CM

## 2022-09-22 NOTE — Therapy (Signed)
OUTPATIENT PHYSICAL THERAPY ONCOLOGY TREATMENT   Patient Name: Nathan Martinez MRN: [id number] DOB:1946/04/02, 76 y.o., male Today's Date: 09/22/2022   PT End of Session - 09/22/22 1203     Visit Number 8    Number of Visits 9    Date for PT Re-Evaluation 09/24/22    PT Start Time 1202    PT Stop Time 1250    PT Time Calculation (min) 48 min    Activity Tolerance Patient tolerated treatment well    Behavior During Therapy [facility] for tasks assessed/performed               Past Medical History:  Diagnosis Date   Hypertension    Past Surgical History:  Procedure Laterality Date   KNEE SURGERY Right 2018   PARATHYROID EXPLORATION     Patient Active Problem List   Diagnosis Date Noted   Multiple myeloma not having achieved remission ([place]) 04/16/2022   Multiple myeloma ([place]) 01/24/2022   Counseling regarding advance care planning and goals of care 01/24/2022    PCP: Merri Ray, MD  REFERRING PROVIDER: [name], MD  REFERRING DIAG: C90.01 (ICD-10-CM) - Multiple myeloma in remission ([place]) R53.83 (ICD-10-CM) - Fatigue, unspecified type   THERAPY DIAG:  Muscle weakness (generalized)  Other disturbances of skin sensation  Difficulty in walking, not elsewhere classified  Multiple myeloma in remission [facility])  ONSET DATE: 06/24/22  Rationale for Evaluation and Treatment Rehabilitation  SUBJECTIVE                                                                                                                                                                                           SUBJECTIVE STATEMENT: I felt good after last session. Later in the evening I got a little tired.   PERTINENT HISTORY:  Recently diagnosed with acute DVT of the right proximal, mid and distal femoral vein, right popliteal vein and right peroneal veins.  Noted on ultrasound September 26 started on Eliquis. Active myeloma with presence of bone lesions (one on rib and one on vertebrae)  on PET scan with recent remission. 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 history of hypertension, dyslipidemia, glaucoma, GERD, previous Helicobacter pylori infection with  PAIN:  Are you having pain? No   PRECAUTIONS: Other: R TKA 2018, multiple myeloma in remission    WEIGHT BEARING RESTRICTIONS No  FALLS:  Has patient fallen in last 6 months? Yes. Number of falls turned quickly and stepped on a shoe and fell in the middle of the night  when half asleep  LIVING ENVIRONMENT: Lives with: lives with their spouse [name] Lives in: House/apartment Stairs: Yes; their bedroom is on the main level, pt reports he occasionally uses the stairs as his gym is upstairs Has following equipment at home:  none  OCCUPATION: retired  LEISURE: pt reports he has not exercises much recently - less than once per week, pt was exercising 1-2 times per week prior to 3 months ago- has gym at home with stationary bike, weights, elliptical  HAND DOMINANCE : right   PRIOR LEVEL OF FUNCTION: Independent  PATIENT GOALS get stronger, get strength back   OBJECTIVE  COGNITION:  Overall cognitive status: Within functional limits for tasks assessed   SENSATION:  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, decreased sensation in bilateral great toe in bilateral feet to light touch, intact proprioception in great toe, no other area of impaired sensation  POSTURE: forward head and rounded shoulders   UPPER EXTREMITY STRENGTH:  grossly 5/5 in shoulder flexion, abduction, and biceps, 3+/5 in triceps   LOWER EXTREMITY STRENGTH:  STRENGTH Right eval  Hip flexion 4/5  Hip extension 4/5  Hip abduction 5/5  Hip adduction   Hip internal rotation   Hip external rotation   Knee flexion 5/5  Knee extension 5/5  Ankle dorsiflexion 5/5   Ankle plantarflexion   Ankle inversion   Ankle eversion    (Blank rows = not tested)  A/PROM LEFT eval  Hip flexion 5/5  Hip extension 4/5  Hip abduction 5/5  Hip adduction   Hip internal rotation   Hip external rotation   Knee flexion 4/5  Knee extension 5/5  Ankle dorsiflexion 5/5  Ankle plantarflexion   Ankle inversion   Ankle eversion    (Blank rows = not tested)  FUNCTIONAL TESTS:  30 seconds chair stand test: 16 which is between good and excellent for his age, pt had increased leg fatigue after this, he first did 11 but then felt he could have done them faster so retested SLS on R: 1st trial 1 sec, 2nd trial 2 sec, 3rd trial 4 sec SLS on L: 1st trial 2 sec, 2nd trial 3 sec, 3rd trial 2 sec  GAIT: Distance walked: 15 Assistive device utilized: None Level of assistance: Complete Independence Comments: some slight decreased foot clearance    TODAY'S TREATMENT  09/22/22 Therapeutic Exercises: NuStep level 6 x10 mins with therapist monitoring pt throughout for fatigue (seat 13, UE 11) - 847 steps Seated edge of mat table for bil HS stretch and bil piriformis figure 4 stretch 2x, 20-30 sec holds; then standing at side of // bars for quad stretch by grabbing ankle behind him at glut 2x, 20 sec each  3 way hip using hip machine on ortho side 15 reps in direction of flexion, abduction and extension with 85 lbs Leg Press Machine 130# 1x 20  Supine on mat table for pelvic tilt x 10 reps with 5 sec holds, pelvic tilts in 90-90 position with heel taps x 10 reps, pelvic tilt x 10 with hip abduction  Seated edge of mat for LAQ with 4lb ankle weights x 10 reps with 5 sec holds  Neuro Re-ed In // bars: heel toe walking x 4 on air ex, side stepping x 4 on air ex Standing in // bars with colored circles and small cones on top in a circle around him and having to step on color called out by therapist and tap with foot without  crushing cone with pt reporting challenge with  this Marching on air ex x 10, heel raises x 10 reps each, SLS on air ex x 4 reps- 2x 30 sec on L, 15 sec on R x 2, tandem stance on air ex x 4 reps with eyes open then eyes closed - very difficult     09/17/22 Therapeutic Exercises: NuStep level 6 x10 mins with therapist monitoring pt throughout for fatigue (seat 13, UE 11) - 833 steps Seated edge of mat table for bil HS stretch and bil piriformis figure 4 stretch 2x, 20-30 sec holds; then standing at side of // bars for quad stretch by grabbing ankle behind him at glut 2x, 20 sec each  3 way hip using hip machine on ortho side 12 reps in direction of flexion, abduction and extension with 70 lbs Leg Press Machine 130# 1x 20  Supine on mat table for pelvic tilt x 10 reps with 5 sec holds, pelvic tilts in 90-90 position with heel taps x 10 reps  Neuro Re-ed In // bars: tandem walking forwards and backwards Standing in // bars with colored circles and small cones on top in a circle around him and having to step on color called out by therapist and tap with foot without crushing cone with pt reporting challenge with this Marching on air ex x 10, SLS on air ex x 4 reps, tandem stance on air ex x 4 reps with eyes open then eyes closed 3 way hip on airex x 10 reps in direction of flexion, abduction and ER with 4 lb ankle weights  09/15/22 Therapeutic Exercises: NuStep level 6 x10 mins with therapist monitoring pt throughout for fatigue (seat 13, UE 11) - 815 steps Seated edge of mat table for bil HS stretch and bil piriformis figure 4 stretch 2x, 20-30 sec holds; then standing at side of // bars for quad stretch by grabbing ankle behind him at glut 2x, 20 sec each returning therapist demo 3 way hip using hip machine on ortho side 10 reps in direction of flexion, abduction and extension with 70 lbs Leg Press Machine 130# 1x 20  Supine on mat table for pelvic tilt then with pelvic tilt for following: clams (open/close knees) 1x10, alt marching 1 x  10 Abdominal Reverse curl x10  Neuro Re-ed In // bars: Heel-toe front 4x and retro 4x each with occasional UE support; grapevine 4x each direction, pt demonstrating improved ability to cross over midline,  heel/toe walking on air ex x 4 with no UE support Then stepping over cones 4x with cues for high knees and working to decrease UE support throughout  Standing in // bars with colored circles and small cones on top in a circle around him and having to step on color called out by therapist and tap with foot without crushing cone with pt reporting challenge with this Marching on air ex x 10, heel raises on air ex x 10 reps, step ups on air ex x 10 reps  09/12/22 Therapeutic Exercises: NuStep level 6 x10 mins with therapist monitoring pt throughout for fatigue (seat 12, UE 11) - 795 steps Seated edge of mat table for bil HS stretch and bil piriformis figure 4 stretch 2x, 20-30 sec holds; then standing at side of // bars for quad stretch by grabbing ankle behind him at glut 2x, 20 sec each returning therapist demo 3 way hip using hip machine on ortho side 10 reps in direction of flexion, abduction and extension  with 55 lbs Leg Press Machine 110# 1x 20  Supine on mat table for pelvic tilt then with pelvic tilt for following: clams (open/close knees) 1x10, alt marching 1 x 10 Abdominal Reverse curl x10 Seated EOB for alt LAQs 3# 1 set of 10 each, 2-3 sec holds Neuro Re-ed In // bars: Heel-toe front 4x and retro 4x each with no UE support; grapevine 2x each direction, VCs to completely cross over midline, pt used bilateral UE support; heel/toe walking on air ex x 4 with pt reporting increased challenge Then stepping over cones 4x with cues for high knees and working to decrease UE support throughout   09/08/22 Therapeutic Exercises: NuStep level 6 x10 mins with therapist monitoring pt throughout for fatigue (seat 12, UE 11) Seated edge of mat table for bil HS stretch and bil piriformis figure 4  stretch 2x, 20-30 sec holds; then standing at side of // bars for quad stretch by grabbing ankle behind him at glut 2x, 20 sec each returning therapist demo Supine on mat table for pelvic tilt then with pelvic tilt for following: clams (open/close knees) 2 x 5, 3# on each ankle for following: alt marching 1 x 10 Abdominal Reverse curl x10 Seated EOB for alt LAQs 3# 1 set of 10 each, 2-3 sec holds, pt reports feeling LE fatigue as this was last exercise Neuro Re-ed In // bars with 3# added to ankles: Heel-toe front 4x and retro 4x each with no UE support, slow and controlled high knee marching with 3#  x4 with no UE support; grapevine 2x each direction, VCs to completely cross over midline, pt used bilateral UE support; next standing on air ex for bil LE 3 way raises into flex, ext, and abd x12 each returning therapist demo; tactile cues to prevent forward trunk lean with ext and v/c to keep core engaged Then stepping over cones 4x with cues for high knees and working to decrease UE support throughout  Leg Press Machine 9100# 2 x 15, pt with improved technique today  09/03/22 Therapeutic Exercises: NuStep level 6 x10 mins with therapist monitoring pt throughout for fatigue (seat 12, UE 11) Then after Neuro Re ed Seated edge of mat table for bil HS stretch and bil piriformis figure 4 stretch 2x, 20-30 sec holds; then standing at side of // bars for quad stretch by grabbing ankle behind him at glut 2x, 20 sec each returning therapist demo Supine on mat table for pelvic tilt then with pelvic tilt for following: clams (open/close knees) 2 x 5, 3# on each ankle for following: alt marching 2 x 5, then LAQ's 2 x 5 returning therapist demo for each  Abdominal Reverse curl x10 Leg Press Machine 90# 2 x 15, pt with improved technique today Seated EOB for alt LAQs 10# 2x5 each, 2-3 sec holds, pt reports feeling LE fatigue as this was last exercise Neuro Re-ed In // bars with 3# added to ankles: Heel-toe front  4x and retro 2x each, slow and controlled high knee marching x4; grapevine 2x each direction, VCs to completely cross over midline; next standing on blue oval for bil LE 3 way raises into flex, ext, and abd x12 each returning therapist demo; tactile cues to prevent forward trunk lean with ext Then stepping over cones 4x with cues for high knees and working to decrease UE support throughout   09/01/22 Therapeutic Exercises: NuStep level 5 x10 mins with therapist monitoring pt throughout for fatigue Supine on mat table (after neuro  re ed) for pelvic tilt then with pelvic tilt for following: clams (open/close knees) 2 x 5, alt marching 2 x 5, then LAQ's 2 x 5 returning therapist demo for each  Reverse curl x10 with demo Leg Press Machine 80# 2 x 15, VCs to keep feet in neutral and shoulder width apart, also to push thru heels, not toes Seated EOB for alt LAQs 10# x10 each with cuing to hold 2-3 sec per rep Neuro Re-ed In // bars: Heel-toe front and retro x4 each, slow and controlled high knee marching x4 (pt struggled with posterior lean); grapevine 2x each direction returning therapist demo for each and VCs throughout for erect posture; seated rest Then stepping over cones 4x with cues for high knees and working to decrease UE support throughout Standing in corner on [place] for purple ball throw multi directional x1 min   08/27/22- educated pt to begin using stationary bike and elliptical at home and slowly work up time doing cardio, start around 5 min; practice sit to stands for an exercise  PATIENT EDUCATION:  Education details: Bil LE hip and core strength Person educated: Patient Education method: Explanation, demonstration, tactile and VCs, handouts issued Education comprehension: verbalized understanding, returned demonstration, needs further review   HOME EXERCISE PROGRAM: Use treadmill/stationary bike at home - start with just a few min and work up Sit to stands  Access Code:  [email or web address] URL: [email or web address] Date: 09/17/2022 Prepared by: Manus Gunning  Exercises - Single Leg Stance on Foam Pad  - 1 x daily - 7 x weekly - 1 sets - 3 reps - 30 sec hold - Tandem Stance on Foam Pad with Eyes Open  - 1 x daily - 7 x weekly - 1 sets - 3-5 reps - 30 sec hold - Tandem Stance on Foam Pad with Eyes Closed  - 1 x daily - 7 x weekly - 1 sets - 3-5 reps - 30 sec hold - Marching with High Knee Taps  - 1 x daily - 7 x weekly - 1 sets - 10 reps - Standing Repeated Hip Flexion on Foam Pad  - 1 x daily - 7 x weekly - 1 sets - 10 reps - Standing Repeated Hip Extension on Foam Pad  - 1 x daily - 7 x weekly - 1 sets - 10 reps - Standing Repeated Hip Extension on Foam Pad  - 1 x daily - 7 x weekly - 1 sets - 10 reps - Seated Long Arc Quad with Ankle Weight  - 1 x daily - 7 x weekly - 1 sets - 10 reps - 3 sec  hold - Supine Posterior Pelvic Tilt  - 1 x daily - 7 x weekly - 1 sets - 10 reps - 3=5 sec hold - Supine 90/90 Alternating Heel Touches with Posterior Pelvic Tilt  - 1 x daily - 7 x weekly - 1 sets - 10 reps - Walking Tandem Stance  - 1 x daily - 7 x weekly - 1 sets - 10 reps - Backward Tandem Walking  - 1 x daily - 7 x weekly - 1 sets - 10 reps  ASSESSMENT:  CLINICAL IMPRESSION: Pt continues to demonstrate improvements in therapy. He is tolerating increased reps and resistance of all exercises. He is still challenged by high level balance exercises especially those with eyes closed on soft surfaces. His ability to do single limb stance has improved greatly since start of PT. Will plan to assess his progress towards goals  at next session and if he has met all goals will d/c.    OBJECTIVE IMPAIRMENTS decreased activity tolerance, decreased balance, decreased endurance, difficulty walking, decreased strength, impaired sensation, and postural dysfunction.   ACTIVITY LIMITATIONS carrying and lifting  PARTICIPATION LIMITATIONS: community activity and yard  work  PERSONAL FACTORS  none  are also affecting patient's functional outcome.   REHAB POTENTIAL: Good  CLINICAL DECISION MAKING: Stable/uncomplicated  EVALUATION COMPLEXITY: Low  GOALS: Goals reviewed with patient? Yes  SHORT TERM GOALS=LONG TERM GOALS Target date: 09/24/2022    Pt will be able to do SLS for at least 15 sec bilaterally to allow him to don his pants with increased ease. Baseline: 2 sec bilat Goal status: MET 09/15/22 R 17 sec, L 21 sec  2.  Pt will be independent in a home exercise program for continued stretching and strengthening.  Baseline:  Goal status: INITIAL  3.  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  Baseline:  Goal status: MET 09/15/22 50% improvement  4.  Pt will improve R hip flexion strength to 5/5 to decrease fall risk. Baseline: 4/5 Goal status: MET 09/15/22 - 5/5   PLAN: PT FREQUENCY: 2x/week  PT DURATION: 4 weeks  PLANNED INTERVENTIONS: Therapeutic exercises, Therapeutic activity, Neuromuscular re-education, Balance training, Gait training, Patient/Family education, Self Care, and Manual therapy  PLAN FOR NEXT SESSION: assess progress towards goals and d/c?   Allyson Sabal [place], PT 09/22/2022, 12:57 PM

## 2022-09-24 ENCOUNTER — Ambulatory Visit: Payer: Medicare Other | Attending: Hematology | Admitting: Physical Therapy

## 2022-09-24 ENCOUNTER — Encounter: Payer: Self-pay | Admitting: Physical Therapy

## 2022-09-24 DIAGNOSIS — M6281 Muscle weakness (generalized): Secondary | ICD-10-CM | POA: Insufficient documentation

## 2022-09-24 DIAGNOSIS — R208 Other disturbances of skin sensation: Secondary | ICD-10-CM | POA: Insufficient documentation

## 2022-09-24 DIAGNOSIS — R262 Difficulty in walking, not elsewhere classified: Secondary | ICD-10-CM | POA: Diagnosis present

## 2022-09-24 DIAGNOSIS — C9001 Multiple myeloma in remission: Secondary | ICD-10-CM | POA: Insufficient documentation

## 2022-09-24 NOTE — Therapy (Signed)
OUTPATIENT PHYSICAL THERAPY ONCOLOGY TREATMENT   Patient Name: Nathan Martinez MRN: [id number] DOB:1946/08/25, 76 y.o., male Today's Date: 09/24/2022   PT End of Session - 09/24/22 1109     Visit Number 9    Number of Visits 9    Date for PT Re-Evaluation 09/24/22    PT Start Time 1106    PT Stop Time 1145    PT Time Calculation (min) 39 min    Activity Tolerance Patient tolerated treatment well    Behavior During Therapy [facility] for tasks assessed/performed                Past Medical History:  Diagnosis Date   Hypertension    Past Surgical History:  Procedure Laterality Date   KNEE SURGERY Right 2018   PARATHYROID EXPLORATION     Patient Active Problem List   Diagnosis Date Noted   Multiple myeloma not having achieved remission ([place]) 04/16/2022   Multiple myeloma ([place]) 01/24/2022   Counseling regarding advance care planning and goals of care 01/24/2022    PCP: Merri Ray, MD  REFERRING PROVIDER: [name], MD  REFERRING DIAG: C90.01 (ICD-10-CM) - Multiple myeloma in remission ([place]) R53.83 (ICD-10-CM) - Fatigue, unspecified type   THERAPY DIAG:  Muscle weakness (generalized)  Other disturbances of skin sensation  Difficulty in walking, not elsewhere classified  Multiple myeloma in remission [facility])  ONSET DATE: 06/24/22  Rationale for Evaluation and Treatment Rehabilitation  SUBJECTIVE                                                                                                                                                                                           SUBJECTIVE STATEMENT: When I left here and played 18 holes of golf.   PERTINENT HISTORY:  Recently diagnosed with acute DVT of the right proximal, mid and distal femoral vein, right popliteal vein and right peroneal veins.  Noted on ultrasound September 26 started on Eliquis. Active myeloma with presence of bone lesions (one on rib and one on vertebrae) on PET scan with recent  remission. 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 history of hypertension, dyslipidemia, glaucoma, GERD, previous Helicobacter pylori infection with  PAIN:  Are you having pain? No   PRECAUTIONS: Other: R TKA 2018, multiple myeloma in remission    WEIGHT BEARING RESTRICTIONS No  FALLS:  Has patient fallen in last 6 months? Yes. Number of falls turned quickly and stepped on a shoe and fell in the middle of the night when half asleep  LIVING ENVIRONMENT: Lives with: lives with their spouse [name] Lives in: House/apartment Stairs: Yes; their bedroom is on the main level, pt reports he occasionally uses the stairs as his gym is upstairs Has following equipment at home:  none  OCCUPATION: retired  LEISURE: pt reports he has not exercises much recently - less than once per week, pt was exercising 1-2 times per week prior to 3 months ago- has gym at home with stationary bike, weights, elliptical  HAND DOMINANCE : right   PRIOR LEVEL OF FUNCTION: Independent  PATIENT GOALS get stronger, get strength back   OBJECTIVE  COGNITION:  Overall cognitive status: Within functional limits for tasks assessed   SENSATION:  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, decreased sensation in bilateral great toe in bilateral feet to light touch, intact proprioception in great toe, no other area of impaired sensation  POSTURE: forward head and rounded shoulders   UPPER EXTREMITY STRENGTH:  grossly 5/5 in shoulder flexion, abduction, and biceps, 3+/5 in triceps   LOWER EXTREMITY STRENGTH:  STRENGTH Right eval  Hip flexion 4/5  Hip extension 4/5  Hip abduction 5/5  Hip adduction   Hip internal rotation   Hip external rotation   Knee flexion 5/5  Knee extension 5/5  Ankle dorsiflexion 5/5  Ankle plantarflexion    Ankle inversion   Ankle eversion    (Blank rows = not tested)  A/PROM LEFT eval  Hip flexion 5/5  Hip extension 4/5  Hip abduction 5/5  Hip adduction   Hip internal rotation   Hip external rotation   Knee flexion 4/5  Knee extension 5/5  Ankle dorsiflexion 5/5  Ankle plantarflexion   Ankle inversion   Ankle eversion    (Blank rows = not tested)  FUNCTIONAL TESTS:  30 seconds chair stand test: 16 which is between good and excellent for his age, pt had increased leg fatigue after this, he first did 11 but then felt he could have done them faster so retested SLS on R: 1st trial 1 sec, 2nd trial 2 sec, 3rd trial 4 sec SLS on L: 1st trial 2 sec, 2nd trial 3 sec, 3rd trial 2 sec  GAIT: Distance walked: 15 Assistive device utilized: None Level of assistance: Complete Independence Comments: some slight decreased foot clearance    TODAY'S TREATMENT  09/24/22 Therapeutic Exercises: NuStep level 6 x10 mins with therapist monitoring pt throughout for fatigue (seat 13, UE 11) - 860 steps Seated edge of mat table for bil HS stretch and bil piriformis figure 4 stretch 2x, 20-30 sec holds; then standing at side of // bars for quad stretch by grabbing ankle behind him at glut 2x, 20 sec each  3 way hip using hip machine on ortho side 15 reps in direction of flexion, abduction and extension with 85 lbs Leg Press Machine 130# 1x 10, 150# 1x10  Supine on mat table for pelvic tilt x 10 reps with 5 sec holds, pelvic tilts in 90-90 position with heel taps x 10 reps, pelvic tilt x 10 with hip abduction v/c to prevent hip movement  Seated edge of mat for LAQ with 4lb ankle weights x 10 reps with 5 sec holds  Neuro Re-ed In // bars: heel toe walking x 4 on air ex, Marching on air ex x 10, heel raises x 10 reps each, SLS on air ex x 4 reps with eyes open and then eyes closed-, standing on air ex with eyes open then eyes closed,  ,  tandem stance on air ex x 4 reps with eyes open then eyes closed      09/22/22 Therapeutic Exercises: NuStep level 6 x10 mins with therapist monitoring pt throughout for fatigue (seat 13, UE 11) - 847 steps Seated edge of mat table for bil HS stretch and bil piriformis figure 4 stretch 2x, 20-30 sec holds; then standing at side of // bars for quad stretch by grabbing ankle behind him at glut 2x, 20 sec each  3 way hip using hip machine on ortho side 15 reps in direction of flexion, abduction and extension with 85 lbs Leg Press Machine 130# 1x 20  Supine on mat table for pelvic tilt x 10 reps with 5 sec holds, pelvic tilts in 90-90 position with heel taps x 10 reps, pelvic tilt x 10 with hip abduction  Seated edge of mat for LAQ with 4lb ankle weights x 10 reps with 5 sec holds  Neuro Re-ed In // bars: heel toe walking x 4 on air ex, side stepping x 4 on air ex Standing in // bars with colored circles and small cones on top in a circle around him and having to step on color called out by therapist and tap with foot without crushing cone with pt reporting challenge with this Marching on air ex x 10, heel raises x 10 reps each, SLS on air ex x 4 reps- 2x 30 sec on L, 15 sec on R x 2, tandem stance on air ex x 4 reps with eyes open then eyes closed - very difficult     09/17/22 Therapeutic Exercises: NuStep level 6 x10 mins with therapist monitoring pt throughout for fatigue (seat 13, UE 11) - 833 steps Seated edge of mat table for bil HS stretch and bil piriformis figure 4 stretch 2x, 20-30 sec holds; then standing at side of // bars for quad stretch by grabbing ankle behind him at glut 2x, 20 sec each  3 way hip using hip machine on ortho side 12 reps in direction of flexion, abduction and extension with 70 lbs Leg Press Machine 130# 1x 20  Supine on mat table for pelvic tilt x 10 reps with 5 sec holds, pelvic tilts in 90-90 position with heel taps x 10 reps  Neuro Re-ed In // bars: tandem walking forwards and backwards Standing in // bars with colored  circles and small cones on top in a circle around him and having to step on color called out by therapist and tap with foot without crushing cone with pt reporting challenge with this Marching on air ex x 10, SLS on air ex x 4 reps, tandem stance on air ex x 4 reps with eyes open then eyes closed 3 way hip on airex x 10 reps in direction of flexion, abduction and ER with 4 lb ankle weights  09/15/22 Therapeutic Exercises: NuStep level 6 x10 mins with therapist monitoring pt throughout for fatigue (seat 13, UE 11) - 815 steps Seated edge of mat table for bil HS stretch and bil piriformis figure 4 stretch 2x, 20-30 sec holds; then standing at side of // bars for quad stretch by grabbing ankle behind him at glut 2x, 20 sec each returning therapist demo 3 way hip using hip machine on ortho side 10 reps in direction of flexion, abduction and extension with 70 lbs Leg Press Machine 130# 1x 20  Supine on mat table for pelvic tilt then with pelvic tilt for following: clams (open/close knees) 1x10, alt marching  1 x 10 Abdominal Reverse curl x10  Neuro Re-ed In // bars: Heel-toe front 4x and retro 4x each with occasional UE support; grapevine 4x each direction, pt demonstrating improved ability to cross over midline,  heel/toe walking on air ex x 4 with no UE support Then stepping over cones 4x with cues for high knees and working to decrease UE support throughout  Standing in // bars with colored circles and small cones on top in a circle around him and having to step on color called out by therapist and tap with foot without crushing cone with pt reporting challenge with this Marching on air ex x 10, heel raises on air ex x 10 reps, step ups on air ex x 10 reps  09/12/22 Therapeutic Exercises: NuStep level 6 x10 mins with therapist monitoring pt throughout for fatigue (seat 12, UE 11) - 795 steps Seated edge of mat table for bil HS stretch and bil piriformis figure 4 stretch 2x, 20-30 sec holds; then  standing at side of // bars for quad stretch by grabbing ankle behind him at glut 2x, 20 sec each returning therapist demo 3 way hip using hip machine on ortho side 10 reps in direction of flexion, abduction and extension with 55 lbs Leg Press Machine 110# 1x 20  Supine on mat table for pelvic tilt then with pelvic tilt for following: clams (open/close knees) 1x10, alt marching 1 x 10 Abdominal Reverse curl x10 Seated EOB for alt LAQs 3# 1 set of 10 each, 2-3 sec holds Neuro Re-ed In // bars: Heel-toe front 4x and retro 4x each with no UE support; grapevine 2x each direction, VCs to completely cross over midline, pt used bilateral UE support; heel/toe walking on air ex x 4 with pt reporting increased challenge Then stepping over cones 4x with cues for high knees and working to decrease UE support throughout   09/08/22 Therapeutic Exercises: NuStep level 6 x10 mins with therapist monitoring pt throughout for fatigue (seat 12, UE 11) Seated edge of mat table for bil HS stretch and bil piriformis figure 4 stretch 2x, 20-30 sec holds; then standing at side of // bars for quad stretch by grabbing ankle behind him at glut 2x, 20 sec each returning therapist demo Supine on mat table for pelvic tilt then with pelvic tilt for following: clams (open/close knees) 2 x 5, 3# on each ankle for following: alt marching 1 x 10 Abdominal Reverse curl x10 Seated EOB for alt LAQs 3# 1 set of 10 each, 2-3 sec holds, pt reports feeling LE fatigue as this was last exercise Neuro Re-ed In // bars with 3# added to ankles: Heel-toe front 4x and retro 4x each with no UE support, slow and controlled high knee marching with 3#  x4 with no UE support; grapevine 2x each direction, VCs to completely cross over midline, pt used bilateral UE support; next standing on air ex for bil LE 3 way raises into flex, ext, and abd x12 each returning therapist demo; tactile cues to prevent forward trunk lean with ext and v/c to keep core  engaged Then stepping over cones 4x with cues for high knees and working to decrease UE support throughout  Leg Press Machine 9100# 2 x 15, pt with improved technique today  09/03/22 Therapeutic Exercises: NuStep level 6 x10 mins with therapist monitoring pt throughout for fatigue (seat 12, UE 11) Then after Neuro Re ed Seated edge of mat table for bil HS stretch and bil piriformis figure  4 stretch 2x, 20-30 sec holds; then standing at side of // bars for quad stretch by grabbing ankle behind him at glut 2x, 20 sec each returning therapist demo Supine on mat table for pelvic tilt then with pelvic tilt for following: clams (open/close knees) 2 x 5, 3# on each ankle for following: alt marching 2 x 5, then LAQ's 2 x 5 returning therapist demo for each  Abdominal Reverse curl x10 Leg Press Machine 90# 2 x 15, pt with improved technique today Seated EOB for alt LAQs 10# 2x5 each, 2-3 sec holds, pt reports feeling LE fatigue as this was last exercise Neuro Re-ed In // bars with 3# added to ankles: Heel-toe front 4x and retro 2x each, slow and controlled high knee marching x4; grapevine 2x each direction, VCs to completely cross over midline; next standing on blue oval for bil LE 3 way raises into flex, ext, and abd x12 each returning therapist demo; tactile cues to prevent forward trunk lean with ext Then stepping over cones 4x with cues for high knees and working to decrease UE support throughout   09/01/22 Therapeutic Exercises: NuStep level 5 x10 mins with therapist monitoring pt throughout for fatigue Supine on mat table (after neuro re ed) for pelvic tilt then with pelvic tilt for following: clams (open/close knees) 2 x 5, alt marching 2 x 5, then LAQ's 2 x 5 returning therapist demo for each  Reverse curl x10 with demo Leg Press Machine 80# 2 x 15, VCs to keep feet in neutral and shoulder width apart, also to push thru heels, not toes Seated EOB for alt LAQs 10# x10 each with cuing to hold 2-3  sec per rep Neuro Re-ed In // bars: Heel-toe front and retro x4 each, slow and controlled high knee marching x4 (pt struggled with posterior lean); grapevine 2x each direction returning therapist demo for each and VCs throughout for erect posture; seated rest Then stepping over cones 4x with cues for high knees and working to decrease UE support throughout Standing in corner on [place] for purple ball throw multi directional x1 min   08/27/22- educated pt to begin using stationary bike and elliptical at home and slowly work up time doing cardio, start around 5 min; practice sit to stands for an exercise  PATIENT EDUCATION:  Education details: Bil LE hip and core strength Person educated: Patient Education method: Explanation, demonstration, tactile and VCs, handouts issued Education comprehension: verbalized understanding, returned demonstration, needs further review   HOME EXERCISE PROGRAM: Use treadmill/stationary bike at home - start with just a few min and work up Sit to stands  Access Code: [email or web address] URL: [email or web address] Date: 09/17/2022 Prepared by: Manus Gunning  Exercises - Single Leg Stance on Foam Pad  - 1 x daily - 7 x weekly - 1 sets - 3 reps - 30 sec hold - Tandem Stance on Foam Pad with Eyes Open  - 1 x daily - 7 x weekly - 1 sets - 3-5 reps - 30 sec hold - Tandem Stance on Foam Pad with Eyes Closed  - 1 x daily - 7 x weekly - 1 sets - 3-5 reps - 30 sec hold - Marching with High Knee Taps  - 1 x daily - 7 x weekly - 1 sets - 10 reps - Standing Repeated Hip Flexion on Foam Pad  - 1 x daily - 7 x weekly - 1 sets - 10 reps - Standing Repeated Hip Extension on Foam Pad  -  1 x daily - 7 x weekly - 1 sets - 10 reps - Standing Repeated Hip Extension on Foam Pad  - 1 x daily - 7 x weekly - 1 sets - 10 reps - Seated Long Arc Quad with Ankle Weight  - 1 x daily - 7 x weekly - 1 sets - 10 reps - 3 sec  hold - Supine Posterior Pelvic Tilt  - 1 x daily - 7 x  weekly - 1 sets - 10 reps - 3=5 sec hold - Supine 90/90 Alternating Heel Touches with Posterior Pelvic Tilt  - 1 x daily - 7 x weekly - 1 sets - 10 reps - Walking Tandem Stance  - 1 x daily - 7 x weekly - 1 sets - 10 reps - Backward Tandem Walking  - 1 x daily - 7 x weekly - 1 sets - 10 reps  ASSESSMENT:  CLINICAL IMPRESSION: Pt has now met all goals for therapy. Pt reports he is independent with all of his home exercises. Pt's balance has improved greatly and high level balance exercises that he had difficulty doing initially he no longer finds challenging. Encouraged pt to continue with his home exercise program and maybe look in to joining a gym to do weight machines. Pt will be discharged from skilled PT services at this time.    OBJECTIVE IMPAIRMENTS decreased activity tolerance, decreased balance, decreased endurance, difficulty walking, decreased strength, impaired sensation, and postural dysfunction.   ACTIVITY LIMITATIONS carrying and lifting  PARTICIPATION LIMITATIONS: community activity and yard work  PERSONAL FACTORS  none  are also affecting patient's functional outcome.   REHAB POTENTIAL: Good  CLINICAL DECISION MAKING: Stable/uncomplicated  EVALUATION COMPLEXITY: Low  GOALS: Goals reviewed with patient? Yes  SHORT TERM GOALS=LONG TERM GOALS Target date: 09/24/2022    Pt will be able to do SLS for at least 15 sec bilaterally to allow him to don his pants with increased ease. Baseline: 2 sec bilat Goal status: MET 09/15/22 R 17 sec, L 21 sec  2.  Pt will be independent in a home exercise program for continued stretching and strengthening.  Baseline:  Goal status: MET 09/24/22  3.  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  Baseline:  Goal status: MET 09/15/22 50% improvement  4.  Pt will improve R hip flexion strength to 5/5 to decrease fall risk. Baseline: 4/5 Goal status: MET 09/15/22 -  5/5   PLAN: PT FREQUENCY: 2x/week  PT DURATION: 4 weeks  PLANNED INTERVENTIONS: Therapeutic exercises, Therapeutic activity, Neuromuscular re-education, Balance training, Gait training, Patient/Family education, Self Care, and Manual therapy  PLAN FOR NEXT SESSION: d/c this visit  [employer], PT 09/24/2022, 1:08 PM  PHYSICAL THERAPY DISCHARGE SUMMARY  Visits from Start of Care: 9  Current functional level related to goals / functional outcomes: All goals met   Remaining deficits: None   Education / Equipment: HEP   Patient agrees to discharge. Patient goals were met. Patient is being discharged due to meeting the stated rehab goals.  [facility] [place], [place] 09/24/22 1:12 PM

## 2022-10-02 ENCOUNTER — Other Ambulatory Visit: Payer: Self-pay

## 2022-10-02 DIAGNOSIS — C9001 Multiple myeloma in remission: Secondary | ICD-10-CM

## 2022-10-03 ENCOUNTER — Other Ambulatory Visit: Payer: Self-pay | Admitting: Hematology

## 2022-10-03 ENCOUNTER — Inpatient Hospital Stay (HOSPITAL_BASED_OUTPATIENT_CLINIC_OR_DEPARTMENT_OTHER): Payer: Medicare Other | Admitting: Hematology

## 2022-10-03 ENCOUNTER — Inpatient Hospital Stay: Payer: Medicare Other | Attending: Hematology

## 2022-10-03 ENCOUNTER — Inpatient Hospital Stay: Payer: Medicare Other

## 2022-10-03 DIAGNOSIS — C9001 Multiple myeloma in remission: Secondary | ICD-10-CM

## 2022-10-03 DIAGNOSIS — C9000 Multiple myeloma not having achieved remission: Secondary | ICD-10-CM

## 2022-10-03 DIAGNOSIS — I82451 Acute embolism and thrombosis of right peroneal vein: Secondary | ICD-10-CM | POA: Insufficient documentation

## 2022-10-03 DIAGNOSIS — I82411 Acute embolism and thrombosis of right femoral vein: Secondary | ICD-10-CM | POA: Diagnosis not present

## 2022-10-03 DIAGNOSIS — Z7901 Long term (current) use of anticoagulants: Secondary | ICD-10-CM | POA: Diagnosis not present

## 2022-10-03 DIAGNOSIS — I82431 Acute embolism and thrombosis of right popliteal vein: Secondary | ICD-10-CM | POA: Insufficient documentation

## 2022-10-03 DIAGNOSIS — I824Y1 Acute embolism and thrombosis of unspecified deep veins of right proximal lower extremity: Secondary | ICD-10-CM | POA: Insufficient documentation

## 2022-10-03 DIAGNOSIS — Z7189 Other specified counseling: Secondary | ICD-10-CM

## 2022-10-03 LAB — CBC WITH DIFFERENTIAL (CANCER CENTER ONLY)
Abs Immature Granulocytes: 0 10*3/uL (ref 0.00–0.07)
Basophils Absolute: 0 10*3/uL (ref 0.0–0.1)
Basophils Relative: 1 %
Eosinophils Absolute: 0.1 10*3/uL (ref 0.0–0.5)
Eosinophils Relative: 3 %
HCT: 40 % (ref 39.0–52.0)
Hemoglobin: 13.2 g/dL (ref 13.0–17.0)
Immature Granulocytes: 0 %
Lymphocytes Relative: 38 %
Lymphs Abs: 0.8 10*3/uL (ref 0.7–4.0)
MCH: 28 pg (ref 26.0–34.0)
MCHC: 33 g/dL (ref 30.0–36.0)
MCV: 84.7 fL (ref 80.0–100.0)
Monocytes Absolute: 0.2 10*3/uL (ref 0.1–1.0)
Monocytes Relative: 11 %
Neutro Abs: 1 10*3/uL — ABNORMAL LOW (ref 1.7–7.7)
Neutrophils Relative %: 47 %
Platelet Count: 158 10*3/uL (ref 150–400)
RBC: 4.72 MIL/uL (ref 4.22–5.81)
RDW: 15.2 % (ref 11.5–15.5)
WBC Count: 2.1 10*3/uL — ABNORMAL LOW (ref 4.0–10.5)
nRBC: 0 % (ref 0.0–0.2)

## 2022-10-03 LAB — CMP (CANCER CENTER ONLY)
ALT: 21 U/L (ref 0–44)
AST: 23 U/L (ref 15–41)
Albumin: 4 g/dL (ref 3.5–5.0)
Alkaline Phosphatase: 30 U/L — ABNORMAL LOW (ref 38–126)
Anion gap: 3 — ABNORMAL LOW (ref 5–15)
BUN: 13 mg/dL (ref 8–23)
CO2: 30 mmol/L (ref 22–32)
Calcium: 10.3 mg/dL (ref 8.9–10.3)
Chloride: 107 mmol/L (ref 98–111)
Creatinine: 1.14 mg/dL (ref 0.61–1.24)
GFR, Estimated: >60 mL/min (ref >60)
Glucose, Bld: 91 mg/dL (ref 70–99)
Potassium: 4 mmol/L (ref 3.5–5.1)
Sodium: 140 mmol/L (ref 135–145)
Total Bilirubin: 1 mg/dL (ref 0.3–1.2)
Total Protein: 6.8 g/dL (ref 6.5–8.1)

## 2022-10-03 NOTE — Progress Notes (Signed)
Holding zometa today per Dr Irene Limbo, pt states he had a root canal about 2 weeks ago.

## 2022-10-06 LAB — KAPPA/LAMBDA LIGHT CHAINS
Kappa free light chain: 31.2 mg/L — ABNORMAL HIGH (ref 3.3–19.4)
Kappa, lambda light chain ratio: 2.48 — ABNORMAL HIGH (ref 0.26–1.65)
Lambda free light chains: 12.6 mg/L (ref 5.7–26.3)

## 2022-10-07 ENCOUNTER — Other Ambulatory Visit: Payer: Self-pay

## 2022-10-07 LAB — MULTIPLE MYELOMA PANEL, SERUM
Albumin SerPl Elph-Mcnc: 3.8 g/dL (ref 2.9–4.4)
Albumin/Glob SerPl: 1.6 (ref 0.7–1.7)
Alpha 1: 0.2 g/dL (ref 0.0–0.4)
Alpha2 Glob SerPl Elph-Mcnc: 0.6 g/dL (ref 0.4–1.0)
B-Globulin SerPl Elph-Mcnc: 0.7 g/dL (ref 0.7–1.3)
Gamma Glob SerPl Elph-Mcnc: 0.9 g/dL (ref 0.4–1.8)
Globulin, Total: 2.4 g/dL (ref 2.2–3.9)
IgA: 88 mg/dL (ref 61–437)
IgG (Immunoglobin G), Serum: 981 mg/dL (ref 603–1613)
IgM (Immunoglobulin M), Srm: 15 mg/dL (ref 15–143)
Total Protein ELP: 6.2 g/dL (ref 6.0–8.5)

## 2022-10-08 ENCOUNTER — Other Ambulatory Visit: Payer: Self-pay | Admitting: Hematology

## 2022-10-08 DIAGNOSIS — C9000 Multiple myeloma not having achieved remission: Secondary | ICD-10-CM

## 2022-10-10 ENCOUNTER — Encounter: Payer: Self-pay | Admitting: Hematology

## 2022-10-10 NOTE — Progress Notes (Addendum)
[name]   HEMATOLOGY/ONCOLOGY CLINIC VISIT NOTE   Date of Service:10/03/2022   Patient Care Team: [name], MD as PCP - General (Family Medicine) [name], MD as Consulting Physician (Hematology)  CHIEF COMPLAINTS/PURPOSE OF CONSULTATION:  Follow-up for continued valuation and management of multiple myeloma  HISTORY OF PRESENTING ILLNESS:  Please see previous note for details of initial presentation  Nathan Martinez is here for continued evaluation and management of multiple myeloma. He notes no new focal symptoms since his last clinic visit.  Minimal residual tingling and numbness in his hands or feet controlled with current medications.  No new lumps or bumps.  No fevers no chills no night sweats no unexpected weight loss. Labs done today were discussed in detail with the patient.   MEDICAL HISTORY:  Intermittent hypercalcemia - primary hyperparathyroidism status post parathyroidectomy in 2019 HTN HLD Glaucoma History of Helicobacter pylori infection previously treated Chronic leukopenia/neutropenia History of present iron deficiency anemia Acute DVT  SURGICAL HISTORY: Rt TKA 2018 Parathyroidectomy 2019 B/l cataracts sx 2021  SOCIAL HISTORY: Social History   Socioeconomic History   Marital status: Married    Spouse name: Not on file   Number of children: Not on file   Years of education: Not on file   Highest education level: Not on file  Occupational History   Not on file  Tobacco Use   Smoking status: Never   Smokeless tobacco: Never  Vaping Use   Vaping Use: Never used  Substance and Sexual Activity   Alcohol use: Not Currently   Drug use: Never   Sexual activity: Not Currently    Birth control/protection: None  Other Topics Concern   Not on file  Social History Narrative   Not on file   Social Determinants of Health   Financial Resource Strain: Not on file  Food Insecurity: Not on file  Transportation Needs:  Not on file  Physical Activity: Not on file  Stress: Not on file  Social Connections: Not on file  Intimate Partner Violence: Not on file  Ex smoker (quit 1976) 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  MEDICATIONS:  Current Outpatient Medications on File Prior to Visit  Medication Sig Dispense Refill   apixaban (ELIQUIS) 5 MG TABS tablet Take 1 tablet (5 mg total) by mouth 2 (two) times daily. 60 tablet 5   atorvastatin (LIPITOR) 20 MG tablet Take 1 tablet (20 mg total) by mouth at bedtime. 90 tablet 1   ergocalciferol (VITAMIN D2) 1.25 MG (50000 UT) capsule Take 1 capsule (50,000 Units total) by mouth once a week. 30 capsule 0   erythromycin ophthalmic ointment Place 1 Application into both eyes in the morning and at bedtime. 3.5 g 1   gabapentin (NEURONTIN) 100 MG capsule Take 1 capsule (100 mg total) by mouth 2 (two) times daily. Start 1 at bedtime for first week, then increase if tolerated. 60 capsule 1   iron polysaccharides (NIFEREX) 150 MG capsule Take 1 capsule (150 mg total) by mouth daily. 90 capsule 3   Krill Oil (OMEGA-3) 500 MG CAPS      latanoprost (XALATAN) 0.005 % ophthalmic solution SMARTSIG:1 Drop(s) In Eye(s) Every Evening     loratadine (CLARITIN) 10 MG tablet Take 10 mg by mouth daily.     Magnesium Oxide 400 MG  CAPS      Multiple Vitamins-Minerals (MULTIVITAMIN MEN 50+) TABS Take by mouth.     olmesartan (BENICAR) 20 MG tablet Take 1 tablet (20 mg total) by mouth daily. 90 tablet 1   Olopatadine HCl 0.2 % SOLN Apply 1 drop to eye daily. 2.5 mL 1   ondansetron (ZOFRAN) 8 MG tablet Take 1 tablet (8 mg total) by mouth 2 (two) times daily as needed (Nausea or vomiting). (Patient not taking: Reported on 08/25/2022) 30 tablet 1   prochlorperazine (COMPAZINE) 10 MG tablet Take 1 tablet (10 mg total) by mouth every 6  (six) hours as needed (Nausea or vomiting). (Patient not taking: Reported on 08/25/2022) 30 tablet 1   No current facility-administered medications on file prior to visit.     REVIEW OF SYSTEMS:   10 Point review of Systems was done is negative except as noted above.  PHYSICAL EXAMINATION: NAD GENERAL:alert, in no acute distress and comfortable SKIN: no acute rashes, no significant lesions EYES: conjunctiva are pink and non-injected, sclera anicteric OROPHARYNX: MMM, no exudates, no oropharyngeal erythema or ulceration NECK: supple, no JVD LYMPH:  no palpable lymphadenopathy in the cervical, axillary or inguinal regions LUNGS: clear to auscultation b/l with normal respiratory effort HEART: regular rate & rhythm ABDOMEN:  normoactive bowel sounds , non tender, not distended. Extremity: no pedal edema PSYCH: alert & oriented x 3 with fluent speech NEURO: no focal motor/sensory deficits   LABORATORY DATA:  I have reviewed the data as listed  .    Latest Ref Rng & Units 10/03/2022    2:00 PM 08/15/2022   10:54 AM 08/11/2022    8:07 AM  CBC  WBC 4.0 - 10.5 K/uL 2.1  4.1  5.1   Hemoglobin 13.0 - 17.0 g/dL 13.2  13.2  13.0   Hematocrit 39.0 - 52.0 % 40.0  39.7  40.0   Platelets 150 - 400 K/uL 158  128  143    CBC    Component Value Date/Time   WBC 2.1 (L) 10/03/2022 1400   WBC 5.1 08/11/2022 0807   RBC 4.72 10/03/2022 1400   HGB 13.2 10/03/2022 1400   HCT 40.0 10/03/2022 1400   PLT 158 10/03/2022 1400   MCV 84.7 10/03/2022 1400   MCH 28.0 10/03/2022 1400   MCHC 33.0 10/03/2022 1400   RDW 15.2 10/03/2022 1400   LYMPHSABS 0.8 10/03/2022 1400   MONOABS 0.2 10/03/2022 1400   EOSABS 0.1 10/03/2022 1400   BASOSABS 0.0 10/03/2022 1400    .    Latest Ref Rng & Units 10/03/2022    2:00 PM 08/15/2022   10:54 AM 07/25/2022    2:24 PM  CMP  Glucose 70 - 99 mg/dL 91  95  99   BUN 8 - 23 mg/dL _0 Creatinine 0.61 - 1.24 mg/dL 1.14  1.16  1.07   Sodium 135 - 145  mmol/L 140  139  141   Potassium 3.5 - 5.1 mmol/L 4.0  3.9  3.9   Chloride 98 - 111 mmol/L 107  105  109   CO2 22 - 32 mmol/L 30  33  29   Calcium 8.9 - 10.3 mg/dL 10.3  9.7  9.7   Total Protein 6.5 - 8.1 g/dL 6.8  6.2  5.7   Total Bilirubin 0.3 - 1.2 mg/dL 1.0  1.0  0.8   Alkaline Phos 38 - 126 U/L 30  38  35   AST 15 - 41  U/L 23  36  21   ALT 0 - 44 U/L 21  49  27      IEOP/Immunofixation Order: [id number] Component 9 d ago  PATHOLOGIST CHARGE ID  [id number] - Nathan Martinez, M.D.   SERUM IEOP COMMENT   NO DEFINITE CLONALITY DETECTED.   Reviewed and Interpreted By   Nathan Martinez, M.D.       RADIOGRAPHIC STUDIES:   [place] lower extremity venous ([id number]) done 08/19/2022 revealed "Summary:  RIGHT:  - Findings consistent with acute deep vein thrombosis involving the right  proximal, mid, and distal femoral vein, right popliteal vein, and right  peroneal veins.  - No cystic structure found in the popliteal fossa.     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 Most recent PET restaging scan done 08/13/2022 showed no overt signs of disease progression. Myeloma FISH panel no reported mutations. Cytogenetics-loss of Y chromosome.  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 Recent labs have shown resolution of his leukopenia/neutropenia.  We will continue to monitor on Revlimid.  3) Acute DVT of the right lower extremity Ultrasound right lower extremity  08/19/2022- Findings consistent with acute deep vein thrombosis involving the right  proximal, mid, and distal femoral vein, right popliteal vein, and right  peroneal veins.  - No cystic structure found in the popliteal fossa.  PLAN Patient's labs from today were discussed in detail with him CBC within normal limits other than a WBC count of 2.1k with an ANC of 1000 CMP within normal limits Myeloma panel shows no monoclonal M spike.  IFE unremarkable. Continue Eliquis 5 mg p.o. twice daily.  Patient's lower extremity swelling has resolved. -Continue Revlimid maintenance at 10 mg p.o. 3 weeks on 1 week off. -We discussed using compression socks and staying active. -Avoiding dehydration and drinking at least 2 L of water daily. Continue Zometa every 8 weeks   4) history of stye -continue f/u with ophthamology Currently not an acute issue and has resolved since the patient has been off Velcade.   Follow-up   RTC with Dr Irene Limbo with labs and next dose of Zometa in 2 month

## 2022-10-21 ENCOUNTER — Telehealth: Payer: Self-pay | Admitting: Family Medicine

## 2022-10-21 NOTE — Telephone Encounter (Signed)
Left message for patient to call back and schedule Medicare Annual Wellness Visit (AWV) in office.   If not able to come in office, please offer to do virtually or by telephone.  Left office number and my jabber [phone].  AWVI eligible as of 07/25/2022  Please schedule at anytime with Nurse Health Advisor.

## 2022-10-23 ENCOUNTER — Other Ambulatory Visit: Payer: Self-pay

## 2022-10-28 ENCOUNTER — Other Ambulatory Visit: Payer: Self-pay

## 2022-11-04 ENCOUNTER — Other Ambulatory Visit: Payer: Self-pay | Admitting: Hematology

## 2022-11-04 DIAGNOSIS — C9000 Multiple myeloma not having achieved remission: Secondary | ICD-10-CM

## 2022-11-06 ENCOUNTER — Other Ambulatory Visit: Payer: Self-pay

## 2022-11-06 ENCOUNTER — Encounter: Payer: Self-pay | Admitting: Hematology

## 2022-11-10 ENCOUNTER — Encounter: Payer: Self-pay | Admitting: Family Medicine

## 2022-11-10 ENCOUNTER — Ambulatory Visit (INDEPENDENT_AMBULATORY_CARE_PROVIDER_SITE_OTHER): Payer: Medicare Other | Admitting: Family Medicine

## 2022-11-10 VITALS — BP 136/70 | HR 61 | Temp 98.2°F | Ht 73.0 in | Wt 209.0 lb

## 2022-11-10 DIAGNOSIS — G620 Drug-induced polyneuropathy: Secondary | ICD-10-CM | POA: Diagnosis not present

## 2022-11-10 DIAGNOSIS — E559 Vitamin D deficiency, unspecified: Secondary | ICD-10-CM

## 2022-11-10 DIAGNOSIS — R251 Tremor, unspecified: Secondary | ICD-10-CM | POA: Diagnosis not present

## 2022-11-10 DIAGNOSIS — E785 Hyperlipidemia, unspecified: Secondary | ICD-10-CM

## 2022-11-10 DIAGNOSIS — I10 Essential (primary) hypertension: Secondary | ICD-10-CM | POA: Diagnosis not present

## 2022-11-10 LAB — LIPID PANEL
Cholesterol: 147 mg/dL (ref 0–200)
HDL: 55.6 mg/dL (ref >39.00)
LDL Cholesterol: 79 mg/dL (ref 0–99)
NonHDL: 91.2
Total CHOL/HDL Ratio: 3
Triglycerides: 59 mg/dL (ref 0.0–149.0)
VLDL: 11.8 mg/dL (ref 0.0–40.0)

## 2022-11-10 LAB — VITAMIN D 25 HYDROXY (VIT D DEFICIENCY, FRACTURES): VITD: 33.4 ng/mL (ref 30.00–100.00)

## 2022-11-10 MED ORDER — PREGABALIN 25 MG PO CAPS: 25.0000 mg | ORAL_CAPSULE | Freq: Two times a day (BID) | ORAL | 0 refills | Status: DC | PRN

## 2022-11-10 MED ORDER — ATORVASTATIN CALCIUM 20 MG PO TABS: 20.0000 mg | ORAL_TABLET | Freq: Every day | ORAL | 1 refills | Status: DC

## 2022-11-10 MED ORDER — ERGOCALCIFEROL 1.25 MG (50000 UT) PO CAPS: 1.0000 | ORAL_CAPSULE | ORAL | 0 refills | Status: DC

## 2022-11-10 MED ORDER — OLMESARTAN MEDOXOMIL 20 MG PO TABS: 20.0000 mg | ORAL_TABLET | Freq: Every day | ORAL | 1 refills | Status: DC

## 2022-11-10 NOTE — Patient Instructions (Addendum)
If foot nerve pain not continuing to improve, can try lyrica to see if that is tolerated better.  I will refer you to neurology to discuss hand tremor.  No med changes at this time.  If home blood pressures are elevated, please schedule visit with me with your meter.  Take care and let me know if there are questions.    How to Take Your Blood Pressure Blood pressure is a measurement of how strongly your blood is pressing against the walls of your arteries. Arteries are blood vessels that carry blood from your heart throughout your body. Your health care provider takes your blood pressure at each office visit. You can also take your own blood pressure at home with a blood pressure monitor. You may need to take your own blood pressure to: Confirm a diagnosis of high blood pressure (hypertension). Monitor your blood pressure over time. Make sure your blood pressure medicine is working. Supplies needed: Blood pressure monitor. A chair to sit in. This should be a chair where you can sit upright with your back supported. Do not sit on a soft couch or an armchair. Table or desk. Small notebook and pencil or pen. How to prepare To get the most accurate reading, avoid the following for 30 minutes before you check your blood pressure: Drinking caffeine. Drinking alcohol. Eating. Smoking. Exercising. Five minutes before you check your blood pressure: Use the bathroom and urinate so that you have an empty bladder. Sit quietly in a chair. Do not talk. How to take your blood pressure To check your blood pressure, follow the instructions in the manual that came with your blood pressure monitor. If you have a digital blood pressure monitor, the instructions may be as follows: Sit up straight in a chair. Place your feet on the floor. Do not cross your ankles or legs. Rest your left arm at the level of your heart on a table or desk or on the arm of a chair. Pull up your shirt sleeve. Wrap the blood  pressure cuff around the upper part of your left arm, 1 inch (2.5 cm) above your elbow. It is best to wrap the cuff around bare skin. Fit the cuff snugly, but not too tightly, around your arm. You should be able to place only one finger between the cuff and your arm. Position the cord so that it rests in the bend of your elbow. Press the power button. Sit quietly while the cuff inflates and deflates. Read the digital reading on the monitor screen and write the numbers down (record them) in a notebook. Wait 2-3 minutes, then repeat the steps, starting at step 1. What does my blood pressure reading mean? A blood pressure reading consists of a higher number over a lower number. Ideally, your blood pressure should be below 120/80. The first ("top") number is called the systolic pressure. It is a measure of the pressure in your arteries as your heart beats. The second ("bottom") number is called the diastolic pressure. It is a measure of the pressure in your arteries as the heart relaxes. Blood pressure is classified into four stages. The following are the stages for adults who do not have a short-term serious illness or a chronic condition. Systolic pressure and diastolic pressure are measured in a unit called mm Hg (millimeters of mercury).  Normal Systolic pressure: below 563. Diastolic pressure: below 80. Elevated Systolic pressure: 893-734. Diastolic pressure: below 80. Hypertension stage 1 Systolic pressure: 287-681. Diastolic pressure: 15-72. Hypertension stage  2 Systolic pressure: 498 or above. Diastolic pressure: 90 or above. You can have elevated blood pressure or hypertension even if only the systolic or only the diastolic number in your reading is higher than normal. Follow these instructions at home: Medicines Take over-the-counter and prescription medicines only as told by your health care provider. Tell your health care provider if you are having any side effects from blood  pressure medicine. General instructions Check your blood pressure as often as recommended by your health care provider. Check your blood pressure at the same time every day. Take your monitor to the next appointment with your health care provider to make sure that: You are using it correctly. It provides accurate readings. Understand what your goal blood pressure numbers are. Keep all follow-up visits. This is important. General tips Your health care provider can suggest a reliable monitor that will meet your needs. There are several types of home blood pressure monitors. Choose a monitor that has an arm cuff. Do not choose a monitor that measures your blood pressure from your wrist or finger. Choose a cuff that wraps snugly, not too tight or too loose, around your upper arm. You should be able to fit only one finger between your arm and the cuff. You can buy a blood pressure monitor at most drugstores or online. Where to find more information American Heart Association: www.heart.org Contact a health care provider if: Your blood pressure is consistently high. Your blood pressure is suddenly low. Get help right away if: Your systolic blood pressure is higher than 180. Your diastolic blood pressure is higher than 120. These symptoms may be an emergency. Get help right away. Call 911. Do not wait to see if the symptoms will go away. Do not drive yourself to the hospital. Summary Blood pressure is a measurement of how strongly your blood is pressing against the walls of your arteries. A blood pressure reading consists of a higher number over a lower number. Ideally, your blood pressure should be below 120/80. Check your blood pressure at the same time every day. Avoid caffeine, alcohol, smoking, and exercise for 30 minutes prior to checking your blood pressure. These agents can affect the accuracy of the blood pressure reading. This information is not intended to replace advice given to  you by your health care provider. Make sure you discuss any questions you have with your health care provider. Document Revised: 07/25/2021 Document Reviewed: 07/25/2021 Elsevier Patient Education  [place].

## 2022-11-10 NOTE — Progress Notes (Signed)
Subjective:  Patient ID: Nathan Martinez, male    DOB: Sep 21, 1946  Age: 76 y.o. MRN: [id number]  CC:  Chief Complaint  Patient presents with   Hyperlipidemia   Hypertension    Pt states all is well   feet pain    Pt states his feet are a lot better but he couldn't take the meds you gave him, but states they are still hurting     HPI Nathan Martinez presents for   Tremors: Hands, noted primarily in the morning, right greater than left. Mother had tremor in older age. Some during the day as well. Notes with eating or getting excited at times.  Noted past year primarily. Better some days, worse other days.  Had some tests when in [place]. Minimal sx's then, worse now.   Hypertension: Benicar 20 mg daily. No CP/dyspnea. No se's.  Home readings:up to 140/72 BP Readings from Last 3 Encounters:  11/10/22 136/70  08/25/22 122/70  08/15/22 (!) 154/80   Lab Results  Component Value Date   CREATININE 1.14 10/03/2022   Hyperlipidemia: Lipitor 20 mg daily, no new se's . 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  Foot pain noticed after the Velcade injections.  Discussed at his October 2 visit.  Symptoms for approximately 1 month at that time with cold/burning/tingling feeling.  Prescription for gabapentin given, 100 mg nightly - caused stomach upset. Tried for a bout a week or two. Stomach upset entire time. Overall better - minimal burning pain in feet now. Otc cream helps.    Last vitamin D No results found for: "25OHVITD2", "25OHVITD3", "VD25OH" Hx of flow vitamin D at prior PCP in [place]   50k units once per  day.   History Patient Active Problem List   Diagnosis Date Noted   Multiple myeloma not having achieved remission ([place]) 04/16/2022   Multiple myeloma ([place]) 01/24/2022   Counseling regarding advance care planning and goals of care 01/24/2022   Past Medical History:  Diagnosis Date   Hypertension    Past Surgical History:  Procedure Laterality Date   KNEE SURGERY Right 2018   PARATHYROID EXPLORATION     No Known Allergies Prior to Admission medications   Medication Sig Start Date End Date Taking? Authorizing Provider  acyclovir (ZOVIRAX) 400 MG tablet Take 1 tablet by mouth twice daily 10/03/22  Yes [name], MD  apixaban (ELIQUIS) 5 MG TABS tablet Take 1 tablet (5 mg total) by mouth 2 (two) times daily. 08/29/22  Yes [name], MD  atorvastatin (LIPITOR) 20 MG tablet Take 1 tablet (20 mg total) by mouth at bedtime. 05/09/22  Yes [name], MD  ergocalciferol (VITAMIN D2) 1.25 MG (50000 UT) capsule Take 1 capsule (50,000 Units total) by mouth once a week. 06/13/22  Yes [name], MD  erythromycin ophthalmic ointment Place 1 Application into both eyes in the morning and at bedtime. 05/23/22  Yes [name], MD  iron polysaccharides (NIFEREX) 150 MG capsule Take 1 capsule (150 mg total) by mouth daily. 01/23/22  Yes [name], MD  Krill Oil (OMEGA-3) 500 MG CAPS  09/30/16  Yes [provider]  latanoprost (XALATAN) 0.005 % ophthalmic solution SMARTSIG:1 Drop(s) In Eye(s) Every Evening 01/28/22  Yes [provider]  loratadine (CLARITIN) 10 MG tablet Take 10 mg by mouth daily.   Yes [provider]  Magnesium Oxide 400 MG CAPS    Yes [provider]  Multiple Vitamins-Minerals (MULTIVITAMIN MEN 50+) TABS Take by mouth.   Yes [provider]  olmesartan (BENICAR) 20 MG tablet Take 1 tablet (20 mg total) by mouth daily. 05/09/22  Yes [name], MD  Olopatadine HCl 0.2 % SOLN Apply 1 drop  to eye daily. 05/23/22  Yes [name], MD  REVLIMID 10 MG capsule TAKE 1 CAPSULE BY MOUTH ONCE DAILY FOR 21 DAYS ON AND 7 DAYS OFF 11/06/22  Yes [name], MD  gabapentin (NEURONTIN) 100 MG capsule Take 1 capsule (100 mg total) by mouth 2 (two) times daily. Start 1 at bedtime for first week, then increase if tolerated. Patient not taking: Reported on 11/10/2022 08/25/22   [name], MD  ondansetron (ZOFRAN) 8 MG tablet Take 1 tablet (8 mg total) by mouth 2 (two) times daily as needed (Nausea or vomiting). Patient not taking: Reported on 08/25/2022 02/14/22   [name], MD  prochlorperazine (COMPAZINE) 10 MG tablet Take 1 tablet (10 mg total) by mouth every 6 (six) hours as needed (Nausea or vomiting). Patient not taking: Reported on 08/25/2022 02/14/22   [name], MD   Social History   Socioeconomic History   Marital status: Married    Spouse name: Not on file   Number of children: Not on file   Years of education: Not on file   Highest education level: Not on file  Occupational History   Not on file  Tobacco Use   Smoking status: Never   Smokeless tobacco: Never  Vaping Use   Vaping Use: Never used  Substance and Sexual Activity   Alcohol use: Not Currently   Drug use: Never   Sexual activity: Not Currently    Birth control/protection: None  Other Topics Concern   Not on file  Social History Narrative   Not on file   Social Determinants of Health   Financial Resource Strain: Not on file  Food Insecurity: Not on file  Transportation Needs: Not on file  Physical Activity: Not on file  Stress: Not on file  Social Connections: Not on file  Intimate Partner Violence: Not on file    Review of Systems  Constitutional:  Negative for fatigue and unexpected weight change.  Eyes:  Negative for visual disturbance.  Respiratory:  Negative for cough, chest tightness and shortness of breath.   Cardiovascular:  Negative for chest pain,  palpitations and leg swelling.  Gastrointestinal:  Negative for abdominal pain and blood in stool.  Neurological:  Negative for dizziness, light-headedness and headaches.     Objective:   Vitals:   11/10/22 0826  BP: 136/70  Pulse: 61  Temp: 98.2 F (36.8 C)  SpO2: 100%  Weight: 209 lb (94.8 kg)  Height: _0  (1.854 m)     Physical Exam Vitals reviewed.  Constitutional:      Appearance: He is well-developed.  HENT:     Head: Normocephalic and atraumatic.  Neck:     Vascular: No carotid bruit or JVD.  Cardiovascular:     Rate and Rhythm: Normal rate and regular  rhythm.     Heart sounds: Normal heart sounds. No murmur heard. Pulmonary:     Effort: Pulmonary effort is normal.     Breath sounds: Normal breath sounds. No rales.  Musculoskeletal:     Right lower leg: No edema.     Left lower leg: No edema.  Skin:    General: Skin is warm and dry.     Comments: Foot exam, DP pulse 2+, toes warm, cap refill less than 1 second.  No skin changes.  Neurological:     General: No focal deficit present.     Mental Status: He is alert and oriented to person, place, and time.     Cranial Nerves: No dysarthria or facial asymmetry.     Motor: Tremor present. No weakness or pronator drift.     Coordination: Coordination is intact. Romberg sign negative. Finger-Nose-Finger Test normal.     Gait: Gait is intact.     Comments: Left greater than right hand tremor at rest, minimal with finger-to-nose/intention.  Psychiatric:        Mood and Affect: Mood normal.     Assessment & Plan:  TEDD COTTRILL is a 76 y.o. male . Tremor of both hands - Plan: Ambulatory referral to Neurology  -New concern, question essential tremor versus essential tremor, with family history of same by report above.  Refer to neurology given increased symptoms.  Hyperlipidemia, unspecified hyperlipidemia type - Plan: atorvastatin (LIPITOR) 20 MG tablet, Lipid panel  -  Stable, tolerating current regimen.  Medications refilled. Labs pending as above.   Drug-induced polyneuropathy ([place]) - Plan: pregabalin (LYRICA) 25 MG capsule  -Improved with topical treatment, option of Lyrica.  RTC precautions.  Essential hypertension - Plan: olmesartan (BENICAR) 20 MG tablet  -Borderline, some higher readings at home, okay in office including previous readings.  Check home readings with RTC precautions with home meter to verify accuracy.  Vitamin D deficiency - Plan: ergocalciferol (VITAMIN D2) 1.25 MG (50000 UT) capsule, Vitamin D (25 hydroxy)  -Check level, continue supplementation.  If high, likely will switch to over-the-counter treatment only.  Meds ordered this encounter  Medications   atorvastatin (LIPITOR) 20 MG tablet    Sig: Take 1 tablet (20 mg total) by mouth at bedtime.    Dispense:  90 tablet    Refill:  1   ergocalciferol (VITAMIN D2) 1.25 MG (50000 UT) capsule    Sig: Take 1 capsule (50,000 Units total) by mouth once a week.    Dispense:  30 capsule    Refill:  0   olmesartan (BENICAR) 20 MG tablet    Sig: Take 1 tablet (20 mg total) by mouth daily.    Dispense:  90 tablet    Refill:  1   pregabalin (LYRICA) 25 MG capsule    Sig: Take 1 capsule (25 mg total) by mouth 2 (two) times daily as needed.    Dispense:  30 capsule    Refill:  0   Patient Instructions  If foot nerve pain not continuing to improve, can try lyrica to see if that is tolerated better.  I will refer you to neurology to discuss hand tremor.  No med changes at this time.  If home blood pressures are elevated, please schedule visit with me with your meter.  Take care and let me know if there are questions.    How to Take Your Blood Pressure Blood pressure is a measurement of how strongly your blood is pressing against the  walls of your arteries. Arteries are blood vessels that carry blood from your heart throughout your body. Your health care provider takes your blood pressure at each office visit. You can also  take your own blood pressure at home with a blood pressure monitor. You may need to take your own blood pressure to: Confirm a diagnosis of high blood pressure (hypertension). Monitor your blood pressure over time. Make sure your blood pressure medicine is working. Supplies needed: Blood pressure monitor. A chair to sit in. This should be a chair where you can sit upright with your back supported. Do not sit on a soft couch or an armchair. Table or desk. Small notebook and pencil or pen. How to prepare To get the most accurate reading, avoid the following for 30 minutes before you check your blood pressure: Drinking caffeine. Drinking alcohol. Eating. Smoking. Exercising. Five minutes before you check your blood pressure: Use the bathroom and urinate so that you have an empty bladder. Sit quietly in a chair. Do not talk. How to take your blood pressure To check your blood pressure, follow the instructions in the manual that came with your blood pressure monitor. If you have a digital blood pressure monitor, the instructions may be as follows: Sit up straight in a chair. Place your feet on the floor. Do not cross your ankles or legs. Rest your left arm at the level of your heart on a table or desk or on the arm of a chair. Pull up your shirt sleeve. Wrap the blood pressure cuff around the upper part of your left arm, 1 inch (2.5 cm) above your elbow. It is best to wrap the cuff around bare skin. Fit the cuff snugly, but not too tightly, around your arm. You should be able to place only one finger between the cuff and your arm. Position the cord so that it rests in the bend of your elbow. Press the power button. Sit quietly while the cuff inflates and deflates. Read the digital reading on the monitor screen and write the numbers down (record them) in a notebook. Wait 2-3 minutes, then repeat the steps, starting at step 1. What does my blood pressure reading mean? A blood pressure  reading consists of a higher number over a lower number. Ideally, your blood pressure should be below 120/80. The first ("top") number is called the systolic pressure. It is a measure of the pressure in your arteries as your heart beats. The second ("bottom") number is called the diastolic pressure. It is a measure of the pressure in your arteries as the heart relaxes. Blood pressure is classified into four stages. The following are the stages for adults who do not have a short-term serious illness or a chronic condition. Systolic pressure and diastolic pressure are measured in a unit called mm Hg (millimeters of mercury).  Normal Systolic pressure: below 177. Diastolic pressure: below 80. Elevated Systolic pressure: 939-030. Diastolic pressure: below 80. Hypertension stage 1 Systolic pressure: 092-330. Diastolic pressure: 07-62. Hypertension stage 2 Systolic pressure: 263 or above. Diastolic pressure: 90 or above. You can have elevated blood pressure or hypertension even if only the systolic or only the diastolic number in your reading is higher than normal. Follow these instructions at home: Medicines Take over-the-counter and prescription medicines only as told by your health care provider. Tell your health care provider if you are having any side effects from blood pressure medicine. General instructions Check your blood pressure as often as recommended by your health  care provider. Check your blood pressure at the same time every day. Take your monitor to the next appointment with your health care provider to make sure that: You are using it correctly. It provides accurate readings. Understand what your goal blood pressure numbers are. Keep all follow-up visits. This is important. General tips Your health care provider can suggest a reliable monitor that will meet your needs. There are several types of home blood pressure monitors. Choose a monitor that has an arm cuff. Do not  choose a monitor that measures your blood pressure from your wrist or finger. Choose a cuff that wraps snugly, not too tight or too loose, around your upper arm. You should be able to fit only one finger between your arm and the cuff. You can buy a blood pressure monitor at most drugstores or online. Where to find more information American Heart Association: www.heart.org Contact a health care provider if: Your blood pressure is consistently high. Your blood pressure is suddenly low. Get help right away if: Your systolic blood pressure is higher than 180. Your diastolic blood pressure is higher than 120. These symptoms may be an emergency. Get help right away. Call 911. Do not wait to see if the symptoms will go away. Do not drive yourself to the hospital. Summary Blood pressure is a measurement of how strongly your blood is pressing against the walls of your arteries. A blood pressure reading consists of a higher number over a lower number. Ideally, your blood pressure should be below 120/80. Check your blood pressure at the same time every day. Avoid caffeine, alcohol, smoking, and exercise for 30 minutes prior to checking your blood pressure. These agents can affect the accuracy of the blood pressure reading. This information is not intended to replace advice given to you by your health care provider. Make sure you discuss any questions you have with your health care provider. Document Revised: 07/25/2021 Document Reviewed: 07/25/2021 Elsevier Patient Education  2023 [place],   Merri Ray, MD [place] 11/10/22 9:09 AM

## 2022-11-11 ENCOUNTER — Encounter: Payer: Self-pay | Admitting: Neurology

## 2022-11-11 ENCOUNTER — Other Ambulatory Visit: Payer: Self-pay

## 2022-11-11 ENCOUNTER — Other Ambulatory Visit: Payer: Self-pay | Admitting: Pharmacist

## 2022-11-13 ENCOUNTER — Other Ambulatory Visit: Payer: Self-pay | Admitting: Hematology

## 2022-11-13 DIAGNOSIS — C9000 Multiple myeloma not having achieved remission: Secondary | ICD-10-CM

## 2022-11-13 DIAGNOSIS — Z7189 Other specified counseling: Secondary | ICD-10-CM

## 2022-11-24 ENCOUNTER — Other Ambulatory Visit: Payer: Self-pay | Admitting: Family Medicine

## 2022-11-24 DIAGNOSIS — G620 Drug-induced polyneuropathy: Secondary | ICD-10-CM

## 2022-11-25 ENCOUNTER — Other Ambulatory Visit: Payer: Self-pay | Admitting: Lab

## 2022-11-25 DIAGNOSIS — G620 Drug-induced polyneuropathy: Secondary | ICD-10-CM

## 2022-11-25 MED ORDER — GABAPENTIN 100 MG PO CAPS: 100.0000 mg | ORAL_CAPSULE | Freq: Two times a day (BID) | ORAL | 1 refills | Status: DC

## 2022-12-01 ENCOUNTER — Other Ambulatory Visit: Payer: Self-pay | Admitting: Hematology

## 2022-12-01 DIAGNOSIS — C9000 Multiple myeloma not having achieved remission: Secondary | ICD-10-CM

## 2022-12-02 ENCOUNTER — Encounter: Payer: Self-pay | Admitting: Hematology

## 2022-12-02 ENCOUNTER — Other Ambulatory Visit: Payer: Self-pay

## 2022-12-05 ENCOUNTER — Other Ambulatory Visit: Payer: Self-pay | Admitting: *Deleted

## 2022-12-05 DIAGNOSIS — C9001 Multiple myeloma in remission: Secondary | ICD-10-CM

## 2022-12-08 ENCOUNTER — Inpatient Hospital Stay (HOSPITAL_BASED_OUTPATIENT_CLINIC_OR_DEPARTMENT_OTHER): Payer: Medicare Other | Admitting: Hematology

## 2022-12-08 ENCOUNTER — Inpatient Hospital Stay: Payer: Medicare Other

## 2022-12-08 ENCOUNTER — Inpatient Hospital Stay: Payer: Medicare Other | Attending: Hematology

## 2022-12-08 ENCOUNTER — Other Ambulatory Visit: Payer: Self-pay

## 2022-12-08 VITALS — BP 148/79 | HR 58 | Temp 97.7°F | Resp 18 | Ht 73.0 in | Wt 210.8 lb

## 2022-12-08 DIAGNOSIS — C9001 Multiple myeloma in remission: Secondary | ICD-10-CM | POA: Diagnosis not present

## 2022-12-08 DIAGNOSIS — C9000 Multiple myeloma not having achieved remission: Secondary | ICD-10-CM

## 2022-12-08 LAB — CBC WITH DIFFERENTIAL (CANCER CENTER ONLY)
Abs Immature Granulocytes: 0 10*3/uL (ref 0.00–0.07)
Basophils Absolute: 0 10*3/uL (ref 0.0–0.1)
Basophils Relative: 1 %
Eosinophils Absolute: 0.1 10*3/uL (ref 0.0–0.5)
Eosinophils Relative: 4 %
HCT: 40.8 % (ref 39.0–52.0)
Hemoglobin: 13.9 g/dL (ref 13.0–17.0)
Immature Granulocytes: 0 %
Lymphocytes Relative: 27 %
Lymphs Abs: 0.7 10*3/uL (ref 0.7–4.0)
MCH: 28.5 pg (ref 26.0–34.0)
MCHC: 34.1 g/dL (ref 30.0–36.0)
MCV: 83.8 fL (ref 80.0–100.0)
Monocytes Absolute: 0.5 10*3/uL (ref 0.1–1.0)
Monocytes Relative: 20 %
Neutro Abs: 1.2 10*3/uL — ABNORMAL LOW (ref 1.7–7.7)
Neutrophils Relative %: 48 %
Platelet Count: 131 10*3/uL — ABNORMAL LOW (ref 150–400)
RBC: 4.87 MIL/uL (ref 4.22–5.81)
RDW: 14.6 % (ref 11.5–15.5)
WBC Count: 2.4 10*3/uL — ABNORMAL LOW (ref 4.0–10.5)
nRBC: 0 % (ref 0.0–0.2)

## 2022-12-08 LAB — CMP (CANCER CENTER ONLY)
ALT: 26 U/L (ref 0–44)
AST: 25 U/L (ref 15–41)
Albumin: 3.9 g/dL (ref 3.5–5.0)
Alkaline Phosphatase: 27 U/L — ABNORMAL LOW (ref 38–126)
Anion gap: 2 — ABNORMAL LOW (ref 5–15)
BUN: 15 mg/dL (ref 8–23)
CO2: 30 mmol/L (ref 22–32)
Calcium: 10.4 mg/dL — ABNORMAL HIGH (ref 8.9–10.3)
Chloride: 106 mmol/L (ref 98–111)
Creatinine: 1.06 mg/dL (ref 0.61–1.24)
GFR, Estimated: >60 mL/min (ref >60)
Glucose, Bld: 82 mg/dL (ref 70–99)
Potassium: 4.2 mmol/L (ref 3.5–5.1)
Sodium: 138 mmol/L (ref 135–145)
Total Bilirubin: 0.8 mg/dL (ref 0.3–1.2)
Total Protein: 6.4 g/dL — ABNORMAL LOW (ref 6.5–8.1)

## 2022-12-08 MED ORDER — SODIUM CHLORIDE 0.9 % IV SOLN: Freq: Once | INTRAVENOUS | Status: AC

## 2022-12-08 MED ORDER — ZOLEDRONIC ACID 4 MG/100ML IV SOLN
4.0000 mg | Freq: Once | INTRAVENOUS | Status: AC
  Administered 2022-12-08: 4 mg via INTRAVENOUS
  Filled 2022-12-08: qty 100

## 2022-12-08 NOTE — Progress Notes (Signed)
[name]   HEMATOLOGY/ONCOLOGY CLINIC VISIT NOTE   Date of Service: 12/08/22  Patient Care Team: [name], MD as PCP - General (Family Medicine) [name], MD as Consulting Physician (Hematology)  CHIEF COMPLAINTS/PURPOSE OF CONSULTATION:  Follow-up for continued valuation and management of multiple myeloma  HISTORY OF PRESENTING ILLNESS:  Please see previous note for details of initial presentation  [place] is here for continued evaluation and management of multiple myeloma.  Patient was last seen by me on 10/03/2022 and complained of mild tingling sensation and numbness in his hands and feet which were controlled with his medication.  Patient is accompanied by his wife during this visit. He reports he has been doing well without any new medical concerns since our last visit. He denies any toxicities with revlimid. He denies diarrhea, constipation, any other abnormal bowel movement, fever, chills, night sweats, unexpected weight loss, abdominal pain, bone pain, back pain, abnormal bleeding, or leg swelling.   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 He is also taking Eliquis 5 mg twice daily. He has discontinued Lyrica 25 mg and iron polysaccharides 150 mg.   Patient notes that his tremors are getting better. He still gets back tremors and right hand tremor when he tired.   Patient has been regularly taking vitamin-D supplement, but denies B-Complex supplement.   He has received the influenza vaccine, COVID-19 Booster, and RSV vaccine. He is also up to date with all age appropriate vaccines.    MEDICAL HISTORY:  Intermittent hypercalcemia - primary hyperparathyroidism status post parathyroidectomy in 2019 HTN HLD Glaucoma History of Helicobacter pylori infection previously treated Chronic leukopenia/neutropenia History of present iron deficiency anemia Acute  DVT  SURGICAL HISTORY: Rt TKA 2018 Parathyroidectomy 2019 B/l cataracts sx 2021  SOCIAL HISTORY: Social History   Socioeconomic History   Marital status: Married    Spouse name: Not on file   Number of children: Not on file   Years of education: Not on file   Highest education level: Not on file  Occupational History   Not on file  Tobacco Use   Smoking status: Never   Smokeless tobacco: Never  Vaping Use   Vaping Use: Never used  Substance and Sexual Activity   Alcohol use: Not Currently   Drug use: Never   Sexual activity: Not Currently    Birth control/protection: None  Other Topics Concern   Not on file  Social History Narrative   Not on file   Social Determinants of Health   Financial Resource Strain: Not on file  Food Insecurity: Not on file  Transportation Needs: Not on file  Physical Activity: Not on file  Stress: Not on file  Social Connections: Not on file  Intimate Partner Violence: Not on file  Ex smoker (quit 1976) 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  MEDICATIONS:  Current Outpatient Medications on File Prior to Visit  Medication Sig Dispense Refill   acyclovir (ZOVIRAX) 400 MG tablet Take 1 tablet by mouth twice daily 60 tablet 0   apixaban (ELIQUIS) 5 MG TABS tablet Take 1 tablet (5 mg total) by mouth 2 (two) times daily. 60 tablet 5   atorvastatin (LIPITOR) 20 MG tablet Take 1 tablet (20 mg total) by  mouth at bedtime. 90 tablet 1   ergocalciferol (VITAMIN D2) 1.25 MG (50000 UT) capsule Take 1 capsule (50,000 Units total) by mouth once a week. 30 capsule 0   erythromycin ophthalmic ointment Place 1 Application into both eyes in the morning and at bedtime. 3.5 g 1   gabapentin (NEURONTIN) 100 MG capsule Take 1 capsule (100 mg total) by mouth 2 (two) times daily. Start 1 at bedtime for  first week, then increase if tolerated. 60 capsule 1   iron polysaccharides (NIFEREX) 150 MG capsule Take 1 capsule (150 mg total) by mouth daily. 90 capsule 3   Krill Oil (OMEGA-3) 500 MG CAPS      latanoprost (XALATAN) 0.005 % ophthalmic solution SMARTSIG:1 Drop(s) In Eye(s) Every Evening     loratadine (CLARITIN) 10 MG tablet Take 10 mg by mouth daily.     Magnesium Oxide 400 MG CAPS      Multiple Vitamins-Minerals (MULTIVITAMIN MEN 50+) TABS Take by mouth.     olmesartan (BENICAR) 20 MG tablet Take 1 tablet (20 mg total) by mouth daily. 90 tablet 1   Olopatadine HCl 0.2 % SOLN Apply 1 drop to eye daily. 2.5 mL 1   pregabalin (LYRICA) 25 MG capsule TAKE 1 CAPSULE BY MOUTH TWICE DAILY AS NEEDED 30 capsule 0   REVLIMID 10 MG capsule TAKE 1 CAPSULE BY MOUTH 1 TIME A DAY FOR 21 DAYS ON AND 7 DAYS OFF 21 capsule 0   No current facility-administered medications on file prior to visit.     REVIEW OF SYSTEMS:   10 Point review of Systems was done is negative except as noted above.  PHYSICAL EXAMINATION: .BP (!) 148/79 (BP Location: Left Arm, Patient Position: Sitting) Comment: nurse notified  Pulse (!) 58   Temp 97.7 F (36.5 C) (Temporal)   Resp 18   Ht '6\' 1"'$  (1.854 m)   Wt 210 lb 12.8 oz (95.6 kg)   SpO2 97%   BMI 27.81 kg/m   NAD GENERAL:alert, in no acute distress and comfortable SKIN: no acute rashes, no significant lesions EYES: conjunctiva are pink and non-injected, sclera anicteric OROPHARYNX: MMM, no exudates, no oropharyngeal erythema or ulceration NECK: supple, no JVD LYMPH:  no palpable lymphadenopathy in the cervical, axillary or inguinal regions LUNGS: clear to auscultation b/l with normal respiratory effort HEART: regular rate & rhythm ABDOMEN:  normoactive bowel sounds , non tender, not distended. Extremity: no pedal edema PSYCH: alert & oriented x 3 with fluent speech NEURO: no focal motor/sensory deficits   LABORATORY DATA:  I have reviewed the data as  listed  .    Latest Ref Rng & Units 12/08/2022    1:21 PM 10/03/2022    2:00 PM 08/15/2022   10:54 AM  CBC  WBC 4.0 - 10.5 K/uL 2.4  2.1  4.1   Hemoglobin 13.0 - 17.0 g/dL 13.9  13.2  13.2   Hematocrit 39.0 - 52.0 % 40.8  40.0  39.7   Platelets 150 - 400 K/uL 131  158  128    CBC    Component Value Date/Time   WBC 2.4 (L) 12/08/2022 1321   WBC 5.1 08/11/2022 0807   RBC 4.87 12/08/2022 1321   HGB 13.9 12/08/2022 1321   HCT 40.8 12/08/2022 1321   PLT 131 (L) 12/08/2022 1321   MCV 83.8 12/08/2022 1321   MCH 28.5 12/08/2022 1321   MCHC 34.1 12/08/2022 1321   RDW 14.6 12/08/2022 1321   LYMPHSABS 0.7 12/08/2022 1321  MONOABS 0.5 12/08/2022 1321   EOSABS 0.1 12/08/2022 1321   BASOSABS 0.0 12/08/2022 1321    .    Latest Ref Rng & Units 12/08/2022    1:21 PM 10/03/2022    2:00 PM 08/15/2022   10:54 AM  CMP  Glucose 70 - 99 mg/dL 82  91  95   BUN 8 - 23 mg/dL '15  13  14   '$ Creatinine 0.61 - 1.24 mg/dL 1.06  1.14  1.16   Sodium 135 - 145 mmol/L 138  140  139   Potassium 3.5 - 5.1 mmol/L 4.2  4.0  3.9   Chloride 98 - 111 mmol/L 106  107  105   CO2 22 - 32 mmol/L 30  30  33   Calcium 8.9 - 10.3 mg/dL 10.4  10.3  9.7   Total Protein 6.5 - 8.1 g/dL 6.4  6.8  6.2   Total Bilirubin 0.3 - 1.2 mg/dL 0.8  1.0  1.0   Alkaline Phos 38 - 126 U/L 27  30  38   AST 15 - 41 U/L 25  23  36   ALT 0 - 44 U/L 26  21  49      IEOP/Immunofixation Order: [id number] Component 9 d ago  PATHOLOGIST CHARGE ID  [id number] - Jamison Neighbor, M.D.   SERUM IEOP COMMENT   NO DEFINITE CLONALITY DETECTED.   Reviewed and Interpreted By   Jamison Neighbor, M.D.       RADIOGRAPHIC STUDIES:   [place] lower extremity venous ([id number]) done 08/19/2022 revealed "Summary:  RIGHT:  - Findings consistent with acute deep vein thrombosis involving the right  proximal, mid, and distal femoral vein, right popliteal vein, and right  peroneal veins.  - No cystic structure found in the popliteal  fossa.     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 Most recent PET restaging scan done 08/13/2022 showed no overt signs of disease progression. Myeloma FISH panel no reported mutations. Cytogenetics-loss of Y chromosome.  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 Recent labs have shown resolution of his leukopenia/neutropenia.  We will continue to monitor on Revlimid.  3) Acute DVT of the right lower extremity Ultrasound right lower extremity 08/19/2022- Findings consistent with acute deep vein thrombosis involving the right  proximal, mid, and distal femoral vein, right popliteal vein, and right  peroneal veins.  - No cystic structure found in the popliteal fossa.   4) history of stye -continue f/u with ophthamology Currently not an acute issue and has resolved since the patient has been off Velcade.  PLAN: -discussed lab results from today, 12/08/2022, with the patient and his wife. CBC show slightly decreased WBC of 2.4 K and slightly decreased platelets of 131 K. CMP shows decreased Alkaline Phosphate of 27. Lab results shows thrombocytopenia and neutropenia.  -Myeloma lab shows M spike of 0.1g/dl IgG kappa monoclonal protein-- will need to closely monitor this. -Advised patient to start B-Complex supplement and continue Vitamin-D supplement. -Patient denies any toxicities with Revlimid.  -Answered all of patient's questions.  -Continue  Revlimid maintenance at 10 mg p.o. 3  weeks on 1 week off. -We discussed using compression socks and staying active. -Avoiding dehydration and drinking at least 2 L of water daily. Continue Zometa every 8 weeks  -Continue Eliquis 5 mg p.o. twice daily.   FOLLOW-UP: RTC with Dr Irene Limbo with labs and next dose of Zometa in 2 month.  The total time spent in the appointment was 25 minutes* .  All of the patient's questions were answered with apparent satisfaction. The patient knows to call the clinic with any problems, questions or concerns.   Sullivan Lone MD MS AAHIVMS [facility] Hematology/Oncology Physician [facility]  .*Total Encounter Time as defined by the Centers for Medicare and Medicaid Services includes, in addition to the face-to-face time of a patient visit (documented in the note above) non-face-to-face time: obtaining and reviewing outside history, ordering and reviewing medications, tests or procedures, care coordination (communications with other health care professionals or caregivers) and documentation in the medical record.   I, Cleda Mccreedy, am acting as a Education administrator for Sullivan Lone, MD. .I have reviewed the above documentation for accuracy and completeness, and I agree with the above. [name] MD

## 2022-12-08 NOTE — Patient Instructions (Signed)

## 2022-12-08 NOTE — Progress Notes (Signed)
Patient seen by MD today  Vitals are within treatment parameters.  Labs reviewed: and are within treatment parameters."  Per physician team, patient is ready for treatment and there are NO modifications to the treatment plan.

## 2022-12-09 LAB — KAPPA/LAMBDA LIGHT CHAINS
Kappa free light chain: 24.6 mg/L — ABNORMAL HIGH (ref 3.3–19.4)
Kappa, lambda light chain ratio: 1.77 — ABNORMAL HIGH (ref 0.26–1.65)
Lambda free light chains: 13.9 mg/L (ref 5.7–26.3)

## 2022-12-12 LAB — MULTIPLE MYELOMA PANEL, SERUM
Albumin SerPl Elph-Mcnc: 3.9 g/dL (ref 2.9–4.4)
Albumin/Glob SerPl: 1.8 — ABNORMAL HIGH (ref 0.7–1.7)
Alpha 1: 0.2 g/dL (ref 0.0–0.4)
Alpha2 Glob SerPl Elph-Mcnc: 0.5 g/dL (ref 0.4–1.0)
B-Globulin SerPl Elph-Mcnc: 0.6 g/dL — ABNORMAL LOW (ref 0.7–1.3)
Gamma Glob SerPl Elph-Mcnc: 0.9 g/dL (ref 0.4–1.8)
Globulin, Total: 2.2 g/dL (ref 2.2–3.9)
IgA: 69 mg/dL (ref 61–437)
IgG (Immunoglobin G), Serum: 1051 mg/dL (ref 603–1613)
IgM (Immunoglobulin M), Srm: 13 mg/dL — ABNORMAL LOW (ref 15–143)
M Protein SerPl Elph-Mcnc: 0.1 g/dL — ABNORMAL HIGH
Total Protein ELP: 6.1 g/dL (ref 6.0–8.5)

## 2022-12-14 ENCOUNTER — Encounter: Payer: Self-pay | Admitting: Hematology

## 2022-12-18 ENCOUNTER — Other Ambulatory Visit: Payer: Self-pay | Admitting: Hematology

## 2022-12-18 DIAGNOSIS — Z7189 Other specified counseling: Secondary | ICD-10-CM

## 2022-12-18 DIAGNOSIS — C9000 Multiple myeloma not having achieved remission: Secondary | ICD-10-CM

## 2022-12-18 NOTE — Progress Notes (Signed)
Assessment/Plan:    Essential Tremor.  -This is evidenced by the symmetrical nature and longstanding hx of gradually getting worse.  We discussed nature and pathophysiology.  We discussed that this can continue to gradually get worse with time.  We discussed that some medications can worsen this, as can caffeine use.  We discussed medication therapy as well as surgical therapy.  The patient is not interested in surgical interventions right now.  He was shown HIPAA compliant videos of patients who have had DBS surgery.  He would like to consider propranolol.  I will contact his primary care and see if that is an option.  He is not able to take primidone because of interaction with the Eliquis.   2.  Multiple myeloma  -Following with hematology/oncology  3.  Chemo induced PN  -Patient reports that it is actually much better than it used to be.  He is currently on gabapentin.  Subjective:   Nathan Martinez was seen today in the movement disorders clinic for neurologic consultation at the request of [name], MD.  The consultation is for the evaluation of left greater than right upper extremity tremor..  Patient was previously evaluated for this [place] in 2018.  At that point in time, tremor has been going on for about a year.  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  It does not appear that he was seen in neurology after that EMG was completed.  Notes from Dr. Nyoka Cowden do indicate that they tried to give him gabapentin recently for foot pain, 100 mg at bedtime and this caused GI upset and it was discontinued.  It appears that he was previously on low-dose Lyrica, 25 mg.  He states today that he is actually back on the gabapentin and tolerating it.  Tremor: Yes.     How long has it been going on? At least 7 years,  intermittent, worse with recent chemo for MM  At rest or with activation?  activation  When is it noted the most?  Eating (he is R hand dominant)  Fam hx of tremor?  Yes.  ,  Mother with tremor (no known dx)  Located where?  Started on R hand; went to the L hand with chemo about 6 months.    Affected by caffeine:  unknown (doesn't drink enough); did note that he bought green tea in [place] and that caused tremor  Affected by alcohol:  No. (drinks 2-3  beers/month)  Affected by stress:  Yes.    Affected by fatigue:  Yes.    Spills soup if on spoon:  can usually manipulate the spoon to keep it on the spoon  Spills glass of liquid if full:  No.  Affects ADL's (tying shoes, brushing teeth, etc):  No.  Tremor inducing meds:  No.  Other Specific Symptoms:  Voice: no change Sleep: sleeps well  Vivid Dreams:  No.  Acting out dreams:  No. Wet Pillows: No. 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  Last fall was a year ago; tripped in BR at night and didn't get hurt Bradykinesia symptoms: no bradykinesia noted Loss of smell:  No. Loss of taste:  Yes.   (Due to chemo but getting better) Urinary Incontinence:  No. Difficulty Swallowing:  No. Handwriting,  micrographia: Yes.   Memory changes:  No. N/V:  No. Lightheaded:  No.  Syncope: No. Diplopia:  No. Dyskinesia:  No.  Neuroimaging of the brain has not previously been performed.   PREVIOUS MEDICATIONS: none to date  ALLERGIES:  No Known Allergies  CURRENT MEDICATIONS:  Current Outpatient Medications  Medication Instructions   acyclovir (ZOVIRAX) 400 mg, Oral, 2 times daily   apixaban (ELIQUIS) 5 mg, Oral, 2 times daily   atorvastatin (LIPITOR) 20 mg, Oral, Daily at bedtime   ergocalciferol (VITAMIN D2) 50,000 Units, Oral, Weekly   erythromycin ophthalmic ointment 1 Application, Both Eyes, 2 times daily   gabapentin (NEURONTIN) 100 mg, Oral, 2 times daily, Start 1 at bedtime for first  week, then increase if tolerated.   iron polysaccharides (NIFEREX) 150 mg, Oral, Daily   Krill Oil (OMEGA-3) 500 MG CAPS No dose, route, or frequency recorded.   latanoprost (XALATAN) 0.005 % ophthalmic solution SMARTSIG:1 Drop(s) In Eye(s) Every Evening   loratadine (CLARITIN) 10 mg, Oral, Daily   Magnesium Oxide 400 MG CAPS No dose, route, or frequency recorded.   Multiple Vitamins-Minerals (MULTIVITAMIN MEN 50+) TABS Oral   olmesartan (BENICAR) 20 mg, Oral, Daily   Olopatadine HCl 0.2 % SOLN 1 drop, Ophthalmic, Daily   pregabalin (LYRICA) 25 MG capsule TAKE 1 CAPSULE BY MOUTH TWICE DAILY AS NEEDED   REVLIMID 10 MG capsule TAKE 1 CAPSULE BY MOUTH 1 TIME A DAY FOR 21 DAYS ON AND 7 DAYS OFF    Objective:   PHYSICAL EXAMINATION:    VITALS:   Vitals:   12/22/22 1005  BP: (!) 154/92  Pulse: 78  SpO2: 98%  Weight: 210 lb 6.4 oz (95.4 kg)  Height: '6\' 1"'$  (1.854 m)    GEN:  The patient appears stated age and is in NAD. HEENT:  Normocephalic, atraumatic.  The mucous membranes are moist. The superficial temporal arteries are without ropiness or tenderness. CV:  RRR Lungs:  CTAB Neck/HEME:  There are no carotid bruits bilaterally.  Neurological examination:  Orientation: The patient is alert and oriented x3.  Cranial nerves: There is good facial symmetry.  Extraocular muscles are intact. The visual fields are full to confrontational testing. The speech is fluent and clear. Soft palate rises symmetrically and there is no tongue deviation. Hearing is intact to conversational tone. Sensation: Sensation is intact to light touch throughout (facial, trunk, extremities). Vibration is markedly decreased distally (absent in legs). There is no extinction with double simultaneous stimulation.  Motor: Strength is 5/5 in the bilateral upper and lower extremities.   Shoulder shrug is equal and symmetric.  There is no pronator drift. Deep tendon reflexes: Deep tendon reflexes are 0-tr/4 at the  bilateral biceps, triceps, brachioradialis, patella and achilles. Plantar responses are downgoing bilaterally.  Movement examination: Tone: There is normal tone in the bilateral upper extremities.  The tone in the lower extremities is normal .  Abnormal movements: there is no rest tremor.  There is postural tremor bilaterally, better when given a weight.  It is slightly worse on the left and right.  He has mild trouble with Archimedes spirals, more so on the right than left.  Tremor is most noticeable and at least moderate when he pours water from 1 glass to another.  Tremor is most severe on the right when pouring water and he does spill a bit.  Coordination:  There is no decremation with RAM's, with any form of RAMS, including alternating supination and pronation of the forearm, hand  opening and closing, finger taps, heel taps and toe taps.  Gait and Station: The patient has no difficulty arising out of a deep-seated chair without the use of the hands. The patient's stride length is good.  He is able to ambulate in a tandem fashion. I have reviewed and interpreted the following labs independently   Chemistry      Component Value Date/Time   NA 138 12/08/2022 1321   K 4.2 12/08/2022 1321   CL 106 12/08/2022 1321   CO2 30 12/08/2022 1321   BUN 15 12/08/2022 1321   CREATININE 1.06 12/08/2022 1321      Component Value Date/Time   CALCIUM 10.4 (H) 12/08/2022 1321   CALCIUM 10.7 (H) 12/12/2021 1225   ALKPHOS 27 (L) 12/08/2022 1321   AST 25 12/08/2022 1321   ALT 26 12/08/2022 1321   BILITOT 0.8 12/08/2022 1321      Lab Results  Component Value Date   TSH 1.295 12/12/2021   Lab Results  Component Value Date   WBC 2.4 (L) 12/08/2022   HGB 13.9 12/08/2022   HCT 40.8 12/08/2022   MCV 83.8 12/08/2022   PLT 131 (L) 12/08/2022      Total time spent on today's visit was 62 minutes, including both face-to-face time and nonface-to-face time.  Time included that spent on review of records  (prior notes available to me/labs/imaging if pertinent), discussing treatment and goals, answering patient's questions and coordinating care.  Cc:  [name], MD

## 2022-12-22 ENCOUNTER — Encounter: Payer: Self-pay | Admitting: Neurology

## 2022-12-22 ENCOUNTER — Ambulatory Visit (INDEPENDENT_AMBULATORY_CARE_PROVIDER_SITE_OTHER): Payer: Medicare Other | Admitting: Neurology

## 2022-12-22 VITALS — BP 154/92 | HR 78 | Ht 73.0 in | Wt 210.4 lb

## 2022-12-22 DIAGNOSIS — T451X5A Adverse effect of antineoplastic and immunosuppressive drugs, initial encounter: Secondary | ICD-10-CM | POA: Diagnosis not present

## 2022-12-22 DIAGNOSIS — G620 Drug-induced polyneuropathy: Secondary | ICD-10-CM

## 2022-12-22 DIAGNOSIS — C9000 Multiple myeloma not having achieved remission: Secondary | ICD-10-CM | POA: Diagnosis not present

## 2022-12-22 DIAGNOSIS — G250 Essential tremor: Secondary | ICD-10-CM | POA: Diagnosis not present

## 2022-12-22 NOTE — Patient Instructions (Signed)
Essential Tremor ?A tremor is trembling or shaking that a person cannot control. Most tremors affect the hands or arms. Tremors can also affect the head, vocal cords, legs, and other parts of the body. Essential tremor is a tremor without a known cause. Usually, it occurs while a person is trying to perform an action. It tends to get worse gradually as a person ages. ?What are the causes? ?The cause of this condition is not known, but it often runs in families. ?What increases the risk? ?You are more likely to develop this condition if: ?You have a family member with essential tremor. ?You are 40 years of age or older. ?What are the signs or symptoms? ?The main sign of a tremor is a rhythmic shaking of certain parts of your body that is uncontrolled and unintentional. You may: ?Have difficulty eating with a spoon or fork. ?Have difficulty writing. ?Nod your head up and down or side to side. ?Have a quivering voice. ?The shaking may: ?Get worse over time. ?Come and go. ?Be more noticeable on one side of your body. ?Get worse due to stress, tiredness (fatigue), caffeine, and extreme heat or cold. ?How is this diagnosed? ?This condition may be diagnosed based on: ?Your symptoms and medical history. ?A physical exam. ?There is no single test to diagnose an essential tremor. However, your health care provider may order tests to rule out other causes of your condition. These may include: ?Blood and urine tests. ?Imaging studies of your brain, such as a CT scan or MRI. ?How is this treated? ?Treatment for essential tremor depends on the severity of the condition. ?Mild tremors may not need treatment if they do not affect your day-to-day life. ?Severe tremors may need to be treated using one or more of the following options: ?Medicines. ?Injections of a substance called botulinum toxin. ?Procedures such as deep brain stimulation (DBS) implantation or MRI-guided ultrasound treatment. ?Lifestyle changes. ?Occupational or  physical therapy. ?Follow these instructions at home: ?Lifestyle ? ?Do not use any products that contain nicotine or tobacco. These products include cigarettes, chewing tobacco, and vaping devices, such as e-cigarettes. If you need help quitting, ask your health care provider. ?Limit your caffeine intake as told by your health care provider. ?Try to get 8 hours of sleep each night. ?Find ways to manage your stress that fit your lifestyle and personality. Consider trying meditation or yoga. ?Try to anticipate stressful situations and allow extra time to manage them. ?If you are struggling emotionally with the effects of your tremor, consider working with a mental health provider. ?General instructions ?Take over-the-counter and prescription medicines only as told by your health care provider. ?Avoid extreme heat and extreme cold. ?Keep all follow-up visits. This is important. Visits may include physical therapy visits. ?Where to find more information ?National Institute of Neurological Disorders and Stroke: www.ninds.nih.gov ?Contact a health care provider if: ?You experience any changes in the location or intensity of your tremors. ?You start having a tremor after starting a new medicine. ?You have a tremor with other symptoms, such as: ?Numbness. ?Tingling. ?Pain. ?Weakness. ?Your tremor gets worse. ?Your tremor interferes with your daily life. ?You feel down, blue, or sad for at least 2 weeks in a row. ?Worrying about your tremor and what other people think about you interferes with your everyday life functions, including relationships, work, or school. ?Summary ?Essential tremor is a tremor without a known cause. Usually, it occurs when you are trying to perform an action. ?You are more likely   to develop this condition if you have a family member with essential tremor. ?The main sign of a tremor is a rhythmic shaking of certain parts of your body that is uncontrolled and unintentional. ?Treatment for essential  tremor depends on the severity of the condition. ?This information is not intended to replace advice given to you by your health care provider. Make sure you discuss any questions you have with your health care provider. ?Document Revised: 08/30/2021 Document Reviewed: 08/30/2021 ?Elsevier Patient Education ? 2023 [employer]. ? ?

## 2022-12-23 ENCOUNTER — Telehealth: Payer: Self-pay | Admitting: Neurology

## 2022-12-23 MED ORDER — PROPRANOLOL HCL 20 MG PO TABS: 20.0000 mg | ORAL_TABLET | Freq: Two times a day (BID) | ORAL | 1 refills | Status: DC

## 2022-12-23 NOTE — Telephone Encounter (Signed)
Called patient he understood directions front desk is calling to make a 6 month appointment

## 2022-12-23 NOTE — Telephone Encounter (Signed)
I spoke with Dr. Carlota Raspberry and value his input.  We will cautiously add propranolol 20 mg bid. Tell patient to watch BP and let us know if he feels lightheaded, faint, etc.  I sent the script to [facility] in [place].  Also, make him a f/u in 6 months

## 2022-12-31 ENCOUNTER — Other Ambulatory Visit: Payer: Self-pay | Admitting: Hematology

## 2022-12-31 DIAGNOSIS — C9000 Multiple myeloma not having achieved remission: Secondary | ICD-10-CM

## 2023-01-01 ENCOUNTER — Encounter: Payer: Self-pay | Admitting: Hematology

## 2023-01-01 ENCOUNTER — Other Ambulatory Visit: Payer: Self-pay

## 2023-01-05 ENCOUNTER — Other Ambulatory Visit (HOSPITAL_COMMUNITY): Payer: Self-pay

## 2023-01-05 ENCOUNTER — Telehealth: Payer: Self-pay | Admitting: Pharmacy Technician

## 2023-01-05 NOTE — Telephone Encounter (Addendum)
Oral Oncology Patient Advocate Encounter   Received notification that prior authorization for Lenalidomide (Revlimid) is due for renewal.   PA submitted on 01/05/23 Submitted via e-fax to [phone] Status is pending     Nathan Martinez, CPhT-Adv Oncology Pharmacy Patient [place] Direct Number: [phone]  Fax: [phone]

## 2023-01-06 ENCOUNTER — Other Ambulatory Visit (HOSPITAL_COMMUNITY): Payer: Self-pay

## 2023-01-06 NOTE — Telephone Encounter (Signed)
Oral Oncology Patient Advocate Encounter  Prior Authorization for Revlimid (lenalidomide) has been approved.    PA# [id number] Effective dates: 12/06/22 through 01/05/24  Patient must fill at [facility].    Lady Deutscher, CPhT-Adv Oncology Pharmacy Patient [place] Direct Number: [phone]  Fax: [phone]

## 2023-01-18 ENCOUNTER — Other Ambulatory Visit: Payer: Self-pay | Admitting: Hematology

## 2023-01-18 DIAGNOSIS — C9000 Multiple myeloma not having achieved remission: Secondary | ICD-10-CM

## 2023-01-18 DIAGNOSIS — Z7189 Other specified counseling: Secondary | ICD-10-CM

## 2023-01-24 ENCOUNTER — Other Ambulatory Visit: Payer: Self-pay | Admitting: Hematology

## 2023-01-24 DIAGNOSIS — D472 Monoclonal gammopathy: Secondary | ICD-10-CM

## 2023-01-28 ENCOUNTER — Other Ambulatory Visit: Payer: Self-pay | Admitting: Hematology

## 2023-01-28 DIAGNOSIS — C9000 Multiple myeloma not having achieved remission: Secondary | ICD-10-CM

## 2023-02-06 ENCOUNTER — Other Ambulatory Visit: Payer: Self-pay | Admitting: *Deleted

## 2023-02-06 DIAGNOSIS — C9000 Multiple myeloma not having achieved remission: Secondary | ICD-10-CM

## 2023-02-09 ENCOUNTER — Inpatient Hospital Stay (HOSPITAL_BASED_OUTPATIENT_CLINIC_OR_DEPARTMENT_OTHER): Payer: Medicare Other | Admitting: Hematology

## 2023-02-09 ENCOUNTER — Inpatient Hospital Stay: Payer: Medicare Other

## 2023-02-09 ENCOUNTER — Inpatient Hospital Stay: Payer: Medicare Other | Attending: Hematology

## 2023-02-09 VITALS — BP 169/68 | HR 54 | Temp 97.5°F | Resp 20 | Wt 217.9 lb

## 2023-02-09 DIAGNOSIS — C9000 Multiple myeloma not having achieved remission: Secondary | ICD-10-CM

## 2023-02-09 DIAGNOSIS — C9001 Multiple myeloma in remission: Secondary | ICD-10-CM | POA: Diagnosis not present

## 2023-02-09 LAB — CMP (CANCER CENTER ONLY)
ALT: 21 U/L (ref 0–44)
AST: 22 U/L (ref 15–41)
Albumin: 3.7 g/dL (ref 3.5–5.0)
Alkaline Phosphatase: 30 U/L — ABNORMAL LOW (ref 38–126)
Anion gap: 2 — ABNORMAL LOW (ref 5–15)
BUN: 15 mg/dL (ref 8–23)
CO2: 30 mmol/L (ref 22–32)
Calcium: 10.3 mg/dL (ref 8.9–10.3)
Chloride: 108 mmol/L (ref 98–111)
Creatinine: 1.09 mg/dL (ref 0.61–1.24)
GFR, Estimated: >60 mL/min (ref >60)
Glucose, Bld: 106 mg/dL — ABNORMAL HIGH (ref 70–99)
Potassium: 4.8 mmol/L (ref 3.5–5.1)
Sodium: 140 mmol/L (ref 135–145)
Total Bilirubin: 0.5 mg/dL (ref 0.3–1.2)
Total Protein: 6.3 g/dL — ABNORMAL LOW (ref 6.5–8.1)

## 2023-02-09 LAB — CBC WITH DIFFERENTIAL (CANCER CENTER ONLY)
Abs Immature Granulocytes: 0.01 10*3/uL (ref 0.00–0.07)
Basophils Absolute: 0 10*3/uL (ref 0.0–0.1)
Basophils Relative: 1 %
Eosinophils Absolute: 0.1 10*3/uL (ref 0.0–0.5)
Eosinophils Relative: 3 %
HCT: 41.9 % (ref 39.0–52.0)
Hemoglobin: 13.7 g/dL (ref 13.0–17.0)
Immature Granulocytes: 0 %
Lymphocytes Relative: 28 %
Lymphs Abs: 0.9 10*3/uL (ref 0.7–4.0)
MCH: 28 pg (ref 26.0–34.0)
MCHC: 32.7 g/dL (ref 30.0–36.0)
MCV: 85.5 fL (ref 80.0–100.0)
Monocytes Absolute: 0.5 10*3/uL (ref 0.1–1.0)
Monocytes Relative: 15 %
Neutro Abs: 1.8 10*3/uL (ref 1.7–7.7)
Neutrophils Relative %: 53 %
Platelet Count: 217 10*3/uL (ref 150–400)
RBC: 4.9 MIL/uL (ref 4.22–5.81)
RDW: 14.9 % (ref 11.5–15.5)
WBC Count: 3.4 10*3/uL — ABNORMAL LOW (ref 4.0–10.5)
nRBC: 0 % (ref 0.0–0.2)

## 2023-02-09 MED ORDER — SODIUM CHLORIDE 0.9 % IV SOLN: Freq: Once | INTRAVENOUS | Status: AC

## 2023-02-09 MED ORDER — ZOLEDRONIC ACID 4 MG/100ML IV SOLN
4.0000 mg | Freq: Once | INTRAVENOUS | Status: AC
  Administered 2023-02-09: 4 mg via INTRAVENOUS
  Filled 2023-02-09: qty 100

## 2023-02-09 NOTE — Patient Instructions (Signed)

## 2023-02-09 NOTE — Progress Notes (Signed)
Nathan Martinez   HEMATOLOGY/ONCOLOGY CLINIC VISIT NOTE   Date of Service: 02/09/23  Patient Care Team: [name], MD as PCP - General (Family Medicine) [name], MD as Consulting Physician (Hematology)  CHIEF COMPLAINTS/PURPOSE OF CONSULTATION:  Follow-up for continued evaluation and management of multiple myeloma  HISTORY OF PRESENTING ILLNESS:  Please see previous note for details of initial presentation  [name] is here for continued evaluation and management of multiple myeloma.  Patient was last seen by me on 12/08/2022 and he was doing well overall.   Patient is accompanied by his wife during this visit. He reports he has been doing well overall without any new medical concerns since our last visit. He notes that his neuropathy is improving since our last visit.   He has been to Neurologist since our last visit regarding tremors and he has been prescribed propranolol which has been helping him control his tremors.   He is complaint with his other medications.   Patient is regularly taking Revlimid 10 mg without any toxicities.   He denies fever, chills, night sweats, infection issues, unexpected weight loss, fatigue, abdominal pain, chest pain, back pain, or leg swelling.   MEDICAL HISTORY:  Intermittent hypercalcemia - primary hyperparathyroidism status post parathyroidectomy in 2019 HTN HLD Glaucoma History of Helicobacter pylori infection previously treated Chronic leukopenia/neutropenia History of present iron deficiency anemia Acute DVT  SURGICAL HISTORY: Rt TKA 2018 Parathyroidectomy 2019 B/l cataracts sx 2021  SOCIAL HISTORY: Social History   Socioeconomic History   Marital status: Married    Spouse name: Not on file   Number of children: Not on file   Years of education: Not on file   Highest education level: Not on file  Occupational History   Occupation: retired    Comment: Facilities manager  Tobacco Use    Smoking status: Never   Smokeless tobacco: Never  Vaping Use   Vaping Use: Never used  Substance and Sexual Activity   Alcohol use: Not Currently   Drug use: Never   Sexual activity: Not Currently    Birth control/protection: None  Other Topics Concern   Not on file  Social History Narrative   Right handed   retired   Investment banker, operational of Radio broadcast assistant Strain: Not on file  Food Insecurity: Not on file  Transportation Needs: Not on file  Physical Activity: Not on file  Stress: Not on file  Social Connections: Not on file  Intimate Partner Violence: Not on file  Ex smoker (quit 1976) Ex secret service agent ETOH social use  FAMILY HISTORY: Sister - uterine/ovarian cancer 78's  Mother-- lived into her 30's Father - died in 46's - fall/trauma  ALLERGIES:  has No Known Allergies.  MEDICATIONS:  Current Outpatient Medications on File Prior to Visit  Medication Sig Dispense Refill   acyclovir (ZOVIRAX) 400 MG tablet Take 1 tablet by mouth twice daily 60 tablet 0   apixaban (ELIQUIS) 5 MG TABS tablet Take 1 tablet (5 mg total) by mouth 2 (two) times daily. 60 tablet 5   atorvastatin (LIPITOR) 20 MG tablet Take 1 tablet (20 mg total) by mouth at bedtime. 90 tablet 1   ergocalciferol (VITAMIN D2) 1.25 MG (50000 UT) capsule Take 1 capsule (50,000 Units total) by mouth once a week. 30 capsule 0   erythromycin ophthalmic ointment Place 1 Application into both eyes in the morning and at bedtime. 3.5 g 1   FERREX 150 150 MG  capsule Take 1 capsule by mouth once daily 90 capsule 0   gabapentin (NEURONTIN) 100 MG capsule Take 1 capsule (100 mg total) by mouth 2 (two) times daily. Start 1 at bedtime for first week, then increase if tolerated. 60 capsule 1   Krill Oil (OMEGA-3) 500 MG CAPS      latanoprost (XALATAN) 0.005 % ophthalmic solution SMARTSIG:1 Drop(s) In Eye(s) Every Evening     loratadine (CLARITIN) 10 MG tablet Take 10 mg by mouth daily.     Magnesium Oxide  400 MG CAPS      Multiple Vitamins-Minerals (MULTIVITAMIN MEN 50+) TABS Take by mouth.     olmesartan (BENICAR) 20 MG tablet Take 1 tablet (20 mg total) by mouth daily. 90 tablet 1   Olopatadine HCl 0.2 % SOLN Apply 1 drop to eye daily. 2.5 mL 1   pregabalin (LYRICA) 25 MG capsule TAKE 1 CAPSULE BY MOUTH TWICE DAILY AS NEEDED (Patient not taking: Reported on 12/08/2022) 30 capsule 0   propranolol (INDERAL) 20 MG tablet Take 1 tablet (20 mg total) by mouth 2 (two) times daily. 180 tablet 1   REVLIMID 10 MG capsule TAKE 1 CAPSULE BY MOUTH 1 TIME A DAY FOR 21 DAYS ON AND 7 DAYS OFF 21 capsule 0   No current facility-administered medications on file prior to visit.     REVIEW OF SYSTEMS:   10 Point review of Systems was done is negative except as noted above.  PHYSICAL EXAMINATION: .BP (!) 169/68   Pulse (!) 54   Temp (!) 97.5 F (36.4 C)   Resp 20   Wt 217 lb 14.4 oz (98.8 kg)   SpO2 100%   BMI 28.75 kg/m   NAD GENERAL:alert, in no acute distress and comfortable SKIN: no acute rashes, no significant lesions EYES: conjunctiva are pink and non-injected, sclera anicteric OROPHARYNX: MMM, no exudates, no oropharyngeal erythema or ulceration NECK: supple, no JVD LYMPH:  no palpable lymphadenopathy in the cervical, axillary or inguinal regions LUNGS: clear to auscultation b/l with normal respiratory effort HEART: regular rate & rhythm ABDOMEN:  normoactive bowel sounds , non tender, not distended. Extremity: no pedal edema PSYCH: alert & oriented x 3 with fluent speech NEURO: no focal motor/sensory deficits   LABORATORY DATA:  I have reviewed the data as listed  .    Latest Ref Rng & Units 02/09/2023   12:24 PM 12/08/2022    1:21 PM 10/03/2022    2:00 PM  CBC  WBC 4.0 - 10.5 K/uL 3.4  2.4  2.1   Hemoglobin 13.0 - 17.0 g/dL 13.7  13.9  13.2   Hematocrit 39.0 - 52.0 % 41.9  40.8  40.0   Platelets 150 - 400 K/uL 217  131  158    CBC    Component Value Date/Time   WBC 3.4  (L) 02/09/2023 1224   WBC 5.1 08/11/2022 0807   RBC 4.90 02/09/2023 1224   HGB 13.7 02/09/2023 1224   HCT 41.9 02/09/2023 1224   PLT 217 02/09/2023 1224   MCV 85.5 02/09/2023 1224   MCH 28.0 02/09/2023 1224   MCHC 32.7 02/09/2023 1224   RDW 14.9 02/09/2023 1224   LYMPHSABS 0.9 02/09/2023 1224   MONOABS 0.5 02/09/2023 1224   EOSABS 0.1 02/09/2023 1224   BASOSABS 0.0 02/09/2023 1224    .    Latest Ref Rng & Units 02/09/2023   12:24 PM 12/08/2022    1:21 PM 10/03/2022    2:00 PM  CMP  Glucose 70 - 99 mg/dL 106  82  91   BUN 8 - 23 mg/dL 15  15  13    Creatinine 0.61 - 1.24 mg/dL 1.09  1.06  1.14   Sodium 135 - 145 mmol/L 140  138  140   Potassium 3.5 - 5.1 mmol/L 4.8  4.2  4.0   Chloride 98 - 111 mmol/L 108  106  107   CO2 22 - 32 mmol/L 30  30  30    Calcium 8.9 - 10.3 mg/dL 10.3  10.4  10.3   Total Protein 6.5 - 8.1 g/dL 6.3  6.4  6.8   Total Bilirubin 0.3 - 1.2 mg/dL 0.5  0.8  1.0   Alkaline Phos 38 - 126 U/L 30  27  30    AST 15 - 41 U/L 22  25  23    ALT 0 - 44 U/L 21  26  21       IEOP/Immunofixation Order: [id number] Component 9 d ago  PATHOLOGIST CHARGE ID  [id number] - Jamison Neighbor, M.D.   SERUM IEOP COMMENT   NO DEFINITE CLONALITY DETECTED.   Reviewed and Interpreted By   Jamison Neighbor, M.D.       RADIOGRAPHIC STUDIES:   [place] lower extremity venous ([id number]) done 08/19/2022 revealed "Summary:  RIGHT:  - Findings consistent with acute deep vein thrombosis involving the right  proximal, mid, and distal femoral vein, right popliteal vein, and right  peroneal veins.  - No cystic structure found in the popliteal fossa.     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 Most recent PET restaging scan done 08/13/2022 showed no overt signs of disease progression. Myeloma FISH panel no reported mutations. Cytogenetics-loss of Y chromosome.  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 Recent labs have shown resolution of his leukopenia/neutropenia.  We will continue to monitor on Revlimid.  3) h/o DVT of the right lower extremity Ultrasound right lower extremity 08/19/2022- Findings consistent with acute deep vein thrombosis involving the right  proximal, mid, and distal femoral vein, right popliteal vein, and right  peroneal veins.  Resolved and continues to be on anticoagulation.  4) history of stye -continue f/u with ophthamology Currently not an acute issue and has resolved since the patient has been off Velcade.  PLAN: - labs from today were discussed in details. Cbc and cmp stable Myeloma panel shows no M spike and normal Serum K/L ratio. -Advised patient to start B-Complex supplement and continue Vitamin-D supplement. -Patient denies any toxicities with Revlimid.  -Answered all of patient's questions.  -Continue Revlimid maintenance at 10 mg p.o. 3 weeks on 1 week off. -Avoiding dehydration and drinking at least 2 L of water daily. Continue Zometa every 8 weeks  -Continue Eliquis 5 mg p.o. twice daily.while on Revlimid.   FOLLOW-UP: Labs in 8 weeks RTC with Dr Irene Limbo and for next Zometa infusion in 9 weeks  The total time spent in the appointment was 25 minutes* .  All of the patient's questions were answered with apparent satisfaction. The patient knows to call the  clinic with any problems, questions or concerns.   Sullivan Lone MD MS AAHIVMS [facility] Hematology/Oncology Physician [facility]  .*Total Encounter Time as defined by the Centers for Medicare and Medicaid Services includes, in addition to the face-to-face time of a patient visit (documented in the note above) non-face-to-face time: obtaining and reviewing outside history, ordering and reviewing medications, tests or procedures, care coordination (communications with other health care professionals or caregivers) and documentation in the medical record.   I, Cleda Mccreedy, am acting as a Education administrator for Sullivan Lone, MD. .I have reviewed the above documentation for accuracy and completeness, and I agree with the above. [name] MD

## 2023-02-09 NOTE — Progress Notes (Signed)
Patient seen by MD today  Vitals are within treatment parameters.  Labs reviewed: and are within treatment parameters.  Per physician team, patient is ready for treatment and there are NO modifications to the treatment plan.  

## 2023-02-10 ENCOUNTER — Telehealth: Payer: Self-pay | Admitting: Hematology

## 2023-02-10 LAB — KAPPA/LAMBDA LIGHT CHAINS
Kappa free light chain: 24.2 mg/L — ABNORMAL HIGH (ref 3.3–19.4)
Kappa, lambda light chain ratio: 1.61 (ref 0.26–1.65)
Lambda free light chains: 15 mg/L (ref 5.7–26.3)

## 2023-02-10 NOTE — Telephone Encounter (Signed)
Called patient per 3/18 los notes to schedule f/u. Patient scheduled and notified.

## 2023-02-12 ENCOUNTER — Telehealth: Payer: Self-pay | Admitting: Family Medicine

## 2023-02-12 NOTE — Telephone Encounter (Signed)
Contacted Dravin J Beidleman to schedule their annual wellness visit. Appointment made for 02/18/2023.  Thank you,  [place] Direct dial  [phone]

## 2023-02-13 LAB — MULTIPLE MYELOMA PANEL, SERUM
Albumin SerPl Elph-Mcnc: 3.6 g/dL (ref 2.9–4.4)
Albumin/Glob SerPl: 1.4 (ref 0.7–1.7)
Alpha 1: 0.2 g/dL (ref 0.0–0.4)
Alpha2 Glob SerPl Elph-Mcnc: 0.6 g/dL (ref 0.4–1.0)
B-Globulin SerPl Elph-Mcnc: 0.7 g/dL (ref 0.7–1.3)
Gamma Glob SerPl Elph-Mcnc: 1.1 g/dL (ref 0.4–1.8)
Globulin, Total: 2.6 g/dL (ref 2.2–3.9)
IgA: 113 mg/dL (ref 61–437)
IgG (Immunoglobin G), Serum: 1284 mg/dL (ref 603–1613)
IgM (Immunoglobulin M), Srm: 14 mg/dL — ABNORMAL LOW (ref 15–143)
Total Protein ELP: 6.2 g/dL (ref 6.0–8.5)

## 2023-02-15 ENCOUNTER — Encounter: Payer: Self-pay | Admitting: Hematology

## 2023-02-18 ENCOUNTER — Ambulatory Visit (INDEPENDENT_AMBULATORY_CARE_PROVIDER_SITE_OTHER): Payer: Medicare Other | Admitting: Family Medicine

## 2023-02-18 DIAGNOSIS — Z0000 Encounter for general adult medical examination without abnormal findings: Secondary | ICD-10-CM | POA: Diagnosis not present

## 2023-02-18 NOTE — Progress Notes (Addendum)
Subjective:   Nathan Martinez is a 77 y.o. male who presents for an Initial Medicare Annual Wellness Visit.  I connected with  Nathan Martinez on 02/18/23 by a telephone enabled telemedicine application and verified that I am speaking with the correct person using two identifiers.   I discussed the limitations of evaluation and management by telemedicine. The patient expressed understanding and agreed to proceed.  Patient location: home  Provider location: telephone home     Review of Systems     Cardiac Risk Factors include: advanced age (>66men, >6 women);obesity (BMI >30kg/m2);male gender     Objective:    Today's Vitals   There is no height or weight on file to calculate BMI.     02/18/2023    8:41 AM 12/22/2022   10:05 AM 08/27/2022    9:08 AM 08/11/2022    7:21 AM 01/28/2022    9:45 AM  Advanced Directives  Does Patient Have a Medical Advance Directive? Yes Yes Yes Yes Yes  Type of Academic librarian Living will [place];Living will [place];Living will [place];Living will  Does patient want to make changes to medical advance directive? Yes (Inpatient - patient requests chaplain consult to change a medical advance directive)  No - Patient declined  No - Patient declined  Copy of [place] in Chart? Yes - validated most recent copy scanned in chart (See row information)  No - copy requested  No - copy requested    Current Medications (verified) Outpatient Encounter Medications as of 02/18/2023  Medication Sig   acyclovir (ZOVIRAX) 400 MG tablet Take 1 tablet by mouth twice daily   apixaban (ELIQUIS) 5 MG TABS tablet Take 1 tablet (5 mg total) by mouth 2 (two) times daily.   atorvastatin (LIPITOR) 20 MG tablet Take 1 tablet (20 mg total) by mouth at bedtime.   ergocalciferol (VITAMIN D2) 1.25 MG (50000 UT) capsule Take 1 capsule (50,000 Units total) by mouth once a  week.   erythromycin ophthalmic ointment Place 1 Application into both eyes in the morning and at bedtime.   FERREX 150 150 MG capsule Take 1 capsule by mouth once daily   gabapentin (NEURONTIN) 100 MG capsule Take 1 capsule (100 mg total) by mouth 2 (two) times daily. Start 1 at bedtime for first week, then increase if tolerated.   Krill Oil (OMEGA-3) 500 MG CAPS    latanoprost (XALATAN) 0.005 % ophthalmic solution SMARTSIG:1 Drop(s) In Eye(s) Every Evening   loratadine (CLARITIN) 10 MG tablet Take 10 mg by mouth daily.   Magnesium Oxide 400 MG CAPS    Multiple Vitamins-Minerals (MULTIVITAMIN MEN 50+) TABS Take by mouth.   olmesartan (BENICAR) 20 MG tablet Take 1 tablet (20 mg total) by mouth daily.   Olopatadine HCl 0.2 % SOLN Apply 1 drop to eye daily.   propranolol (INDERAL) 20 MG tablet Take 1 tablet (20 mg total) by mouth 2 (two) times daily.   REVLIMID 10 MG capsule TAKE 1 CAPSULE BY MOUTH 1 TIME A DAY FOR 21 DAYS ON AND 7 DAYS OFF   pregabalin (LYRICA) 25 MG capsule TAKE 1 CAPSULE BY MOUTH TWICE DAILY AS NEEDED (Patient not taking: Reported on 02/18/2023)   No facility-administered encounter medications on file as of 02/18/2023.    Allergies (verified) Patient has no known allergies.   History: Past Medical History:  Diagnosis Date   DVT (deep vein thrombosis) in pregnancy  Hyperlipidemia    Hypertension    Multiple myeloma (HCC)    Peripheral neuropathy    Past Surgical History:  Procedure Laterality Date   CATARACT EXTRACTION, BILATERAL     KNEE SURGERY Right 2018   PARATHYROID EXPLORATION     Family History  Problem Relation Age of Onset   Tremor Mother    Cancer Sister    Social History   Socioeconomic History   Marital status: Married    Spouse name: Not on file   Number of children: Not on file   Years of education: Not on file   Highest education level: Not on file  Occupational History   Occupation: retired    Comment: Facilities manager  Tobacco Use    Smoking status: Never   Smokeless tobacco: Never  Vaping Use   Vaping Use: Never used  Substance and Sexual Activity   Alcohol use: Not Currently   Drug use: Never   Sexual activity: Not Currently    Birth control/protection: None  Other Topics Concern   Not on file  Social History Narrative   Right handed   retired   Investment banker, operational of [place] Strain: [place]  (02/18/2023)   Overall Financial Resource Strain (CARDIA)    Difficulty of Paying Living Expenses: Not hard at all  Food Insecurity: No Food Insecurity (02/18/2023)   Hunger Vital Sign    Worried About Running Out of Food in the Last Year: Never true    [place] in the Last Year: Never true  Transportation Needs: No Transportation Needs (02/18/2023)   PRAPARE - Hydrologist (Medical): No    Lack of Transportation (Non-Medical): No  Physical Activity: Inactive (02/18/2023)   Exercise Vital Sign    Days of Exercise per Week: 0 days    Minutes of Exercise per Session: 0 min  Stress: No Stress Concern Present (02/18/2023)   [place]    Feeling of Stress : Not at all  Social Connections: [place] (02/18/2023)   Social Connection and Isolation Panel [NHANES]    Frequency of Communication with Friends and Family: More than three times a week    Frequency of Social Gatherings with Friends and Family: Three times a week    Attends Religious Services: More than 4 times per year    Active Member of Clubs or Organizations: Yes    Attends Music therapist: More than 4 times per year    Marital Status: Married    Tobacco Counseling Counseling given: Not Answered   Clinical Intake:  Pre-visit preparation completed: Yes  Pain : No/denies pain     Nutritional Risks: None Diabetes: No  How often do you need to have someone help you when you read instructions, pamphlets, or  other written materials from your doctor or pharmacy?: 1 - Never  Diabetic?  no  Interpreter Needed?: No  Information entered by :: Leroy Kennedy LPN   Activities of Daily Living    02/18/2023    8:47 AM 02/14/2023    8:49 AM  In your present state of health, do you have any difficulty performing the following activities:  Hearing? 0 0  Vision? 0 0  Difficulty concentrating or making decisions? 0 0  Walking or climbing stairs? 0 0  Dressing or bathing? 0 0  Doing errands, shopping? 0 0  Preparing Food and eating ? N N  Using  the Toilet? N N  In the past six months, have you accidently leaked urine? N N  Do you have problems with loss of bowel control? N N  Managing your Medications? N N  Managing your Finances? N N  Housekeeping or managing your Housekeeping? N N    Patient Care Team: [name], MD as PCP - General (Family Medicine) [name], MD as Consulting Physician (Hematology)  Indicate any recent Medical Services you may have received from other than [facility] providers in the past year (date may be approximate).     Assessment:   This is a routine wellness examination for Katie.  Hearing/Vision screen Hearing Screening - Comments:: No trouble hearing Vision Screening - Comments:: Up to date [facility]  Dietary issues and exercise activities discussed: Current Exercise Habits: The patient does not participate in regular exercise at present, Exercise limited by: neurologic condition(s)   Goals Addressed             This Visit's Progress    Patient Stated       Would like to get his feet back to normal        Depression Screen    02/18/2023    8:45 AM 11/10/2022    8:17 AM 08/25/2022    4:00 PM 05/09/2022    8:01 AM  PHQ 2/9 Scores  PHQ - 2 Score 0 0 0 0  PHQ- 9 Score 0 2 1     Fall Risk    02/18/2023    8:55 AM 02/18/2023    8:40 AM 02/14/2023    8:49 AM 12/22/2022   10:05 AM 11/10/2022    8:19 AM  Fall Risk   Falls in  the past year? 0 0 0 0 0  Number falls in past yr: 0 0 0 0 0  Injury with Fall? 0 0 0 0 0  Risk for fall due to : Impaired balance/gait Impaired balance/gait   No Fall Risks  Follow up Falls evaluation completed;Education provided;Falls prevention discussed Falls evaluation completed;Education provided;Falls prevention discussed  Falls evaluation completed Falls evaluation completed    FALL RISK PREVENTION PERTAINING TO THE HOME:  Any stairs in or around the home? Yes  If so, are there any without handrails? No  Home free of loose throw rugs in walkways, pet beds, electrical cords, etc? Yes  Adequate lighting in your home to reduce risk of falls? Yes   ASSISTIVE DEVICES UTILIZED TO PREVENT FALLS:  Life alert? No  Use of a cane, walker or w/c? No  Grab bars in the bathroom? No  Shower chair or bench in shower? No  Elevated toilet seat or a handicapped toilet? Yes   TIMED UP AND GO:  Was the test performed? No .    Cognitive Function:        02/18/2023    8:42 AM  6CIT Screen  What Year? 0 points  What month? 0 points  What time? 0 points  Count back from 20 0 points  Months in reverse 0 points  Repeat phrase 0 points  Total Score 0 points    Immunizations Immunization History  Administered Date(s) Administered   Fluad Quad(high Dose 65+) 08/25/2022   Influenza-Unspecified 08/13/2021   Moderna SARS-COV2 Booster Vaccination 06/26/2022   PFIZER(Purple Top)SARS-COV-2 Vaccination 12/21/2019, 01/11/2020, 08/20/2020, 01/23/2021   Pfizer Covid-19 Vaccine Bivalent Booster 40yrs & up 10/08/2022   Pneumococcal Conjugate-13 08/14/2015   Pneumococcal Polysaccharide-23 10/21/2013, 02/04/2016, 02/23/2021   RSV,unspecified 09/01/2022  Tdap 07/20/2014   Zoster Recombinat (Shingrix) 08/17/2020   Zoster, Unspecified 07/28/2020, 09/03/2022    TDAP status: Up to date  Flu Vaccine status: Up to date  Pneumococcal vaccine status: Up to date  Covid-19 vaccine status:  Information provided on how to obtain vaccines.   Qualifies for Shingles Vaccine? No   Zostavax completed Yes   Shingrix Completed?: Yes  Screening Tests Health Maintenance  Topic Date Due   COVID-19 Vaccine (6 - 2023-24 season) 12/03/2022   Hepatitis C Screening  02/18/2024 (Originally 12/25/1963)   Medicare Annual Wellness (AWV)  02/18/2024   DTaP/Tdap/Td (2 - Td or Tdap) 07/20/2024   Pneumonia Vaccine 17+ Years old  Completed   INFLUENZA VACCINE  Completed   Zoster Vaccines- Shingrix  Completed   HPV VACCINES  Aged Out    Health Maintenance  Health Maintenance Due  Topic Date Due   COVID-19 Vaccine (6 - 2023-24 season) 12/03/2022    Colorectal cancer screening: No longer required.   Lung Cancer Screening: (Low Dose CT Chest recommended if Age 51-80 years, 30 pack-year currently smoking OR have quit w/in 15years.) does not qualify.   Lung Cancer Screening Referral:   Additional Screening:  Hepatitis C Screening: does qualify;  Vision Screening: Recommended annual ophthalmology exams for early detection of glaucoma and other disorders of the eye. Is the patient up to date with their annual eye exam?  Yes  Who is the provider or what is the name of the office in which the patient attends annual eye exams? [facility] If pt is not established with a provider, would they like to be referred to a provider to establish care? No .   Dental Screening: Recommended annual dental exams for proper oral hygiene  Community Resource Referral / Chronic Care Management: CRR required this visit?  No   CCM required this visit?  No      Plan:     I have personally reviewed and noted the following in the patient's chart:   Medical and social history Use of alcohol, tobacco or illicit drugs  Current medications and supplements including opioid prescriptions. Patient is not currently taking opioid prescriptions. Functional ability and status Nutritional  status Physical activity Advanced directives List of other physicians Hospitalizations, surgeries, and ER visits in previous 12 months Vitals Screenings to include cognitive, depression, and falls Referrals and appointments  In addition, I have reviewed and discussed with patient certain preventive protocols, quality metrics, and best practice recommendations. A written personalized care plan for preventive services as well as general preventive health recommendations were provided to patient.     Leroy Kennedy, LPN   [id number]   Nurse Notes:

## 2023-02-18 NOTE — Patient Instructions (Signed)
Nathan Martinez , Thank you for taking time to come for your Medicare Wellness Visit. I appreciate your ongoing commitment to your health goals. Please review the following plan we discussed and let me know if I can assist you in the future.   Screening recommendations/referrals: Colonoscopy: no longer required Recommended yearly ophthalmology/optometry visit for glaucoma screening and checkup Recommended yearly dental visit for hygiene and checkup  Vaccinations: Influenza vaccine: up to date Pneumococcal vaccine: up to date Tdap vaccine: up to date Shingles vaccine: up to date    Advanced directives: up to date    Preventive Care 77 Years and Older, Male Preventive care refers to lifestyle choices and visits with your health care provider that can promote health and wellness. What does preventive care include? A yearly physical exam. This is also called an annual well check. Dental exams once or twice a year. Routine eye exams. Ask your health care provider how often you should have your eyes checked. Personal lifestyle choices, including: Daily care of your teeth and gums. Regular physical activity. Eating a healthy diet. Avoiding tobacco and drug use. Limiting alcohol use. Practicing safe sex. Taking low doses of aspirin every day. Taking vitamin and mineral supplements as recommended by your health care provider. What happens during an annual well check? The services and screenings done by your health care provider during your annual well check will depend on your age, overall health, lifestyle risk factors, and family history of disease. Counseling  Your health care provider may ask you questions about your: Alcohol use. Tobacco use. Drug use. Emotional well-being. Home and relationship well-being. Sexual activity. Eating habits. History of falls. Memory and ability to understand (cognition). Work and work Statistician. Screening  You may have the following tests or  measurements: Height, weight, and BMI. Blood pressure. Lipid and cholesterol levels. These may be checked every 5 years, or more frequently if you are over 55 years old. Skin check. Lung cancer screening. You may have this screening every year starting at age 42 if you have a 30-pack-year history of smoking and currently smoke or have quit within the past 15 years. Fecal occult blood test (FOBT) of the stool. You may have this test every year starting at age 23. Flexible sigmoidoscopy or colonoscopy. You may have a sigmoidoscopy every 5 years or a colonoscopy every 10 years starting at age 61. Prostate cancer screening. Recommendations will vary depending on your family history and other risks. Hepatitis C blood test. Hepatitis B blood test. Sexually transmitted disease (STD) testing. Diabetes screening. This is done by checking your blood sugar (glucose) after you have not eaten for a while (fasting). You may have this done every 1-3 years. Abdominal aortic aneurysm (AAA) screening. You may need this if you are a current or former smoker. Osteoporosis. You may be screened starting at age 30 if you are at high risk. Talk with your health care provider about your test results, treatment options, and if necessary, the need for more tests. Vaccines  Your health care provider may recommend certain vaccines, such as: Influenza vaccine. This is recommended every year. Tetanus, diphtheria, and acellular pertussis (Tdap, Td) vaccine. You may need a Td booster every 10 years. Zoster vaccine. You may need this after age 35. Pneumococcal 13-valent conjugate (PCV13) vaccine. One dose is recommended after age 33. Pneumococcal polysaccharide (PPSV23) vaccine. One dose is recommended after age 30. Talk to your health care provider about which screenings and vaccines you need and how often you need  them. This information is not intended to replace advice given to you by your health care provider. Make sure  you discuss any questions you have with your health care provider. Document Released: 12/07/2015 Document Revised: 07/30/2016 Document Reviewed: 09/11/2015 Elsevier Interactive Patient Education  2017 [place] Prevention in the Home Falls can cause injuries. They can happen to people of all ages. There are many things you can do to make your home safe and to help prevent falls. What can I do on the outside of my home? Regularly fix the edges of walkways and driveways and fix any cracks. Remove anything that might make you trip as you walk through a door, such as a raised step or threshold. Trim any bushes or trees on the path to your home. Use bright outdoor lighting. Clear any walking paths of anything that might make someone trip, such as rocks or tools. Regularly check to see if handrails are loose or broken. Make sure that both sides of any steps have handrails. Any raised decks and porches should have guardrails on the edges. Have any leaves, snow, or ice cleared regularly. Use sand or salt on walking paths during winter. Clean up any spills in your garage right away. This includes oil or grease spills. What can I do in the bathroom? Use night lights. Install grab bars by the toilet and in the tub and shower. Do not use towel bars as grab bars. Use non-skid mats or decals in the tub or shower. If you need to sit down in the shower, use a plastic, non-slip stool. Keep the floor dry. Clean up any water that spills on the floor as soon as it happens. Remove soap buildup in the tub or shower regularly. Attach bath mats securely with double-sided non-slip rug tape. Do not have throw rugs and other things on the floor that can make you trip. What can I do in the bedroom? Use night lights. Make sure that you have a light by your bed that is easy to reach. Do not use any sheets or blankets that are too big for your bed. They should not hang down onto the floor. Have a firm  chair that has side arms. You can use this for support while you get dressed. Do not have throw rugs and other things on the floor that can make you trip. What can I do in the kitchen? Clean up any spills right away. Avoid walking on wet floors. Keep items that you use a lot in easy-to-reach places. If you need to reach something above you, use a strong step stool that has a grab bar. Keep electrical cords out of the way. Do not use floor polish or wax that makes floors slippery. If you must use wax, use non-skid floor wax. Do not have throw rugs and other things on the floor that can make you trip. What can I do with my stairs? Do not leave any items on the stairs. Make sure that there are handrails on both sides of the stairs and use them. Fix handrails that are broken or loose. Make sure that handrails are as long as the stairways. Check any carpeting to make sure that it is firmly attached to the stairs. Fix any carpet that is loose or worn. Avoid having throw rugs at the top or bottom of the stairs. If you do have throw rugs, attach them to the floor with carpet tape. Make sure that you have a light switch at  the top of the stairs and the bottom of the stairs. If you do not have them, ask someone to add them for you. What else can I do to help prevent falls? Wear shoes that: Do not have high heels. Have rubber bottoms. Are comfortable and fit you well. Are closed at the toe. Do not wear sandals. If you use a stepladder: Make sure that it is fully opened. Do not climb a closed stepladder. Make sure that both sides of the stepladder are locked into place. Ask someone to hold it for you, if possible. Clearly mark and make sure that you can see: Any grab bars or handrails. First and last steps. Where the edge of each step is. Use tools that help you move around (mobility aids) if they are needed. These include: Canes. Walkers. Scooters. Crutches. Turn on the lights when you go  into a dark area. Replace any light bulbs as soon as they burn out. Set up your furniture so you have a clear path. Avoid moving your furniture around. If any of your floors are uneven, fix them. If there are any pets around you, be aware of where they are. Review your medicines with your doctor. Some medicines can make you feel dizzy. This can increase your chance of falling. Ask your doctor what other things that you can do to help prevent falls. This information is not intended to replace advice given to you by your health care provider. Make sure you discuss any questions you have with your health care provider. Document Released: 09/06/2009 Document Revised: 04/17/2016 Document Reviewed: 12/15/2014 Elsevier Interactive Patient Education  2017 [employer].

## 2023-02-22 ENCOUNTER — Other Ambulatory Visit: Payer: Self-pay | Admitting: Family Medicine

## 2023-02-22 DIAGNOSIS — G620 Drug-induced polyneuropathy: Secondary | ICD-10-CM

## 2023-02-23 NOTE — Telephone Encounter (Signed)
Gabapentin 100 mg LOV: 02/18/23 Last Refill:11/25/22 Upcoming appt: 05/13/23

## 2023-02-24 NOTE — Telephone Encounter (Signed)
Gabapentin 100 mg   LOV: 02/18/23 Last Refill:11/25/22 /24  Pt states he is tolerating the Gabapentin well

## 2023-02-24 NOTE — Progress Notes (Signed)
Note reviewed, and agree with documentation and plan. I was available in office during the time of this wellness visit, or available by phone or text if needed and available for questions or concerns regarding visit or follow-up issues to be addressed.  Signed,   Merri Ray, MD [facility] 02/24/23 8:57 PM

## 2023-02-24 NOTE — Telephone Encounter (Signed)
Polyneuropathy discussed in December.  Option of Lyrica at that time as reported stomach upset with gabapentin.  I do see a refill for gabapentin in January.  Please make sure he is tolerating gabapentin, and if so I am okay refilling it.  Thanks.

## 2023-02-24 NOTE — Telephone Encounter (Signed)
Gabapentin 100 mg LOV: 02/18/23 Last Refill:11/25/22 Upcoming appt: 05/13/23

## 2023-02-25 ENCOUNTER — Other Ambulatory Visit: Payer: Self-pay | Admitting: Hematology

## 2023-02-25 DIAGNOSIS — C9000 Multiple myeloma not having achieved remission: Secondary | ICD-10-CM

## 2023-02-26 ENCOUNTER — Encounter: Payer: Self-pay | Admitting: Hematology

## 2023-02-26 ENCOUNTER — Other Ambulatory Visit: Payer: Self-pay

## 2023-02-26 DIAGNOSIS — C9000 Multiple myeloma not having achieved remission: Secondary | ICD-10-CM

## 2023-02-26 MED ORDER — REVLIMID 10 MG PO CAPS
ORAL_CAPSULE | ORAL | 0 refills | Status: DC
Start: 2023-02-26 — End: 2023-03-25

## 2023-03-07 ENCOUNTER — Other Ambulatory Visit: Payer: Self-pay | Admitting: Hematology

## 2023-03-25 ENCOUNTER — Other Ambulatory Visit: Payer: Self-pay | Admitting: Hematology

## 2023-03-25 DIAGNOSIS — C9000 Multiple myeloma not having achieved remission: Secondary | ICD-10-CM

## 2023-03-30 ENCOUNTER — Other Ambulatory Visit: Payer: Self-pay

## 2023-03-30 DIAGNOSIS — C9000 Multiple myeloma not having achieved remission: Secondary | ICD-10-CM

## 2023-03-31 ENCOUNTER — Inpatient Hospital Stay: Payer: Medicare Other | Attending: Hematology

## 2023-03-31 DIAGNOSIS — C9000 Multiple myeloma not having achieved remission: Secondary | ICD-10-CM

## 2023-03-31 LAB — CMP (CANCER CENTER ONLY)
ALT: 28 U/L (ref 0–44)
AST: 27 U/L (ref 15–41)
Albumin: 3.5 g/dL (ref 3.5–5.0)
Alkaline Phosphatase: 23 U/L — ABNORMAL LOW (ref 38–126)
Anion gap: 5 (ref 5–15)
BUN: 13 mg/dL (ref 8–23)
CO2: 27 mmol/L (ref 22–32)
Calcium: 9.7 mg/dL (ref 8.9–10.3)
Chloride: 108 mmol/L (ref 98–111)
Creatinine: 1.12 mg/dL (ref 0.61–1.24)
GFR, Estimated: >60 mL/min (ref >60)
Glucose, Bld: 78 mg/dL (ref 70–99)
Potassium: 4 mmol/L (ref 3.5–5.1)
Sodium: 140 mmol/L (ref 135–145)
Total Bilirubin: 1.2 mg/dL (ref 0.3–1.2)
Total Protein: 6 g/dL — ABNORMAL LOW (ref 6.5–8.1)

## 2023-03-31 LAB — CBC WITH DIFFERENTIAL (CANCER CENTER ONLY)
Abs Immature Granulocytes: 0.01 10*3/uL (ref 0.00–0.07)
Basophils Absolute: 0 10*3/uL (ref 0.0–0.1)
Basophils Relative: 1 %
Eosinophils Absolute: 0.1 10*3/uL (ref 0.0–0.5)
Eosinophils Relative: 6 %
HCT: 41.1 % (ref 39.0–52.0)
Hemoglobin: 13.6 g/dL (ref 13.0–17.0)
Immature Granulocytes: 0 %
Lymphocytes Relative: 32 %
Lymphs Abs: 0.7 10*3/uL (ref 0.7–4.0)
MCH: 27.9 pg (ref 26.0–34.0)
MCHC: 33.1 g/dL (ref 30.0–36.0)
MCV: 84.2 fL (ref 80.0–100.0)
Monocytes Absolute: 0.5 10*3/uL (ref 0.1–1.0)
Monocytes Relative: 22 %
Neutro Abs: 0.9 10*3/uL — ABNORMAL LOW (ref 1.7–7.7)
Neutrophils Relative %: 39 %
Platelet Count: 125 10*3/uL — ABNORMAL LOW (ref 150–400)
RBC: 4.88 MIL/uL (ref 4.22–5.81)
RDW: 15.2 % (ref 11.5–15.5)
WBC Count: 2.3 10*3/uL — ABNORMAL LOW (ref 4.0–10.5)
nRBC: 0 % (ref 0.0–0.2)

## 2023-04-01 LAB — KAPPA/LAMBDA LIGHT CHAINS
Kappa free light chain: 26.1 mg/L — ABNORMAL HIGH (ref 3.3–19.4)
Kappa, lambda light chain ratio: 1.65 (ref 0.26–1.65)
Lambda free light chains: 15.8 mg/L (ref 5.7–26.3)

## 2023-04-02 ENCOUNTER — Other Ambulatory Visit: Payer: Self-pay | Admitting: Hematology

## 2023-04-05 LAB — MULTIPLE MYELOMA PANEL, SERUM
Albumin SerPl Elph-Mcnc: 3.6 g/dL (ref 2.9–4.4)
Albumin/Glob SerPl: 1.6 (ref 0.7–1.7)
Alpha 1: 0.2 g/dL (ref 0.0–0.4)
Alpha2 Glob SerPl Elph-Mcnc: 0.5 g/dL (ref 0.4–1.0)
B-Globulin SerPl Elph-Mcnc: 0.7 g/dL (ref 0.7–1.3)
Gamma Glob SerPl Elph-Mcnc: 1 g/dL (ref 0.4–1.8)
Globulin, Total: 2.4 g/dL (ref 2.2–3.9)
IgA: 106 mg/dL (ref 61–437)
IgG (Immunoglobin G), Serum: 1168 mg/dL (ref 603–1613)
IgM (Immunoglobulin M), Srm: 15 mg/dL (ref 15–143)
Total Protein ELP: 6 g/dL (ref 6.0–8.5)

## 2023-04-07 ENCOUNTER — Inpatient Hospital Stay: Payer: Medicare Other

## 2023-04-07 ENCOUNTER — Inpatient Hospital Stay (HOSPITAL_BASED_OUTPATIENT_CLINIC_OR_DEPARTMENT_OTHER): Payer: Medicare Other | Admitting: Hematology

## 2023-04-07 ENCOUNTER — Other Ambulatory Visit: Payer: Self-pay

## 2023-04-07 VITALS — BP 171/77 | HR 60 | Temp 98.1°F | Resp 16 | Wt 218.2 lb

## 2023-04-07 DIAGNOSIS — C9000 Multiple myeloma not having achieved remission: Secondary | ICD-10-CM

## 2023-04-07 DIAGNOSIS — I82411 Acute embolism and thrombosis of right femoral vein: Secondary | ICD-10-CM

## 2023-04-07 DIAGNOSIS — C9001 Multiple myeloma in remission: Secondary | ICD-10-CM

## 2023-04-07 MED ORDER — ZOLEDRONIC ACID 4 MG/100ML IV SOLN
4.0000 mg | Freq: Once | INTRAVENOUS | Status: AC
  Administered 2023-04-07: 4 mg via INTRAVENOUS
  Filled 2023-04-07: qty 100

## 2023-04-07 MED ORDER — SODIUM CHLORIDE 0.9 % IV SOLN: Freq: Once | INTRAVENOUS | Status: AC

## 2023-04-07 NOTE — Progress Notes (Signed)
[name]   HEMATOLOGY/ONCOLOGY CLINIC VISIT NOTE   Date of Service: 04/07/23  Patient Care Team: Shade Flood, MD as PCP - General (Family Medicine) [name], MD as Consulting Physician (Hematology)  CHIEF COMPLAINTS/PURPOSE OF CONSULTATION:  Follow-up for continued evaluation and management of multiple myeloma  HISTORY OF PRESENTING ILLNESS:  Please see previous note for details of initial presentation  INTERVAL HISTORY  Nathan Martinez is here for continued evaluation and management of multiple myeloma.  Patient was last seen by me on 02/09/2023 and he was doing well overall.   Patient is accompanied by his wife during this visit. He reports he has been doing well overall without any new or severe medical concerns since our last visit. He notes his neuropathy is well controlled with medication.   He denies any new infection issues, bone pain, fever, nausea, chills, night sweats, unexpected weight loss, chest pain, muscle cramps, dental issues, abdominal pain, back pain, shortness of breath, or leg swelling.   He has been regularly taking Revlimid 10 mg as prescribed. Patient notes he has been tolerating his Revlimid well without any new or severe toxicities.   He has received all of his age related vaccines.   MEDICAL HISTORY:  Intermittent hypercalcemia - primary hyperparathyroidism status post parathyroidectomy in 2019 HTN HLD Glaucoma History of Helicobacter pylori infection previously treated Chronic leukopenia/neutropenia History of present iron deficiency anemia Acute DVT  SURGICAL HISTORY: Rt TKA 2018 Parathyroidectomy 2019 B/l cataracts sx 2021  SOCIAL HISTORY: Social History   Socioeconomic History   Marital status: Married    Spouse name: Not on file   Number of children: Not on file   Years of education: Not on file   Highest education level: Not on file  Occupational History   Occupation: retired    Comment: Manufacturing systems engineer  Tobacco Use    Smoking status: Never   Smokeless tobacco: Never  Vaping Use   Vaping Use: Never used  Substance and Sexual Activity   Alcohol use: Not Currently   Drug use: Never   Sexual activity: Not Currently    Birth control/protection: None  Other Topics Concern   Not on file  Social History Narrative   Right handed   retired   International aid/development worker of Health   Financial Resource Strain: Low Risk  (02/18/2023)   Overall Financial Resource Strain (CARDIA)    Difficulty of Paying Living Expenses: Not hard at all  Food Insecurity: No Food Insecurity (02/18/2023)   Hunger Vital Sign    Worried About Running Out of Food in the Last Year: Never true    Ran Out of Food in the Last Year: Never true  Transportation Needs: No Transportation Needs (02/18/2023)   PRAPARE - Administrator, Civil Service (Medical): No    Lack of Transportation (Non-Medical): No  Physical Activity: Inactive (02/18/2023)   Exercise Vital Sign    Days of Exercise per Week: 0 days    Minutes of Exercise per Session: 0 min  Stress: No Stress Concern Present (02/18/2023)   [employer] of Occupational Health - Occupational Stress Questionnaire    Feeling of Stress : Not at all  Social Connections: Socially Integrated (02/18/2023)   Social Connection and Isolation Panel [NHANES]    Frequency of Communication with Friends and Family: More than three times a week    Frequency of Social Gatherings with Friends and Family: Three times a week    Attends Religious Services: More than 4 times  per year    Active Member of Clubs or Organizations: Yes    Attends Banker Meetings: More than 4 times per year    Marital Status: Married  Catering manager Violence: Not At Risk (02/18/2023)   Humiliation, Afraid, Rape, and Kick questionnaire    Fear of Current or Ex-Partner: No    Emotionally Abused: No    Physically Abused: No    Sexually Abused: No  Ex smoker (quit 1976) 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  MEDICATIONS:  Current Outpatient Medications on File Prior to Visit  Medication Sig Dispense Refill   acyclovir (ZOVIRAX) 400 MG tablet Take 1 tablet by mouth twice daily 60 tablet 0   atorvastatin (LIPITOR) 20 MG tablet Take 1 tablet (20 mg total) by mouth at bedtime. 90 tablet 1   ELIQUIS 5 MG TABS tablet Take 1 tablet by mouth twice daily 60 tablet 0   ergocalciferol (VITAMIN D2) 1.25 MG (50000 UT) capsule Take 1 capsule (50,000 Units total) by mouth once a week. 30 capsule 0   erythromycin ophthalmic ointment Place 1 Application into both eyes in the morning and at bedtime. 3.5 g 1   FERREX 150 150 MG capsule Take 1 capsule by mouth once daily 90 capsule 0   gabapentin (NEURONTIN) 100 MG capsule Take 1 capsule (100 mg total) by mouth 2 (two) times daily as needed. 60 capsule 3   Krill Oil (OMEGA-3) 500 MG CAPS      latanoprost (XALATAN) 0.005 % ophthalmic solution SMARTSIG:1 Drop(s) In Eye(s) Every Evening     loratadine (CLARITIN) 10 MG tablet Take 10 mg by mouth daily.     Magnesium Oxide 400 MG CAPS      Multiple Vitamins-Minerals (MULTIVITAMIN MEN 50+) TABS Take by mouth.     olmesartan (BENICAR) 20 MG tablet Take 1 tablet (20 mg total) by mouth daily. 90 tablet 1   Olopatadine HCl 0.2 % SOLN Apply 1 drop to eye daily. 2.5 mL 1   propranolol (INDERAL) 20 MG tablet Take 1 tablet (20 mg total) by mouth 2 (two) times daily. 180 tablet 1   REVLIMID 10 MG capsule TAKE 1 CAPSULE BY MOUTH 1 TIME A DAY FOR 21 DAYS ON AND 7 DAYS OFF 21 capsule 0   No current facility-administered medications on file prior to visit.     REVIEW OF SYSTEMS:   10 Point review of Systems was done is negative except as noted above.  PHYSICAL EXAMINATION: .BP (!) 171/77 (BP Location: Left Arm, Patient Position: Sitting) Comment: Nurse  notified . BP 2: 169/73  Pulse 60 Comment: dropped to 40. pt. says this has happened before but not anymore.  Temp 98.1 F (36.7 C) (Temporal)   Resp 16   Wt 218 lb 3.2 oz (99 kg)   SpO2 100%   BMI 28.79 kg/m   NAD GENERAL:alert, in no acute distress and comfortable SKIN: no acute rashes, no significant lesions EYES: conjunctiva are pink and non-injected, sclera anicteric OROPHARYNX: MMM, no exudates, no oropharyngeal erythema or ulceration NECK: supple, no JVD LYMPH:  no palpable lymphadenopathy in the cervical, axillary or inguinal regions LUNGS: clear to auscultation b/l with normal respiratory effort HEART: regular rate & rhythm ABDOMEN:  normoactive bowel sounds , non tender, not distended. Extremity: no pedal edema PSYCH: alert & oriented  x 3 with fluent speech NEURO: no focal motor/sensory deficits   LABORATORY DATA:  I have reviewed the data as listed  .    Latest Ref Rng & Units 03/31/2023   11:48 AM 02/09/2023   12:24 PM 12/08/2022    1:21 PM  CBC  WBC 4.0 - 10.5 K/uL 2.3  3.4  2.4   Hemoglobin 13.0 - 17.0 g/dL 16.1  09.6  04.5   Hematocrit 39.0 - 52.0 % 41.1  41.9  40.8   Platelets 150 - 400 K/uL 125  217  131    CBC    Component Value Date/Time   WBC 2.3 (L) 03/31/2023 1148   WBC 5.1 08/11/2022 0807   RBC 4.88 03/31/2023 1148   HGB 13.6 03/31/2023 1148   HCT 41.1 03/31/2023 1148   PLT 125 (L) 03/31/2023 1148   MCV 84.2 03/31/2023 1148   MCH 27.9 03/31/2023 1148   MCHC 33.1 03/31/2023 1148   RDW 15.2 03/31/2023 1148   LYMPHSABS 0.7 03/31/2023 1148   MONOABS 0.5 03/31/2023 1148   EOSABS 0.1 03/31/2023 1148   BASOSABS 0.0 03/31/2023 1148    .    Latest Ref Rng & Units 03/31/2023   11:48 AM 02/09/2023   12:24 PM 12/08/2022    1:21 PM  CMP  Glucose 70 - 99 mg/dL 78  409  82   BUN 8 - 23 mg/dL 13  15  15    Creatinine 0.61 - 1.24 mg/dL 8.11  9.14  7.82   Sodium 135 - 145 mmol/L 140  140  138   Potassium 3.5 - 5.1 mmol/L 4.0  4.8  4.2   Chloride 98 -  111 mmol/L 108  108  106   CO2 22 - 32 mmol/L 27  30  30    Calcium 8.9 - 10.3 mg/dL 9.7  95.6  21.3   Total Protein 6.5 - 8.1 g/dL 6.0  6.3  6.4   Total Bilirubin 0.3 - 1.2 mg/dL 1.2  0.5  0.8   Alkaline Phos 38 - 126 U/L 23  30  27    AST 15 - 41 U/L 27  22  25    ALT 0 - 44 U/L 28  21  26       IEOP/Immunofixation Order: [id number] Component 9 d ago  PATHOLOGIST CHARGE ID  [id number] - Ambrose Finland, M.D.   SERUM IEOP COMMENT   NO DEFINITE CLONALITY DETECTED.   Reviewed and Interpreted By   Ambrose Finland, M.D.       RADIOGRAPHIC STUDIES:   [place] lower extremity venous ([id number]) done 08/19/2022 revealed "Summary:  RIGHT:  - Findings consistent with acute deep vein thrombosis involving the right  proximal, mid, and distal femoral vein, right popliteal vein, and right  peroneal veins.  - No cystic structure found in the popliteal fossa.     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 Most recent PET restaging scan done 08/13/2022 showed no overt signs of disease progression. Myeloma FISH panel no reported mutations. Cytogenetics-loss of Y chromosome.  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 Possibly present even prior to this and could represent benign  ethnic neutropenia. Recent labs have shown resolution of his leukopenia/neutropenia.  We will continue to monitor on Revlimid.  3) h/o DVT of the right lower extremity Ultrasound right lower extremity 08/19/2022- Findings consistent with acute deep vein thrombosis involving the right   proximal, mid, and distal femoral vein, right popliteal vein, and right  peroneal veins.  Resolved and continues to be on anticoagulation.  4) history of stye -continue f/u with ophthamology Currently not an acute issue and has resolved since the patient has been off Velcade.  PLAN: -Discussed lab results from 03/31/2023 with the patient. CBC shows slightly decreased WBC at 2.3 K and slightly decreased platelets at 125 K. CMP shows slightly decreased alkaline Phosphate levels at 23.  -Dicussed Multiple myeloma panel results from 03/31/2023, which did not show M-protein.  -Patient has been tolerating his Revlimid dosage well without any new or severe toxicities.   -Patient denies any toxicities with Revlimid.  -Answered all of patient's questions.  -Continue Revlimid maintenance at 10 mg p.o. 3 weeks on 1 week off. -Avoiding dehydration and drinking at least 2 L of water daily. Continue Zometa every 8 weeks  -Continue Eliquis 5 mg p.o. twice daily.while on Revlimid.  -Patient can discontinue acyclovir since it has been>6 months since use of Velcade.  FOLLOW-UP: Labs in 8 weeks RTC with Dr Candise Che and for next Zometa infusion in 9 weeks  The total time spent in the appointment was 30 minutes* .  All of the patient's questions were answered with apparent satisfaction. The patient knows to call the clinic with any problems, questions or concerns.   Wyvonnia Lora MD MS AAHIVMS [facility] Hematology/Oncology Physician [facility]  .*Total Encounter Time as defined by the Centers for Medicare and Medicaid Services includes, in addition to the face-to-face time of a patient visit (documented in the note above) non-face-to-face time: obtaining and reviewing outside history, ordering and reviewing medications, tests or procedures, care coordination (communications with other health care professionals or caregivers) and documentation in the medical record.   I, Ok Edwards, am acting  as a Neurosurgeon for Wyvonnia Lora, MD. .I have reviewed the above documentation for accuracy and completeness, and I agree with the above. [name] MD

## 2023-04-12 ENCOUNTER — Encounter: Payer: Self-pay | Admitting: Hematology

## 2023-04-14 ENCOUNTER — Telehealth: Payer: Self-pay | Admitting: Hematology

## 2023-04-16 ENCOUNTER — Ambulatory Visit (INDEPENDENT_AMBULATORY_CARE_PROVIDER_SITE_OTHER)
Admission: RE | Admit: 2023-04-16 | Discharge: 2023-04-16 | Disposition: A | Discharge status: Home / Self Care | Payer: Medicare Other | Source: Ambulatory Visit | Attending: Family Medicine | Admitting: Family Medicine

## 2023-04-16 ENCOUNTER — Ambulatory Visit (INDEPENDENT_AMBULATORY_CARE_PROVIDER_SITE_OTHER): Payer: Medicare Other | Admitting: Family Medicine

## 2023-04-16 VITALS — BP 128/60 | HR 56 | Temp 98.2°F | Ht 73.0 in | Wt 217.4 lb

## 2023-04-16 DIAGNOSIS — M25432 Effusion, left wrist: Secondary | ICD-10-CM

## 2023-04-16 DIAGNOSIS — M25532 Pain in left wrist: Secondary | ICD-10-CM

## 2023-04-16 MED ORDER — DICLOFENAC SODIUM 1 % EX CREA
2.0000 g | TOPICAL_CREAM | Freq: Four times a day (QID) | CUTANEOUS | 0 refills | Status: DC | PRN
Start: 2023-04-16 — End: 2023-05-13

## 2023-04-16 NOTE — Patient Instructions (Signed)
Please have x-ray performed today.  If any concerns on the imaging I will let you know.  I am checking a gout test.  Try the anti-inflammatory cream 4 times per day to your wrist and give me an update by Monday.  If any worsening symptoms over the weekend be seen through urgent care or ER but I do not expect that to happen.  Hope you feel better soon.

## 2023-04-16 NOTE — Progress Notes (Signed)
Subjective:  Patient ID: Nathan Martinez, male    DOB: Oct 20, 1946  Age: 77 y.o. MRN: [id number]  CC:  Chief Complaint  Patient presents with   Joint Swelling    Pt notes LT wrist swelling starting over the weekend, notes some movements are painful but the swelling and palpating does not hurt     HPI Nathan Martinez presents for  Left wrist swelling:  Initially started with pain about 4-5 days ago. Drove to [place] last weekend (grandson graduated HS with honors). Pain noticed after drive. No fall/injury or new activities other than lifting suitcases for the trip. No pain with that movement. Peak soreness/swelling yesterday, about the same today or less. Pain with certain movements.  Tx: topical muscle rub, ice. No oral pain meds. Min relief.  No fever.   History of multiple myeloma, hyperlipidemia, hypertension, no known history of gout.  He is not on thiazide diuretic.  Treated with Revlimid for history of multiple myeloma with recent office visit May 14 at hematology.  Continues on Zometa every 8 weeks - last infusion about 10 days ago - no prior joint pain.  Eliquis twice daily while on Revlimid. Similar sx's in past when lived in [place] - cyst vs. arthritis, last flare few years ago.    History of CKD, peptic ulcer disease or cardiovascular disease.  No recent NSAIDs.  PET scan 08/13/2022, no hypermetabolic skeletal lesion on whole-body PET scan to localize active multiple myeloma, previous activity in L2 vertebral body and left eighth rib had resolved.  History Patient Active Problem List   Diagnosis Date Noted   Multiple myeloma not having achieved remission (HCC) 04/16/2022   Multiple myeloma (HCC) 01/24/2022   Counseling regarding advance care planning and goals of care 01/24/2022   Past Medical History:  Diagnosis Date   DVT (deep vein thrombosis) in pregnancy    Hyperlipidemia    Hypertension    Multiple myeloma (HCC)    Peripheral neuropathy    Past Surgical  History:  Procedure Laterality Date   CATARACT EXTRACTION, BILATERAL     KNEE SURGERY Right 2018   PARATHYROID EXPLORATION     No Known Allergies Prior to Admission medications   Medication Sig Start Date End Date Taking? Authorizing Provider  atorvastatin (LIPITOR) 20 MG tablet Take 1 tablet (20 mg total) by mouth at bedtime. 11/10/22  Yes Shade Flood, MD  ELIQUIS 5 MG TABS tablet Take 1 tablet by mouth twice daily 04/02/23  Yes [name], MD  ergocalciferol (VITAMIN D2) 1.25 MG (50000 UT) capsule Take 1 capsule (50,000 Units total) by mouth once a week. 11/10/22  Yes Shade Flood, MD  erythromycin ophthalmic ointment Place 1 Application into both eyes in the morning and at bedtime. 05/23/22  Yes [name], MD  FERREX 150 150 MG capsule Take 1 capsule by mouth once daily 01/26/23  Yes Ribakove, Cindie Crumbly, MD  gabapentin (NEURONTIN) 100 MG capsule Take 1 capsule (100 mg total) by mouth 2 (two) times daily as needed. 02/24/23  Yes Shade Flood, MD  Krill Oil (OMEGA-3) 500 MG CAPS  09/30/16  Yes [provider]  latanoprost (XALATAN) 0.005 % ophthalmic solution SMARTSIG:1 Drop(s) In Eye(s) Every Evening 01/28/22  Yes [provider]  loratadine (CLARITIN) 10 MG tablet Take 10 mg by mouth daily.   Yes [provider]  Magnesium Oxide 400 MG CAPS    Yes [provider]  Multiple Vitamins-Minerals (MULTIVITAMIN MEN 50+)  TABS Take by mouth.   Yes [provider]  olmesartan (BENICAR) 20 MG tablet Take 1 tablet (20 mg total) by mouth daily. 11/10/22  Yes Shade Flood, MD  Olopatadine HCl 0.2 % SOLN Apply 1 drop to eye daily. 05/23/22  Yes [name], MD  propranolol (INDERAL) 20 MG tablet Take 1 tablet (20 mg total) by mouth 2 (two) times daily. 12/23/22  Yes Tat, Octaviano Batty, DO  REVLIMID 10 MG capsule TAKE 1 CAPSULE BY MOUTH 1 TIME A DAY FOR 21 DAYS ON AND 7 DAYS OFF 03/25/23  Yes [name], MD   Social  History   Socioeconomic History   Marital status: Married    Spouse name: Not on file   Number of children: Not on file   Years of education: Not on file   Highest education level: Bachelor's degree (e.g., BA, AB, BS)  Occupational History   Occupation: retired    Comment: Manufacturing systems engineer  Tobacco Use   Smoking status: Never   Smokeless tobacco: Never  Vaping Use   Vaping Use: Never used  Substance and Sexual Activity   Alcohol use: Not Currently   Drug use: Never   Sexual activity: Not Currently    Birth control/protection: None  Other Topics Concern   Not on file  Social History Narrative   Right handed   retired   International aid/development worker of Corporate investment banker Strain: Low Risk  (04/16/2023)   Overall Financial Resource Strain (CARDIA)    Difficulty of Paying Living Expenses: Not hard at all  Food Insecurity: No Food Insecurity (04/16/2023)   Hunger Vital Sign    Worried About Running Out of Food in the Last Year: Never true    Ran Out of Food in the Last Year: Never true  Transportation Needs: No Transportation Needs (04/16/2023)   PRAPARE - Administrator, Civil Service (Medical): No    Lack of Transportation (Non-Medical): No  Physical Activity: Insufficiently Active (04/16/2023)   Exercise Vital Sign    Days of Exercise per Week: 1 day    Minutes of Exercise per Session: 70 min  Stress: No Stress Concern Present (04/16/2023)   [employer] of Occupational Health - Occupational Stress Questionnaire    Feeling of Stress : Not at all  Social Connections: Socially Integrated (04/16/2023)   Social Connection and Isolation Panel [NHANES]    Frequency of Communication with Friends and Family: More than three times a week    Frequency of Social Gatherings with Friends and Family: Once a week    Attends Religious Services: More than 4 times per year    Active Member of [employer] or Organizations: Yes    Attends Banker Meetings: More than 4  times per year    Marital Status: Married  Catering manager Violence: Not At Risk (02/18/2023)   Humiliation, Afraid, Rape, and Kick questionnaire    Fear of Current or Ex-Partner: No    Emotionally Abused: No    Physically Abused: No    Sexually Abused: No    Review of Systems Per HPI.   Objective:   Vitals:   04/16/23 1355  BP: 128/60  Pulse: (!) 56  Temp: 98.2 F (36.8 C)  TempSrc: Temporal  SpO2: 98%  Weight: 217 lb 6.4 oz (98.6 kg)  Height: 6\' 1"  (1.854 m)     Physical Exam Constitutional:      General: He is not in acute distress.  Appearance: Normal appearance. He is well-developed.  HENT:     Head: Normocephalic and atraumatic.  Cardiovascular:     Rate and Rhythm: Normal rate.  Pulmonary:     Effort: Pulmonary effort is normal.  Musculoskeletal:     Comments: Left wrist, swelling noted proximal wrist, dorsal aspect.  Skin intact without appreciable erythema but slight warmth.  Diffuse tenderness over the proximal carpal row, dorsal wrist towards scaphoid.  Diffuse discomfort.  Slight decreased extension, radial deviation with discomfort.  Neurovascular intact distally.  Skin:    General: Skin is warm and dry.     Findings: No erythema or rash.  Neurological:     Mental Status: He is alert and oriented to person, place, and time.  Psychiatric:        Mood and Affect: Mood normal.      Assessment & Plan:  Nathan Martinez is a 77 y.o. male . Wrist pain, left - Plan: DG Wrist Complete Left, Uric acid, Diclofenac Sodium 1 % CREA  Swelling of joint, wrist, left - Plan: DG Wrist Complete Left, Uric acid, Diclofenac Sodium 1 % CREA  Acute onset pain, swelling left wrist, no known history of gout, but suspicious for inflammatory monoarthropathy.  Possible overuse, strain with lifting but no known specific injury.  Possible synovitis, or flare of previous arthritis.  Check imaging, check uric acid, start diclofenac topical, but consider prednisone if not  improving in the next few days.  RTC precautions.  Update on symptoms next few days.  Meds ordered this encounter  Medications   Diclofenac Sodium 1 % CREA    Sig: Apply 2 g topically 4 (four) times daily as needed.    Dispense:  120 g    Refill:  0   Patient Instructions  Please have x-ray performed today.  If any concerns on the imaging I will let you know.  I am checking a gout test.  Try the anti-inflammatory cream 4 times per day to your wrist and give me an update by Monday.  If any worsening symptoms over the weekend be seen through urgent care or ER but I do not expect that to happen.  Hope you feel better soon.    Signed,   Meredith Staggers, MD [facility], [facility] 04/16/23 5:31 PM

## 2023-04-18 ENCOUNTER — Other Ambulatory Visit: Payer: Self-pay | Admitting: Oncology

## 2023-04-18 DIAGNOSIS — D472 Monoclonal gammopathy: Secondary | ICD-10-CM

## 2023-04-21 ENCOUNTER — Encounter: Payer: Self-pay | Admitting: Hematology

## 2023-04-21 ENCOUNTER — Other Ambulatory Visit: Payer: Self-pay | Admitting: Hematology

## 2023-04-21 DIAGNOSIS — C9000 Multiple myeloma not having achieved remission: Secondary | ICD-10-CM

## 2023-05-03 ENCOUNTER — Other Ambulatory Visit: Payer: Self-pay | Admitting: Hematology

## 2023-05-04 ENCOUNTER — Encounter: Payer: Self-pay | Admitting: Hematology

## 2023-05-13 ENCOUNTER — Ambulatory Visit (INDEPENDENT_AMBULATORY_CARE_PROVIDER_SITE_OTHER): Payer: Medicare Other | Admitting: Family Medicine

## 2023-05-13 ENCOUNTER — Encounter: Payer: Self-pay | Admitting: Family Medicine

## 2023-05-13 VITALS — BP 132/74 | HR 50 | Temp 98.2°F | Ht 73.0 in | Wt 215.6 lb

## 2023-05-13 DIAGNOSIS — G620 Drug-induced polyneuropathy: Secondary | ICD-10-CM | POA: Diagnosis not present

## 2023-05-13 DIAGNOSIS — I10 Essential (primary) hypertension: Secondary | ICD-10-CM | POA: Diagnosis not present

## 2023-05-13 DIAGNOSIS — E785 Hyperlipidemia, unspecified: Secondary | ICD-10-CM

## 2023-05-13 DIAGNOSIS — R2681 Unsteadiness on feet: Secondary | ICD-10-CM | POA: Diagnosis not present

## 2023-05-13 DIAGNOSIS — E559 Vitamin D deficiency, unspecified: Secondary | ICD-10-CM | POA: Diagnosis not present

## 2023-05-13 LAB — LIPID PANEL
Cholesterol: 140 mg/dL (ref 0–200)
HDL: 51.2 mg/dL (ref >39.00)
LDL Cholesterol: 73 mg/dL (ref 0–99)
NonHDL: 88.82
Total CHOL/HDL Ratio: 3
Triglycerides: 80 mg/dL (ref 0.0–149.0)
VLDL: 16 mg/dL (ref 0.0–40.0)

## 2023-05-13 LAB — VITAMIN D 25 HYDROXY (VIT D DEFICIENCY, FRACTURES): VITD: 33.05 ng/mL (ref 30.00–100.00)

## 2023-05-13 LAB — VITAMIN B12: Vitamin B-12: 399 pg/mL (ref 211–911)

## 2023-05-13 NOTE — Patient Instructions (Addendum)
No medication changes at this time.  If you do have more burning or pain in the feet you can increase the dose of gabapentin but I think we should remain on same dose for now.  See information below on fall prevention in the home.  I will refer you to neurology to discuss the unsteadiness but I think that is probably related to the neuropathy.  I am checking a B12 level as well today with your blood work.  Let me know if you have any questions.Return to the clinic or go to the nearest emergency room if any of your symptoms worsen or new symptoms occur.  Fall Prevention in the Home, Adult Falls can cause injuries and affect people of all ages. There are many simple things that you can do to make your home safe and to help prevent falls. If you need it, ask for help making these changes. What actions can I take to prevent falls? General information Use good lighting in all rooms. Make sure to: Replace any light bulbs that burn out. Turn on lights if it is dark and use night-lights. Keep items that you use often in easy-to-reach places. Lower the shelves around your home if needed. Move furniture so that there are clear paths around it. Do not keep throw rugs or other things on the floor that can make you trip. If any of your floors are uneven, fix them. Add color or contrast paint or tape to clearly mark and help you see: Grab bars or handrails. First and last steps of staircases. Where the edge of each step is. If you use a ladder or stepladder: Make sure that it is fully opened. Do not climb a closed ladder. Make sure the sides of the ladder are locked in place. Have someone hold the ladder while you use it. Know where your pets are as you move through your home. What can I do in the bathroom?     Keep the floor dry. Clean up any water that is on the floor right away. Remove soap buildup in the bathtub or shower. Buildup makes bathtubs and showers slippery. Use non-skid mats or decals on  the floor of the bathtub or shower. Attach bath mats securely with double-sided, non-slip rug tape. If you need to sit down while you are in the shower, use a non-slip stool. Install grab bars by the toilet and in the bathtub and shower. Do not use towel bars as grab bars. What can I do in the bedroom? Make sure that you have a light by your bed that is easy to reach. Do not use any sheets or blankets on your bed that hang to the floor. Have a firm bench or chair with side arms that you can use for support when you get dressed. What can I do in the kitchen? Clean up any spills right away. If you need to reach something above you, use a sturdy step stool that has a grab bar. Keep electrical cables out of the way. Do not use floor polish or wax that makes floors slippery. What can I do with my stairs? Do not leave anything on the stairs. Make sure that you have a light switch at the top and the bottom of the stairs. Have them installed if you do not have them. Make sure that there are handrails on both sides of the stairs. Fix handrails that are broken or loose. Make sure that handrails are as long as the staircases. Install  non-slip stair treads on all stairs in your home if they do not have carpet. Avoid having throw rugs at the top or bottom of stairs, or secure the rugs with carpet tape to prevent them from moving. Choose a carpet design that does not hide the edge of steps on the stairs. Make sure that carpet is firmly attached to the stairs. Fix any carpet that is loose or worn. What can I do on the outside of my home? Use bright outdoor lighting. Repair the edges of walkways and driveways and fix any cracks. Clear paths of anything that can make you trip, such as tools or rocks. Add color or contrast paint or tape to clearly mark and help you see high doorway thresholds. Trim any bushes or trees on the main path into your home. Check that handrails are securely fastened and in good  repair. Both sides of all steps should have handrails. Install guardrails along the edges of any raised decks or porches. Have leaves, snow, and ice cleared regularly. Use sand, salt, or ice melt on walkways during winter months if you live where there is ice and snow. In the garage, clean up any spills right away, including grease or oil spills. What other actions can I take? Review your medicines with your health care provider. Some medicines can make you confused or feel dizzy. This can increase your chance of falling. Wear closed-toe shoes that fit well and support your feet. Wear shoes that have rubber soles and low heels. Use a cane, walker, scooter, or crutches that help you move around if needed. Talk with your provider about other ways that you can decrease your risk of falls. This may include seeing a physical therapist to learn to do exercises to improve movement and strength. Where to find more information Centers for Disease Control and Prevention, STEADI: TonerPromos.no [employer] on Aging: BaseRingTones.pl National Institute on Aging: BaseRingTones.pl Contact a health care provider if: You are afraid of falling at home. You feel weak, drowsy, or dizzy at home. You fall at home. Get help right away if you: Lose consciousness or have trouble moving after a fall. Have a fall that causes a head injury. These symptoms may be an emergency. Get help right away. Call 911. Do not wait to see if the symptoms will go away. Do not drive yourself to the hospital. This information is not intended to replace advice given to you by your health care provider. Make sure you discuss any questions you have with your health care provider. Document Revised: 07/14/2022 Document Reviewed: 07/14/2022 Elsevier Patient Education  2024 Elsevier Inc.   Peripheral Neuropathy Peripheral neuropathy is a type of nerve damage. It affects nerves that carry signals between the spinal cord and the arms, legs, and the  rest of the body (peripheral nerves). It does not affect nerves in the spinal cord or brain. In peripheral neuropathy, one nerve or a group of nerves may be damaged. Peripheral neuropathy is a broad category that includes many specific nerve disorders, like diabetic neuropathy, hereditary neuropathy, and carpal tunnel syndrome. What are the causes? This condition may be caused by: Certain diseases, such as: Diabetes. This is the most common cause of peripheral neuropathy. Autoimmune diseases, such as rheumatoid arthritis and systemic lupus erythematosus. Nerve diseases that are passed from parent to child (inherited). Kidney disease. Thyroid disease. Other causes may include: Nerve injury. Pressure or stress on a nerve that lasts a long time. Lack (deficiency) of B vitamins. This can  result from alcoholism, poor diet, or a restricted diet. Infections. Some medicines, such as cancer medicines (chemotherapy). Poisonous (toxic) substances, such as lead and mercury. Too little blood flowing to the legs. In some cases, the cause of this condition is not known. What are the signs or symptoms? Symptoms of this condition depend on which of your nerves is damaged. Symptoms in the legs, hands, and arms can include: Loss of feeling (numbness) in the feet, hands, or both. Tingling in the feet, hands, or both. Burning pain. Very sensitive skin. Weakness. Not being able to move a part of the body (paralysis). Clumsiness or poor coordination. Muscle twitching. Loss of balance. Symptoms in other parts of the body can include: Not being able to control your bladder. Feeling dizzy. Sexual problems. How is this diagnosed? Diagnosing and finding the cause of peripheral neuropathy can be difficult. Your health care provider will take your medical history and do a physical exam. A neurological exam will also be done. This involves checking things that are affected by your brain, spinal cord, and nerves  (nervous system). For example, your health care provider will check your reflexes, how you move, and what you can feel. You may have other tests, such as: Blood tests. Electromyogram (EMG) and nerve conduction tests. These tests check nerve function and how well the nerves are controlling the muscles. Imaging tests, such as a CT scan or MRI, to rule out other causes of your symptoms. Removing a small piece of nerve to be examined in a lab (nerve biopsy). Removing and examining a small amount of the fluid that surrounds the brain and spinal cord (lumbar puncture). How is this treated? Treatment for this condition may involve: Treating the underlying cause of the neuropathy, such as diabetes, kidney disease, or vitamin deficiencies. Stopping medicines that can cause neuropathy, such as chemotherapy. Medicine to help relieve pain. Medicines may include: Prescription or over-the-counter pain medicine. Anti-seizure medicine. Antidepressants. Pain-relieving patches that are applied to painful areas of skin. Surgery to relieve pressure on a nerve or to destroy a nerve that is causing pain. Physical therapy to help improve movement and balance. Devices to help you move around (assistive devices). Follow these instructions at home: Medicines Take over-the-counter and prescription medicines only as told by your health care provider. Do not take any other medicines without first asking your health care provider. Ask your health care provider if the medicine prescribed to you requires you to avoid driving or using machinery. Lifestyle  Do not use any products that contain nicotine or tobacco. These products include cigarettes, chewing tobacco, and vaping devices, such as e-cigarettes. Smoking keeps blood from reaching damaged nerves. If you need help quitting, ask your health care provider. Avoid or limit alcohol. Too much alcohol can cause a vitamin B deficiency, and vitamin B is needed for healthy  nerves. Eat a healthy diet. This includes: Eating foods that are high in fiber, such as beans, whole grains, and fresh fruits and vegetables. Limiting foods that are high in fat and processed sugars, such as fried or sweet foods. General instructions  If you have diabetes, work closely with your health care provider to keep your blood sugar under control. If you have numbness in your feet: Check every day for signs of injury or infection. Watch for redness, warmth, and swelling. Wear padded socks and comfortable shoes. These help protect your feet. Develop a good support system. Living with peripheral neuropathy can be stressful. Consider talking with a mental health specialist  or joining a support group. Use assistive devices and attend physical therapy as told by your health care provider. This may include using a walker or a cane. Keep all follow-up visits. This is important. Where to find more information [employer] of Neurological Disorders: ToledoAutomobile.co.uk Contact a health care provider if: You have new signs or symptoms of peripheral neuropathy. You are struggling emotionally from dealing with peripheral neuropathy. Your pain is not well controlled. Get help right away if: You have an injury or infection that is not healing normally. You develop new weakness in an arm or leg. You have fallen or do so frequently. Summary Peripheral neuropathy is when the nerves in the arms or legs are damaged, resulting in numbness, weakness, or pain. There are many causes of peripheral neuropathy, including diabetes, pinched nerves, vitamin deficiencies, autoimmune disease, and hereditary conditions. Diagnosing and finding the cause of peripheral neuropathy can be difficult. Your health care provider will take your medical history, do a physical exam, and do tests, including blood tests and nerve function tests. Treatment involves treating the underlying cause of the neuropathy and taking  medicines to help control pain. Physical therapy and assistive devices may also help. This information is not intended to replace advice given to you by your health care provider. Make sure you discuss any questions you have with your health care provider. Document Revised: 07/16/2021 Document Reviewed: 07/16/2021 Elsevier Patient Education  2024 [employer].

## 2023-05-13 NOTE — Progress Notes (Signed)
Subjective:  Patient ID: Nathan Martinez, male    DOB: Apr 22, 1946  Age: 77 y.o. MRN: [id number]  CC:  Chief Complaint  Patient presents with   Medical Management of Chronic Issues   Peripheral Neuropathy    Pt would like to discuss this issue, notes has had some increase in numbness    Colon Cancer Screening    Pt wants to know if he should screen again     HPI Nathan Martinez presents for follow up. Doing well. Getting stronger every day.   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  Tingling, burning sensation discussed in October.  Started on gabapentin that time with option of escalating doses. Currently taking 100mg  BID gabapentin. No side effects if taking with food. Pain is better, still some. Feels slightly unsteady in legs at times. Some trouble feeling where is stepping. No syncope/near-syncope or lightheadedness.  No falls.  No focal weakness. Symptoms for months. No limitations in activity.  No results found for: "VITAMINB12"   Hypertension: Benicar 20 mg daily.  Propranolol 20 mg twice daily.no new med side effects.  Home readings:130-140/70-80.  BP Readings from Last 3 Encounters:  05/13/23 132/74  04/16/23 128/60  04/07/23 (!) 171/77   Lab Results  Component Value Date   CREATININE 1.12 03/31/2023   Hyperlipidemia: Lipitor 20 mg daily, no new myalgias or side effects.  Lab Results  Component Value Date   CHOL 147 11/10/2022   HDL 55.60 11/10/2022   LDLCALC 79 11/10/2022   TRIG 59.0 11/10/2022   CHOLHDL 3 11/10/2022   Lab Results  Component Value Date   ALT 28 03/31/2023   AST 27 03/31/2023   ALKPHOS 23 (L) 03/31/2023   BILITOT 1.2 03/31/2023    Vitamin D deficiency: 50,000 units supplement every 2 weeks.  Last vitamin D Lab Results  Component Value Date   VD25OH 33.40 11/10/2022   Hematology: History of multiple myeloma, prior DVT, followed by Dr. Candise Che.   Last office visit May 14.  Continued on Eliquis for anticoagulation.Revlimid for multiple myeloma and Zometa every 8 weeks.  Off acyclovir as greater than 6 months since use of Velcade.  No new CP/ leg swelling/dyspnea. No new bleeding with use of Eliquis.    History Patient Active Problem List   Diagnosis Date Noted   Multiple myeloma not having achieved remission (HCC) 04/16/2022   Multiple myeloma (HCC) 01/24/2022   Counseling regarding advance care planning and goals of care 01/24/2022   Past Medical History:  Diagnosis Date   DVT (deep vein thrombosis) in pregnancy    Hyperlipidemia    Hypertension    Multiple myeloma (HCC)    Peripheral neuropathy    Past Surgical History:  Procedure Laterality Date   CATARACT EXTRACTION, BILATERAL     KNEE SURGERY Right 2018   PARATHYROID EXPLORATION     No Known Allergies Prior to Admission medications   Medication Sig Start Date End Date Taking? Authorizing Provider  atorvastatin (LIPITOR) 20 MG tablet Take 1 tablet (20 mg total) by mouth at bedtime. 11/10/22  Yes Shade Flood, MD  ELIQUIS 5 MG TABS tablet Take 1 tablet by mouth twice daily 05/04/23  Yes [name], MD  ergocalciferol (VITAMIN D2) 1.25 MG (50000 UT) capsule Take 1 capsule (50,000 Units total) by mouth once a week. 11/10/22  Yes Shade Flood, MD  erythromycin ophthalmic ointment Place 1 Application into both eyes in the morning  and at bedtime. 05/23/22  Yes [name], MD  FERREX 150 150 MG capsule Take 1 capsule by mouth once daily 04/21/23  Yes Ribakove, Cindie Crumbly, MD  gabapentin (NEURONTIN) 100 MG capsule Take 1 capsule (100 mg total) by mouth 2 (two) times daily as needed. 02/24/23  Yes Shade Flood, MD  Krill Oil (OMEGA-3) 500 MG CAPS  09/30/16  Yes [provider]  latanoprost (XALATAN) 0.005 % ophthalmic solution SMARTSIG:1 Drop(s) In Eye(s) Every Evening 01/28/22  Yes [provider]  loratadine (CLARITIN) 10 MG tablet  Take 10 mg by mouth daily.   Yes [provider]  Magnesium Oxide 400 MG CAPS    Yes [provider]  Multiple Vitamins-Minerals (MULTIVITAMIN MEN 50+) TABS Take by mouth.   Yes [provider]  olmesartan (BENICAR) 20 MG tablet Take 1 tablet (20 mg total) by mouth daily. 11/10/22  Yes Shade Flood, MD  Olopatadine HCl 0.2 % SOLN Apply 1 drop to eye daily. 05/23/22  Yes [name], MD  propranolol (INDERAL) 20 MG tablet Take 1 tablet (20 mg total) by mouth 2 (two) times daily. 12/23/22  Yes Tat, Rebecca S, DO  REVLIMID 10 MG capsule TAKE 1 CAPSULE BY MOUTH 1 TIME A DAY FOR 21 DAYS ON AND 7 DAYS OFF 04/21/23  Yes [name], MD  Diclofenac Sodium 1 % CREA Apply 2 g topically 4 (four) times daily as needed. 04/16/23   Shade Flood, MD   Social History   Socioeconomic History   Marital status: Married    Spouse name: Not on file   Number of children: Not on file   Years of education: Not on file   Highest education level: Bachelor's degree (e.g., BA, AB, BS)  Occupational History   Occupation: retired    Comment: Manufacturing systems engineer  Tobacco Use   Smoking status: Never   Smokeless tobacco: Never  Vaping Use   Vaping Use: Never used  Substance and Sexual Activity   Alcohol use: Not Currently   Drug use: Never   Sexual activity: Not Currently    Birth control/protection: None  Other Topics Concern   Not on file  Social History Narrative   Right handed   retired   International aid/development worker of Corporate investment banker Strain: Low Risk  (04/16/2023)   Overall Financial Resource Strain (CARDIA)    Difficulty of Paying Living Expenses: Not hard at all  Food Insecurity: No Food Insecurity (04/16/2023)   Hunger Vital Sign    Worried About Running Out of Food in the Last Year: Never true    Ran Out of Food in the Last Year: Never true  Transportation Needs: No Transportation Needs (04/16/2023)   PRAPARE - Scientist, research (physical sciences) (Medical): No    Lack of Transportation (Non-Medical): No  Physical Activity: Insufficiently Active (04/16/2023)   Exercise Vital Sign    Days of Exercise per Week: 1 day    Minutes of Exercise per Session: 70 min  Stress: No Stress Concern Present (04/16/2023)   [employer] of Occupational Health - Occupational Stress Questionnaire    Feeling of Stress : Not at all  Social Connections: Socially Integrated (04/16/2023)   Social Connection and Isolation Panel [NHANES]    Frequency of Communication with Friends and Family: More than three times a week    Frequency of Social Gatherings with Friends and Family: Once a week    Attends Religious Services: More  than 4 times per year    Active Member of Clubs or Organizations: Yes    Attends Banker Meetings: More than 4 times per year    Marital Status: Married  Catering manager Violence: Not At Risk (02/18/2023)   Humiliation, Afraid, Rape, and Kick questionnaire    Fear of Current or Ex-Partner: No    Emotionally Abused: No    Physically Abused: No    Sexually Abused: No    Review of Systems   Objective:   Vitals:   05/13/23 1023  BP: 132/74  Pulse: (!) 50  Temp: 98.2 F (36.8 C)  TempSrc: Temporal  SpO2: 100%  Weight: 215 lb 9.6 oz (97.8 kg)  Height: 6\' 1"  (1.854 m)     Physical Exam Vitals reviewed.  Constitutional:      Appearance: He is well-developed.  HENT:     Head: Normocephalic and atraumatic.  Neck:     Vascular: No carotid bruit or JVD.  Cardiovascular:     Rate and Rhythm: Normal rate and regular rhythm.     Heart sounds: Normal heart sounds. No murmur heard. Pulmonary:     Effort: Pulmonary effort is normal.     Breath sounds: Normal breath sounds. No rales.  Musculoskeletal:     Right lower leg: No edema.     Left lower leg: No edema.  Skin:    General: Skin is warm and dry.  Neurological:     General: No focal deficit present.     Mental Status: He is alert and  oriented to person, place, and time.     Comments: Ambulating without assistive device, possibly slight cautious gait but not antalgic, narrow gait, steady.  Psychiatric:        Mood and Affect: Mood normal.     Assessment & Plan:  Nathan Martinez is a 77 y.o. male . Drug-induced polyneuropathy (HCC) - Plan: B12, Ambulatory referral to Neurology Unsteadiness - Plan: B12, Ambulatory referral to Neurology  -History of polyneuropathy thought to be due to previous medications.  Does report some slight unsteadiness with walking at times but denies any syncope/near syncope, seems to be more of an issue with sensation and stability from the legs.  Nonfocal exam.   -Neuropathic symptoms overall well-controlled, continue gabapentin 100 mg twice daily with food.  Check B12, refer to neurology to evaluate for other possible contributors to unsteadiness and could consider PT but appears overall stable in office at this time.  RTC/ER precautions given.  Hyperlipidemia, unspecified hyperlipidemia type - Plan: Lipid panel -Tolerating current meds, continue same dose, check updated labs.  Recent CMP noted.  Essential hypertension  -Borderline but overall stable and tolerating current med regimen, continue same.  Recent labs noted as above.  Vitamin D deficiency - Plan: Vitamin D (25 hydroxy)  -Supplementation 2 times per month, check updated labs.  Adjust regimen accordingly  No orders of the defined types were placed in this encounter.  Patient Instructions  No medication changes at this time.  If you do have more burning or pain in the feet you can increase the dose of gabapentin but I think we should remain on same dose for now.  See information below on fall prevention in the home.  I will refer you to neurology to discuss the unsteadiness but I think that is probably related to the neuropathy.  I am checking a B12 level as well today with your blood work.  Let me know if you have  any questions.Return to  the clinic or go to the nearest emergency room if any of your symptoms worsen or new symptoms occur.  Fall Prevention in the Home, Adult Falls can cause injuries and affect people of all ages. There are many simple things that you can do to make your home safe and to help prevent falls. If you need it, ask for help making these changes. What actions can I take to prevent falls? General information Use good lighting in all rooms. Make sure to: Replace any light bulbs that burn out. Turn on lights if it is dark and use night-lights. Keep items that you use often in easy-to-reach places. Lower the shelves around your home if needed. Move furniture so that there are clear paths around it. Do not keep throw rugs or other things on the floor that can make you trip. If any of your floors are uneven, fix them. Add color or contrast paint or tape to clearly mark and help you see: Grab bars or handrails. First and last steps of staircases. Where the edge of each step is. If you use a ladder or stepladder: Make sure that it is fully opened. Do not climb a closed ladder. Make sure the sides of the ladder are locked in place. Have someone hold the ladder while you use it. Know where your pets are as you move through your home. What can I do in the bathroom?     Keep the floor dry. Clean up any water that is on the floor right away. Remove soap buildup in the bathtub or shower. Buildup makes bathtubs and showers slippery. Use non-skid mats or decals on the floor of the bathtub or shower. Attach bath mats securely with double-sided, non-slip rug tape. If you need to sit down while you are in the shower, use a non-slip stool. Install grab bars by the toilet and in the bathtub and shower. Do not use towel bars as grab bars. What can I do in the bedroom? Make sure that you have a light by your bed that is easy to reach. Do not use any sheets or blankets on your bed that hang to the floor. Have a  firm bench or chair with side arms that you can use for support when you get dressed. What can I do in the kitchen? Clean up any spills right away. If you need to reach something above you, use a sturdy step stool that has a grab bar. Keep electrical cables out of the way. Do not use floor polish or wax that makes floors slippery. What can I do with my stairs? Do not leave anything on the stairs. Make sure that you have a light switch at the top and the bottom of the stairs. Have them installed if you do not have them. Make sure that there are handrails on both sides of the stairs. Fix handrails that are broken or loose. Make sure that handrails are as long as the staircases. Install non-slip stair treads on all stairs in your home if they do not have carpet. Avoid having throw rugs at the top or bottom of stairs, or secure the rugs with carpet tape to prevent them from moving. Choose a carpet design that does not hide the edge of steps on the stairs. Make sure that carpet is firmly attached to the stairs. Fix any carpet that is loose or worn. What can I do on the outside of my home? Use bright outdoor lighting. Repair the  edges of walkways and driveways and fix any cracks. Clear paths of anything that can make you trip, such as tools or rocks. Add color or contrast paint or tape to clearly mark and help you see high doorway thresholds. Trim any bushes or trees on the main path into your home. Check that handrails are securely fastened and in good repair. Both sides of all steps should have handrails. Install guardrails along the edges of any raised decks or porches. Have leaves, snow, and ice cleared regularly. Use sand, salt, or ice melt on walkways during winter months if you live where there is ice and snow. In the garage, clean up any spills right away, including grease or oil spills. What other actions can I take? Review your medicines with your health care provider. Some medicines can  make you confused or feel dizzy. This can increase your chance of falling. Wear closed-toe shoes that fit well and support your feet. Wear shoes that have rubber soles and low heels. Use a cane, walker, scooter, or crutches that help you move around if needed. Talk with your provider about other ways that you can decrease your risk of falls. This may include seeing a physical therapist to learn to do exercises to improve movement and strength. Where to find more information Centers for Disease Control and Prevention, STEADI: TonerPromos.no [employer] on Aging: BaseRingTones.pl National Institute on Aging: BaseRingTones.pl Contact a health care provider if: You are afraid of falling at home. You feel weak, drowsy, or dizzy at home. You fall at home. Get help right away if you: Lose consciousness or have trouble moving after a fall. Have a fall that causes a head injury. These symptoms may be an emergency. Get help right away. Call 911. Do not wait to see if the symptoms will go away. Do not drive yourself to the hospital. This information is not intended to replace advice given to you by your health care provider. Make sure you discuss any questions you have with your health care provider. Document Revised: 07/14/2022 Document Reviewed: 07/14/2022 Elsevier Patient Education  2024 Elsevier Inc.   Peripheral Neuropathy Peripheral neuropathy is a type of nerve damage. It affects nerves that carry signals between the spinal cord and the arms, legs, and the rest of the body (peripheral nerves). It does not affect nerves in the spinal cord or brain. In peripheral neuropathy, one nerve or a group of nerves may be damaged. Peripheral neuropathy is a broad category that includes many specific nerve disorders, like diabetic neuropathy, hereditary neuropathy, and carpal tunnel syndrome. What are the causes? This condition may be caused by: Certain diseases, such as: Diabetes. This is the most common cause of  peripheral neuropathy. Autoimmune diseases, such as rheumatoid arthritis and systemic lupus erythematosus. Nerve diseases that are passed from parent to child (inherited). Kidney disease. Thyroid disease. Other causes may include: Nerve injury. Pressure or stress on a nerve that lasts a long time. Lack (deficiency) of B vitamins. This can result from alcoholism, poor diet, or a restricted diet. Infections. Some medicines, such as cancer medicines (chemotherapy). Poisonous (toxic) substances, such as lead and mercury. Too little blood flowing to the legs. In some cases, the cause of this condition is not known. What are the signs or symptoms? Symptoms of this condition depend on which of your nerves is damaged. Symptoms in the legs, hands, and arms can include: Loss of feeling (numbness) in the feet, hands, or both. Tingling in the feet, hands, or both.  Burning pain. Very sensitive skin. Weakness. Not being able to move a part of the body (paralysis). Clumsiness or poor coordination. Muscle twitching. Loss of balance. Symptoms in other parts of the body can include: Not being able to control your bladder. Feeling dizzy. Sexual problems. How is this diagnosed? Diagnosing and finding the cause of peripheral neuropathy can be difficult. Your health care provider will take your medical history and do a physical exam. A neurological exam will also be done. This involves checking things that are affected by your brain, spinal cord, and nerves (nervous system). For example, your health care provider will check your reflexes, how you move, and what you can feel. You may have other tests, such as: Blood tests. Electromyogram (EMG) and nerve conduction tests. These tests check nerve function and how well the nerves are controlling the muscles. Imaging tests, such as a CT scan or MRI, to rule out other causes of your symptoms. Removing a small piece of nerve to be examined in a lab (nerve  biopsy). Removing and examining a small amount of the fluid that surrounds the brain and spinal cord (lumbar puncture). How is this treated? Treatment for this condition may involve: Treating the underlying cause of the neuropathy, such as diabetes, kidney disease, or vitamin deficiencies. Stopping medicines that can cause neuropathy, such as chemotherapy. Medicine to help relieve pain. Medicines may include: Prescription or over-the-counter pain medicine. Anti-seizure medicine. Antidepressants. Pain-relieving patches that are applied to painful areas of skin. Surgery to relieve pressure on a nerve or to destroy a nerve that is causing pain. Physical therapy to help improve movement and balance. Devices to help you move around (assistive devices). Follow these instructions at home: Medicines Take over-the-counter and prescription medicines only as told by your health care provider. Do not take any other medicines without first asking your health care provider. Ask your health care provider if the medicine prescribed to you requires you to avoid driving or using machinery. Lifestyle  Do not use any products that contain nicotine or tobacco. These products include cigarettes, chewing tobacco, and vaping devices, such as e-cigarettes. Smoking keeps blood from reaching damaged nerves. If you need help quitting, ask your health care provider. Avoid or limit alcohol. Too much alcohol can cause a vitamin B deficiency, and vitamin B is needed for healthy nerves. Eat a healthy diet. This includes: Eating foods that are high in fiber, such as beans, whole grains, and fresh fruits and vegetables. Limiting foods that are high in fat and processed sugars, such as fried or sweet foods. General instructions  If you have diabetes, work closely with your health care provider to keep your blood sugar under control. If you have numbness in your feet: Check every day for signs of injury or infection. Watch  for redness, warmth, and swelling. Wear padded socks and comfortable shoes. These help protect your feet. Develop a good support system. Living with peripheral neuropathy can be stressful. Consider talking with a mental health specialist or joining a support group. Use assistive devices and attend physical therapy as told by your health care provider. This may include using a walker or a cane. Keep all follow-up visits. This is important. Where to find more information [employer] of Neurological Disorders: ToledoAutomobile.co.uk Contact a health care provider if: You have new signs or symptoms of peripheral neuropathy. You are struggling emotionally from dealing with peripheral neuropathy. Your pain is not well controlled. Get help right away if: You have an injury or infection  that is not healing normally. You develop new weakness in an arm or leg. You have fallen or do so frequently. Summary Peripheral neuropathy is when the nerves in the arms or legs are damaged, resulting in numbness, weakness, or pain. There are many causes of peripheral neuropathy, including diabetes, pinched nerves, vitamin deficiencies, autoimmune disease, and hereditary conditions. Diagnosing and finding the cause of peripheral neuropathy can be difficult. Your health care provider will take your medical history, do a physical exam, and do tests, including blood tests and nerve function tests. Treatment involves treating the underlying cause of the neuropathy and taking medicines to help control pain. Physical therapy and assistive devices may also help. This information is not intended to replace advice given to you by your health care provider. Make sure you discuss any questions you have with your health care provider. Document Revised: 07/16/2021 Document Reviewed: 07/16/2021 Elsevier Patient Education  2024 Elsevier Inc.     Signed,   Meredith Staggers, MD [facility], [facility] 05/13/23 11:05 AM

## 2023-05-20 ENCOUNTER — Other Ambulatory Visit: Payer: Self-pay | Admitting: Hematology

## 2023-05-20 DIAGNOSIS — C9000 Multiple myeloma not having achieved remission: Secondary | ICD-10-CM

## 2023-05-21 ENCOUNTER — Telehealth: Payer: Self-pay | Admitting: Hematology

## 2023-06-01 ENCOUNTER — Other Ambulatory Visit: Payer: Self-pay | Admitting: Physician Assistant

## 2023-06-01 DIAGNOSIS — C9001 Multiple myeloma in remission: Secondary | ICD-10-CM

## 2023-06-02 ENCOUNTER — Inpatient Hospital Stay: Payer: Medicare Other | Attending: Hematology

## 2023-06-02 ENCOUNTER — Other Ambulatory Visit: Payer: Self-pay | Admitting: Hematology

## 2023-06-02 ENCOUNTER — Other Ambulatory Visit: Payer: Self-pay

## 2023-06-02 DIAGNOSIS — C9001 Multiple myeloma in remission: Secondary | ICD-10-CM

## 2023-06-02 DIAGNOSIS — C9000 Multiple myeloma not having achieved remission: Secondary | ICD-10-CM | POA: Insufficient documentation

## 2023-06-02 LAB — CMP (CANCER CENTER ONLY)
ALT: 23 U/L (ref 0–44)
AST: 27 U/L (ref 15–41)
Albumin: 3.7 g/dL (ref 3.5–5.0)
Alkaline Phosphatase: 27 U/L — ABNORMAL LOW (ref 38–126)
Anion gap: 4 — ABNORMAL LOW (ref 5–15)
BUN: 19 mg/dL (ref 8–23)
CO2: 28 mmol/L (ref 22–32)
Calcium: 10.4 mg/dL — ABNORMAL HIGH (ref 8.9–10.3)
Chloride: 108 mmol/L (ref 98–111)
Creatinine: 1.23 mg/dL (ref 0.61–1.24)
GFR, Estimated: >60 mL/min (ref >60)
Glucose, Bld: 119 mg/dL — ABNORMAL HIGH (ref 70–99)
Potassium: 4.1 mmol/L (ref 3.5–5.1)
Sodium: 140 mmol/L (ref 135–145)
Total Bilirubin: 0.9 mg/dL (ref 0.3–1.2)
Total Protein: 6.6 g/dL (ref 6.5–8.1)

## 2023-06-02 LAB — CBC WITH DIFFERENTIAL (CANCER CENTER ONLY)
Abs Immature Granulocytes: 0 10*3/uL (ref 0.00–0.07)
Basophils Absolute: 0 10*3/uL (ref 0.0–0.1)
Basophils Relative: 1 %
Eosinophils Absolute: 0.1 10*3/uL (ref 0.0–0.5)
Eosinophils Relative: 3 %
HCT: 42 % (ref 39.0–52.0)
Hemoglobin: 13.8 g/dL (ref 13.0–17.0)
Immature Granulocytes: 0 %
Lymphocytes Relative: 38 %
Lymphs Abs: 1 10*3/uL (ref 0.7–4.0)
MCH: 27.6 pg (ref 26.0–34.0)
MCHC: 32.9 g/dL (ref 30.0–36.0)
MCV: 84 fL (ref 80.0–100.0)
Monocytes Absolute: 0.4 10*3/uL (ref 0.1–1.0)
Monocytes Relative: 15 %
Neutro Abs: 1.1 10*3/uL — ABNORMAL LOW (ref 1.7–7.7)
Neutrophils Relative %: 43 %
Platelet Count: 124 10*3/uL — ABNORMAL LOW (ref 150–400)
RBC: 5 MIL/uL (ref 4.22–5.81)
RDW: 15.3 % (ref 11.5–15.5)
WBC Count: 2.6 10*3/uL — ABNORMAL LOW (ref 4.0–10.5)
nRBC: 0 % (ref 0.0–0.2)

## 2023-06-03 LAB — KAPPA/LAMBDA LIGHT CHAINS
Kappa free light chain: 23.4 mg/L — ABNORMAL HIGH (ref 3.3–19.4)
Kappa, lambda light chain ratio: 1.53 (ref 0.26–1.65)
Lambda free light chains: 15.3 mg/L (ref 5.7–26.3)

## 2023-06-07 ENCOUNTER — Other Ambulatory Visit: Payer: Self-pay | Admitting: Neurology

## 2023-06-07 DIAGNOSIS — G250 Essential tremor: Secondary | ICD-10-CM

## 2023-06-08 LAB — MULTIPLE MYELOMA PANEL, SERUM
Albumin SerPl Elph-Mcnc: 3.6 g/dL (ref 2.9–4.4)
Albumin/Glob SerPl: 1.6 (ref 0.7–1.7)
Alpha 1: 0.2 g/dL (ref 0.0–0.4)
Alpha2 Glob SerPl Elph-Mcnc: 0.5 g/dL (ref 0.4–1.0)
B-Globulin SerPl Elph-Mcnc: 0.7 g/dL (ref 0.7–1.3)
Gamma Glob SerPl Elph-Mcnc: 1.1 g/dL (ref 0.4–1.8)
Globulin, Total: 2.4 g/dL (ref 2.2–3.9)
IgA: 121 mg/dL (ref 61–437)
IgG (Immunoglobin G), Serum: 1275 mg/dL (ref 603–1613)
IgM (Immunoglobulin M), Srm: 16 mg/dL (ref 15–143)
Total Protein ELP: 6 g/dL (ref 6.0–8.5)

## 2023-06-09 ENCOUNTER — Other Ambulatory Visit: Payer: Medicare Other

## 2023-06-10 ENCOUNTER — Inpatient Hospital Stay (HOSPITAL_BASED_OUTPATIENT_CLINIC_OR_DEPARTMENT_OTHER): Payer: Medicare Other | Admitting: Physician Assistant

## 2023-06-10 ENCOUNTER — Inpatient Hospital Stay: Payer: Medicare Other

## 2023-06-10 VITALS — BP 142/72 | HR 66 | Resp 16

## 2023-06-10 VITALS — BP 136/78 | HR 60 | Temp 98.1°F | Resp 16 | Wt 219.9 lb

## 2023-06-10 DIAGNOSIS — C9000 Multiple myeloma not having achieved remission: Secondary | ICD-10-CM | POA: Diagnosis not present

## 2023-06-10 DIAGNOSIS — C9001 Multiple myeloma in remission: Secondary | ICD-10-CM

## 2023-06-10 MED ORDER — ZOLEDRONIC ACID 4 MG/100ML IV SOLN
4.0000 mg | Freq: Once | INTRAVENOUS | Status: AC
  Administered 2023-06-10: 4 mg via INTRAVENOUS
  Filled 2023-06-10: qty 100

## 2023-06-10 MED ORDER — SODIUM CHLORIDE 0.9 % IV SOLN: Freq: Once | INTRAVENOUS | Status: AC

## 2023-06-10 NOTE — Patient Instructions (Signed)

## 2023-06-11 ENCOUNTER — Encounter: Payer: Self-pay | Admitting: Hematology

## 2023-06-11 ENCOUNTER — Telehealth: Payer: Self-pay | Admitting: Physician Assistant

## 2023-06-11 NOTE — Telephone Encounter (Signed)
Patient is aware of upcoming appointment times/dates; patient also requested that a reminder be mailed to them

## 2023-06-11 NOTE — Progress Notes (Signed)
[name]   HEMATOLOGY/ONCOLOGY CLINIC VISIT NOTE   Date of Service: 06/10/23  Patient Care Team: Shade Flood, MD as PCP - General (Family Medicine) [name], MD as Consulting Physician (Hematology)  CHIEF COMPLAINTS/PURPOSE OF CONSULTATION:  Follow-up for continued evaluation and management of multiple myeloma  HISTORY OF PRESENTING ILLNESS:  Please see previous note for details of initial presentation  INTERVAL HISTORY  Nathan Martinez is here for continued evaluation and management of multiple myeloma. He was last seen by Dr. Candise Che on 04/07/2023. He is accompanied by his wife for this visit.   He is tolerating Revlimid therapy without any new or concerning toxicities. His energy is stable and he is able to complete all his ADLs on his own. His appetite and weight are unchanged. He denies any nausea, vomiting or bowel habit changes. He denies any new bone pain. He is otherwise feeling well. He denies fevers, chills, sweats, shortness of breath,chest pain or cough. He has no other complaints. Rest of the ROS is reviewed and negative.    MEDICAL HISTORY:  Past Medical History:  Diagnosis Date   DVT (deep vein thrombosis) in pregnancy    Hyperlipidemia    Hypertension    Multiple myeloma (HCC)    Peripheral neuropathy     SURGICAL HISTORY: Past Surgical History:  Procedure Laterality Date   CATARACT EXTRACTION, BILATERAL     KNEE SURGERY Right 2018   PARATHYROID EXPLORATION       SOCIAL HISTORY: Social History   Socioeconomic History   Marital status: Married    Spouse name: Not on file   Number of children: Not on file   Years of education: Not on file   Highest education level: Bachelor's degree (e.g., BA, AB, BS)  Occupational History   Occupation: retired    Comment: Manufacturing systems engineer  Tobacco Use   Smoking status: Never   Smokeless tobacco: Never  Vaping Use   Vaping status: Never Used  Substance and Sexual Activity   Alcohol use: Not Currently    Drug use: Never   Sexual activity: Not Currently    Birth control/protection: None  Other Topics Concern   Not on file  Social History Narrative   Right handed   retired   International aid/development worker of Corporate investment banker Strain: Low Risk  (04/16/2023)   Overall Financial Resource Strain (CARDIA)    Difficulty of Paying Living Expenses: Not hard at all  Food Insecurity: No Food Insecurity (04/16/2023)   Hunger Vital Sign    Worried About Running Out of Food in the Last Year: Never true    Ran Out of Food in the Last Year: Never true  Transportation Needs: No Transportation Needs (04/16/2023)   PRAPARE - Administrator, Civil Service (Medical): No    Lack of Transportation (Non-Medical): No  Physical Activity: Insufficiently Active (04/16/2023)   Exercise Vital Sign    Days of Exercise per Week: 1 day    Minutes of Exercise per Session: 70 min  Stress: No Stress Concern Present (04/16/2023)   [employer] of Occupational Health - Occupational Stress Questionnaire    Feeling of Stress : Not at all  Social Connections: Socially Integrated (04/16/2023)   Social Connection and Isolation Panel [NHANES]    Frequency of Communication with Friends and Family: More than three times a week    Frequency of Social Gatherings with Friends and Family: Once a week    Attends Religious Services: More than 4  times per year    Active Member of Clubs or Organizations: Yes    Attends Banker Meetings: More than 4 times per year    Marital Status: Married  Catering manager Violence: Not At Risk (02/18/2023)   Humiliation, Afraid, Rape, and Kick questionnaire    Fear of Current or Ex-Partner: No    Emotionally Abused: No    Physically Abused: No    Sexually Abused: No   FAMILY HISTORY: Sister - uterine/ovarian cancer 29's  Mother-- lived into her 66's Father - died in 44's - fall/trauma  ALLERGIES:  has No Known Allergies.  MEDICATIONS:  Current Outpatient  Medications on File Prior to Visit  Medication Sig Dispense Refill   atorvastatin (LIPITOR) 20 MG tablet Take 1 tablet (20 mg total) by mouth at bedtime. 90 tablet 1   ELIQUIS 5 MG TABS tablet Take 1 tablet by mouth twice daily 60 tablet 0   ergocalciferol (VITAMIN D2) 1.25 MG (50000 UT) capsule Take 1 capsule (50,000 Units total) by mouth once a week. 30 capsule 0   FERREX 150 150 MG capsule Take 1 capsule by mouth once daily 90 capsule 0   Krill Oil (OMEGA-3) 500 MG CAPS      latanoprost (XALATAN) 0.005 % ophthalmic solution SMARTSIG:1 Drop(s) In Eye(s) Every Evening     Magnesium Oxide 400 MG CAPS      Multiple Vitamins-Minerals (MULTIVITAMIN MEN 50+) TABS Take by mouth.     olmesartan (BENICAR) 20 MG tablet Take 1 tablet (20 mg total) by mouth daily. 90 tablet 1   propranolol (INDERAL) 20 MG tablet Take 1 tablet by mouth twice daily 180 tablet 0   REVLIMID 10 MG capsule TAKE 1 CAPSULE BY MOUTH 1 TIME A DAY FOR 21 DAYS ON AND 7 DAYS OFF 21 capsule 0   erythromycin ophthalmic ointment Place 1 Application into both eyes in the morning and at bedtime. (Patient not taking: Reported on 06/10/2023) 3.5 g 1   gabapentin (NEURONTIN) 100 MG capsule Take 1 capsule (100 mg total) by mouth 2 (two) times daily as needed. (Patient not taking: Reported on 06/10/2023) 60 capsule 3   loratadine (CLARITIN) 10 MG tablet Take 10 mg by mouth daily. (Patient not taking: Reported on 06/10/2023)     Olopatadine HCl 0.2 % SOLN Apply 1 drop to eye daily. (Patient not taking: Reported on 06/10/2023) 2.5 mL 1   No current facility-administered medications on file prior to visit.     REVIEW OF SYSTEMS:   10 Point review of Systems was done is negative except as noted above.  PHYSICAL EXAMINATION: .BP 136/78 (BP Location: Left Arm, Patient Position: Sitting)   Pulse 60   Temp 98.1 F (36.7 C) (Oral)   Resp 16   Wt 219 lb 14.4 oz (99.7 kg)   SpO2 100%   BMI 29.01 kg/m   NAD GENERAL:alert, in no acute distress  and comfortable SKIN: no acute rashes, no significant lesions EYES: conjunctiva are pink and non-injected, sclera anicteric OROPHARYNX: MMM, no exudates, no oropharyngeal erythema or ulceration NECK: supple, no JVD LYMPH:  no palpable lymphadenopathy in the cervical or supraclavicular regions LUNGS: clear to auscultation b/l with normal respiratory effort HEART: regular rate & rhythm Extremity: no pedal edema PSYCH: alert & oriented x 3 with fluent speech NEURO: no focal motor/sensory deficits   LABORATORY DATA:  I have reviewed the data as listed  .    Latest Ref Rng & Units 06/02/2023   12:28 PM 03/31/2023  11:48 AM 02/09/2023   12:24 PM  CBC  WBC 4.0 - 10.5 K/uL 2.6  2.3  3.4   Hemoglobin 13.0 - 17.0 g/dL 16.1  09.6  04.5   Hematocrit 39.0 - 52.0 % 42.0  41.1  41.9   Platelets 150 - 400 K/uL 124  125  217    CBC    Component Value Date/Time   WBC 2.6 (L) 06/02/2023 1228   WBC 5.1 08/11/2022 0807   RBC 5.00 06/02/2023 1228   HGB 13.8 06/02/2023 1228   HCT 42.0 06/02/2023 1228   PLT 124 (L) 06/02/2023 1228   MCV 84.0 06/02/2023 1228   MCH 27.6 06/02/2023 1228   MCHC 32.9 06/02/2023 1228   RDW 15.3 06/02/2023 1228   LYMPHSABS 1.0 06/02/2023 1228   MONOABS 0.4 06/02/2023 1228   EOSABS 0.1 06/02/2023 1228   BASOSABS 0.0 06/02/2023 1228    .    Latest Ref Rng & Units 06/02/2023   12:28 PM 03/31/2023   11:48 AM 02/09/2023   12:24 PM  CMP  Glucose 70 - 99 mg/dL 409  78  811   BUN 8 - 23 mg/dL 19  13  15    Creatinine 0.61 - 1.24 mg/dL 9.14  7.82  9.56   Sodium 135 - 145 mmol/L 140  140  140   Potassium 3.5 - 5.1 mmol/L 4.1  4.0  4.8   Chloride 98 - 111 mmol/L 108  108  108   CO2 22 - 32 mmol/L 28  27  30    Calcium 8.9 - 10.3 mg/dL 21.3  9.7  08.6   Total Protein 6.5 - 8.1 g/dL 6.6  6.0  6.3   Total Bilirubin 0.3 - 1.2 mg/dL 0.9  1.2  0.5   Alkaline Phos 38 - 126 U/L 27  23  30    AST 15 - 41 U/L 27  27  22    ALT 0 - 44 U/L 23  28  21        IEOP/Immunofixation Order: [id number] Component 9 d ago  PATHOLOGIST CHARGE ID  [id number] - Ambrose Finland, M.D.   SERUM IEOP COMMENT   NO DEFINITE CLONALITY DETECTED.   Reviewed and Interpreted By   Ambrose Finland, M.D.       RADIOGRAPHIC STUDIES:   [place] lower extremity venous ([id number]) done 08/19/2022 revealed "Summary:  RIGHT:  - Findings consistent with acute deep vein thrombosis involving the right  proximal, mid, and distal femoral vein, right popliteal vein, and right  peroneal veins.  - No cystic structure found in the popliteal fossa.     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 Most recent PET restaging scan done 08/13/2022 showed no overt signs of disease progression. Myeloma FISH panel no reported mutations. Cytogenetics-loss of Y chromosome.  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  3) H/O DVT of the right lower extremity -Ultrasound right lower extremity 08/19/2022 showed acute deep vein thrombosis involving the right  proximal, mid, and distal femoral vein, right popliteal vein, and right peroneal veins.  --Continues on anticoagulation.  4) history of stye -continue f/u with ophthamology -Currently not an acute issue and has resolved since the patient has been off Velcade.  PLAN: -Reviewed labs from 06/02/2023. WBC 2.6, Hgb 13.8, Plt 124K. Creatinine and LFTs normal. Myeloma panel shows no M protein detected and kappa light  chain has improved to 23.4.  -Patient has been tolerating his Revlimid dosage well without any new or severe toxicities.   -Recommend to continue Revlimid maintenance at 10 mg p.o. 3 weeks on 1 week off. -Continue Zometa every 8 weeks  -Continue Eliquis 5 mg p.o. twice daily while on Revlimid.   FOLLOW-UP: 8 weeks: labs only 9 weeks: follow up with Dr. Candise Che and Zometa infusion    All of the patient's questions were answered with apparent satisfaction. The patient knows to call the clinic with any problems, questions or concerns.  I have spent a total of 30 minutes minutes of face-to-face and non-face-to-face time, preparing to see the patient,  performing a medically appropriate examination, counseling and educating the patient, ordering tests/procedures, documenting clinical information in the electronic health record, and care coordination.   Georga Kaufmann PA-C Dept of Hematology and Oncology [facility] Phone: [phone]

## 2023-06-16 ENCOUNTER — Ambulatory Visit: Payer: Medicare Other

## 2023-06-16 ENCOUNTER — Ambulatory Visit: Payer: Medicare Other | Admitting: Hematology

## 2023-06-16 ENCOUNTER — Other Ambulatory Visit: Payer: Medicare Other

## 2023-06-19 ENCOUNTER — Other Ambulatory Visit: Payer: Self-pay | Admitting: Hematology

## 2023-06-19 DIAGNOSIS — C9000 Multiple myeloma not having achieved remission: Secondary | ICD-10-CM

## 2023-06-29 NOTE — Progress Notes (Unsigned)
Assessment/Plan:    1.  Essential Tremor  Continue propranolol 20 mg bid.    -cannot be on primidone due to eliquis  2.  MM  -following with hematology  3.  Chemo induced PN  -following with PCP and hematology.  On low dose gabapentin  Subjective:   Nathan Martinez was seen today in follow up for essential tremor.  My previous records were reviewed prior to todays visit.  After last visit, I had the opportunity to speak with patient's primary care physician, who was agreeable to propranolol.  Patient was started on propranolol, 20 mg twice per day.  He has done well with this, without side effects.  I have reviewed records from primary care, however, and his pulse in the primary care office was only 50 but its 70 today.  He has no near syncope and the medication is helping!   Current prescribed movement disorder medications: Propranolol, 20 mg twice per day (started last visit)   PREVIOUS MEDICATIONS: Cannot be on primidone because of Eliquis  ALLERGIES:  No Known Allergies  CURRENT MEDICATIONS:  Current Meds  Medication Sig   atorvastatin (LIPITOR) 20 MG tablet Take 1 tablet (20 mg total) by mouth at bedtime.   ELIQUIS 5 MG TABS tablet Take 1 tablet by mouth twice daily   ergocalciferol (VITAMIN D2) 1.25 MG (50000 UT) capsule Take 1 capsule (50,000 Units total) by mouth once a week.   FERREX 150 150 MG capsule Take 1 capsule by mouth once daily   gabapentin (NEURONTIN) 100 MG capsule Take 1 capsule (100 mg total) by mouth 2 (two) times daily as needed.   Krill Oil (OMEGA-3) 500 MG CAPS    Magnesium Oxide 400 MG CAPS    Multiple Vitamins-Minerals (MULTIVITAMIN MEN 50+) TABS Take by mouth.   olmesartan (BENICAR) 20 MG tablet Take 1 tablet (20 mg total) by mouth daily.   propranolol (INDERAL) 20 MG tablet Take 1 tablet by mouth twice daily   REVLIMID 10 MG capsule TAKE 1 CAPSULE BY MOUTH 1 TIME A DAY FOR 21 DAYS ON AND 7 DAYS OFF      Objective:    PHYSICAL  EXAMINATION:    VITALS:   Vitals:   07/01/23 0947  BP: (!) 136/90  Pulse: 71  SpO2: 96%  Weight: 224 lb 6.4 oz (101.8 kg)  Height: 6\' 1"  (1.854 m)    GEN:  The patient appears stated age and is in NAD. HEENT:  Normocephalic, atraumatic.  The mucous membranes are moist. The superficial temporal arteries are without ropiness or tenderness. CV:  RRR Lungs:  CTAB Neck/HEME:  There are no carotid bruits bilaterally.  Neurological examination:  Orientation: The patient is alert and oriented x3. Cranial nerves: There is good facial symmetry. The speech is fluent and clear. Soft palate rises symmetrically and there is no tongue deviation. Hearing is intact to conversational tone. Sensation: Sensation is intact to light touch throughout Motor: Strength is at least antigravity x4.  Movement examination: Tone: There is normal tone in the UE/LE Abnormal movements: mild postural tremor and intention tremor bilaterally.  Archimedes spiral with mild tremor on the right but improved c/t prior.   Gait and Station: The patient has no difficulty arising out of a deep-seated chair without the use of the hands. The patient's stride length is good I have reviewed and interpreted the following labs independently   Chemistry      Component Value Date/Time   NA 140 06/02/2023 1228  K 4.1 06/02/2023 1228   CL 108 06/02/2023 1228   CO2 28 06/02/2023 1228   BUN 19 06/02/2023 1228   CREATININE 1.23 06/02/2023 1228      Component Value Date/Time   CALCIUM 10.4 (H) 06/02/2023 1228   CALCIUM 10.7 (H) 12/12/2021 1225   ALKPHOS 27 (L) 06/02/2023 1228   AST 27 06/02/2023 1228   ALT 23 06/02/2023 1228   BILITOT 0.9 06/02/2023 1228      Lab Results  Component Value Date   WBC 2.6 (L) 06/02/2023   HGB 13.8 06/02/2023   HCT 42.0 06/02/2023   MCV 84.0 06/02/2023   PLT 124 (L) 06/02/2023   Lab Results  Component Value Date   TSH 1.295 12/12/2021     Chemistry      Component Value Date/Time    NA 140 06/02/2023 1228   K 4.1 06/02/2023 1228   CL 108 06/02/2023 1228   CO2 28 06/02/2023 1228   BUN 19 06/02/2023 1228   CREATININE 1.23 06/02/2023 1228      Component Value Date/Time   CALCIUM 10.4 (H) 06/02/2023 1228   CALCIUM 10.7 (H) 12/12/2021 1225   ALKPHOS 27 (L) 06/02/2023 1228   AST 27 06/02/2023 1228   ALT 23 06/02/2023 1228   BILITOT 0.9 06/02/2023 1228        Cc:  Shade Flood, MD

## 2023-07-01 ENCOUNTER — Ambulatory Visit (INDEPENDENT_AMBULATORY_CARE_PROVIDER_SITE_OTHER): Payer: Medicare Other | Admitting: Neurology

## 2023-07-01 ENCOUNTER — Encounter: Payer: Self-pay | Admitting: Neurology

## 2023-07-01 VITALS — BP 136/90 | HR 71 | Ht 73.0 in | Wt 224.4 lb

## 2023-07-01 DIAGNOSIS — G250 Essential tremor: Secondary | ICD-10-CM | POA: Diagnosis not present

## 2023-07-04 ENCOUNTER — Other Ambulatory Visit: Payer: Self-pay | Admitting: Family Medicine

## 2023-07-04 DIAGNOSIS — G620 Drug-induced polyneuropathy: Secondary | ICD-10-CM

## 2023-07-06 ENCOUNTER — Other Ambulatory Visit: Payer: Self-pay | Admitting: Hematology

## 2023-07-06 ENCOUNTER — Other Ambulatory Visit: Payer: Self-pay

## 2023-07-06 DIAGNOSIS — C9000 Multiple myeloma not having achieved remission: Secondary | ICD-10-CM

## 2023-07-06 MED ORDER — APIXABAN 5 MG PO TABS
5.0000 mg | ORAL_TABLET | Freq: Two times a day (BID) | ORAL | 0 refills | Status: DC
Start: 2023-07-06 — End: 2023-07-06

## 2023-07-06 MED ORDER — APIXABAN 5 MG PO TABS
5.0000 mg | ORAL_TABLET | Freq: Two times a day (BID) | ORAL | 0 refills | Status: DC
Start: 2023-07-06 — End: 2023-07-07

## 2023-07-07 ENCOUNTER — Other Ambulatory Visit: Payer: Self-pay | Admitting: Hematology

## 2023-07-07 ENCOUNTER — Telehealth: Payer: Self-pay | Admitting: Family Medicine

## 2023-07-07 DIAGNOSIS — G620 Drug-induced polyneuropathy: Secondary | ICD-10-CM

## 2023-07-07 DIAGNOSIS — C9000 Multiple myeloma not having achieved remission: Secondary | ICD-10-CM

## 2023-07-07 MED ORDER — GABAPENTIN 100 MG PO CAPS: 100.0000 mg | ORAL_CAPSULE | Freq: Two times a day (BID) | ORAL | 3 refills | Status: DC | PRN

## 2023-07-07 NOTE — Telephone Encounter (Signed)
Encourage patient to contact the pharmacy for refills or they can request refills through [facility]  WHAT PHARMACY WOULD THEY LIKE THIS SENT TO:  [id number] - [place], [place] - [id number]   MEDICATION NAME & DOSE: gabapentin (NEURONTIN) 100 MG capsule  NOTES/COMMENTS FROM PATIENT:      Front office please notify patient: It takes 48-72 hours to process rx refill requests Ask patient to call pharmacy to ensure rx is ready before heading there.

## 2023-07-07 NOTE — Telephone Encounter (Signed)
Completed request.  

## 2023-07-16 ENCOUNTER — Other Ambulatory Visit: Payer: Self-pay | Admitting: Hematology

## 2023-07-16 DIAGNOSIS — C9000 Multiple myeloma not having achieved remission: Secondary | ICD-10-CM

## 2023-07-17 ENCOUNTER — Other Ambulatory Visit: Payer: Self-pay

## 2023-07-17 ENCOUNTER — Encounter: Payer: Self-pay | Admitting: Hematology

## 2023-07-22 ENCOUNTER — Other Ambulatory Visit: Payer: Self-pay | Admitting: Oncology

## 2023-07-22 DIAGNOSIS — D472 Monoclonal gammopathy: Secondary | ICD-10-CM

## 2023-08-03 ENCOUNTER — Other Ambulatory Visit: Payer: Self-pay | Admitting: Hematology

## 2023-08-03 DIAGNOSIS — C9000 Multiple myeloma not having achieved remission: Secondary | ICD-10-CM

## 2023-08-05 ENCOUNTER — Other Ambulatory Visit: Payer: Self-pay

## 2023-08-05 ENCOUNTER — Inpatient Hospital Stay: Payer: Medicare Other | Attending: Hematology

## 2023-08-05 DIAGNOSIS — C9000 Multiple myeloma not having achieved remission: Secondary | ICD-10-CM | POA: Diagnosis present

## 2023-08-05 LAB — CBC WITH DIFFERENTIAL (CANCER CENTER ONLY)
Abs Immature Granulocytes: 0.01 10*3/uL (ref 0.00–0.07)
Basophils Absolute: 0 10*3/uL (ref 0.0–0.1)
Basophils Relative: 1 %
Eosinophils Absolute: 0.1 10*3/uL (ref 0.0–0.5)
Eosinophils Relative: 5 %
HCT: 42.6 % (ref 39.0–52.0)
Hemoglobin: 14 g/dL (ref 13.0–17.0)
Immature Granulocytes: 0 %
Lymphocytes Relative: 26 %
Lymphs Abs: 0.7 10*3/uL (ref 0.7–4.0)
MCH: 27.5 pg (ref 26.0–34.0)
MCHC: 32.9 g/dL (ref 30.0–36.0)
MCV: 83.5 fL (ref 80.0–100.0)
Monocytes Absolute: 0.5 10*3/uL (ref 0.1–1.0)
Monocytes Relative: 18 %
Neutro Abs: 1.4 10*3/uL — ABNORMAL LOW (ref 1.7–7.7)
Neutrophils Relative %: 50 %
Platelet Count: 132 10*3/uL — ABNORMAL LOW (ref 150–400)
RBC: 5.1 MIL/uL (ref 4.22–5.81)
RDW: 15 % (ref 11.5–15.5)
WBC Count: 2.8 10*3/uL — ABNORMAL LOW (ref 4.0–10.5)
nRBC: 0 % (ref 0.0–0.2)

## 2023-08-05 LAB — CMP (CANCER CENTER ONLY)
ALT: 22 U/L (ref 0–44)
AST: 25 U/L (ref 15–41)
Albumin: 3.8 g/dL (ref 3.5–5.0)
Alkaline Phosphatase: 28 U/L — ABNORMAL LOW (ref 38–126)
Anion gap: 3 — ABNORMAL LOW (ref 5–15)
BUN: 14 mg/dL (ref 8–23)
CO2: 28 mmol/L (ref 22–32)
Calcium: 10.2 mg/dL (ref 8.9–10.3)
Chloride: 109 mmol/L (ref 98–111)
Creatinine: 1.18 mg/dL (ref 0.61–1.24)
GFR, Estimated: >60 mL/min (ref >60)
Glucose, Bld: 104 mg/dL — ABNORMAL HIGH (ref 70–99)
Potassium: 4.1 mmol/L (ref 3.5–5.1)
Sodium: 140 mmol/L (ref 135–145)
Total Bilirubin: 0.7 mg/dL (ref 0.3–1.2)
Total Protein: 6.4 g/dL — ABNORMAL LOW (ref 6.5–8.1)

## 2023-08-06 LAB — KAPPA/LAMBDA LIGHT CHAINS
Kappa free light chain: 26 mg/L — ABNORMAL HIGH (ref 3.3–19.4)
Kappa, lambda light chain ratio: 1.68 — ABNORMAL HIGH (ref 0.26–1.65)
Lambda free light chains: 15.5 mg/L (ref 5.7–26.3)

## 2023-08-12 ENCOUNTER — Inpatient Hospital Stay: Payer: Medicare Other

## 2023-08-12 ENCOUNTER — Other Ambulatory Visit: Payer: Self-pay | Admitting: Hematology

## 2023-08-12 ENCOUNTER — Inpatient Hospital Stay (HOSPITAL_BASED_OUTPATIENT_CLINIC_OR_DEPARTMENT_OTHER): Payer: Medicare Other | Admitting: Physician Assistant

## 2023-08-12 VITALS — BP 151/76 | HR 56 | Temp 97.5°F | Resp 20 | Wt 221.2 lb

## 2023-08-12 DIAGNOSIS — C9001 Multiple myeloma in remission: Secondary | ICD-10-CM

## 2023-08-12 DIAGNOSIS — C9000 Multiple myeloma not having achieved remission: Secondary | ICD-10-CM | POA: Diagnosis not present

## 2023-08-12 MED ORDER — SODIUM CHLORIDE 0.9 % IV SOLN: Freq: Once | INTRAVENOUS | Status: AC

## 2023-08-12 MED ORDER — ZOLEDRONIC ACID 4 MG/100ML IV SOLN
4.0000 mg | Freq: Once | INTRAVENOUS | Status: AC
  Administered 2023-08-12: 4 mg via INTRAVENOUS
  Filled 2023-08-12: qty 100

## 2023-08-12 NOTE — Patient Instructions (Signed)

## 2023-08-12 NOTE — Progress Notes (Signed)
[name]   HEMATOLOGY/ONCOLOGY CLINIC VISIT NOTE   Date of Service: 08/12/23  Patient Care Team: Shade Flood, MD as PCP - General (Family Medicine) [name], MD as Consulting Physician (Hematology)  CHIEF COMPLAINTS/PURPOSE OF CONSULTATION:  Follow-up for continued evaluation and management of multiple myeloma  INTERVAL HISTORY  Nathan Martinez is here for continued evaluation and management of multiple myeloma. He was last seen by me on 06/10/2023. He is accompanied by his wife for this visit.   Nathan Martinez continues to tolerate Revlimid therapy without any new or concerning toxicities.  He reports his energy and appetite are stable. He denies nausea, vomiting or bowel habit changes. He does have intermittent episodes of low back pain that is chronic in nature and has not worsened since the last visit. He has no new bone/back pain. He is otherwise feeling well. He denies fevers, chills, sweats, shortness of breath,chest pain or cough. He has no other complaints. Rest of the ROS is reviewed and negative.    MEDICAL HISTORY:  Past Medical History:  Diagnosis Date   DVT (deep vein thrombosis) in pregnancy    Hyperlipidemia    Hypertension    Multiple myeloma (HCC)    Peripheral neuropathy     SURGICAL HISTORY: Past Surgical History:  Procedure Laterality Date   CATARACT EXTRACTION, BILATERAL     KNEE SURGERY Right 2018   PARATHYROID EXPLORATION       SOCIAL HISTORY: Social History   Socioeconomic History   Marital status: Married    Spouse name: Not on file   Number of children: Not on file   Years of education: Not on file   Highest education level: Bachelor's degree (e.g., BA, AB, BS)  Occupational History   Occupation: retired    Comment: Manufacturing systems engineer  Tobacco Use   Smoking status: Never   Smokeless tobacco: Never  Vaping Use   Vaping status: Never Used  Substance and Sexual Activity   Alcohol use: Not Currently   Drug use: Never   Sexual activity:  Not Currently    Birth control/protection: None  Other Topics Concern   Not on file  Social History Narrative   Right handed   retired   International aid/development worker of Corporate investment banker Strain: Low Risk  (04/16/2023)   Overall Financial Resource Strain (CARDIA)    Difficulty of Paying Living Expenses: Not hard at all  Food Insecurity: No Food Insecurity (04/16/2023)   Hunger Vital Sign    Worried About Running Out of Food in the Last Year: Never true    Ran Out of Food in the Last Year: Never true  Transportation Needs: No Transportation Needs (04/16/2023)   PRAPARE - Administrator, Civil Service (Medical): No    Lack of Transportation (Non-Medical): No  Physical Activity: Insufficiently Active (04/16/2023)   Exercise Vital Sign    Days of Exercise per Week: 1 day    Minutes of Exercise per Session: 70 min  Stress: No Stress Concern Present (04/16/2023)   [employer] of Occupational Health - Occupational Stress Questionnaire    Feeling of Stress : Not at all  Social Connections: Socially Integrated (04/16/2023)   Social Connection and Isolation Panel [NHANES]    Frequency of Communication with Friends and Family: More than three times a week    Frequency of Social Gatherings with Friends and Family: Once a week    Attends Religious Services: More than 4 times per year  Active Member of Clubs or Organizations: Yes    Attends Banker Meetings: More than 4 times per year    Marital Status: Married  Catering manager Violence: Not At Risk (02/18/2023)   Humiliation, Afraid, Rape, and Kick questionnaire    Fear of Current or Ex-Partner: No    Emotionally Abused: No    Physically Abused: No    Sexually Abused: No   FAMILY HISTORY: Sister - uterine/ovarian cancer 19's  Mother-- lived into her 66's Father - died in 47's - fall/trauma  ALLERGIES:  has No Known Allergies.  MEDICATIONS:  Current Outpatient Medications on File Prior to Visit   Medication Sig Dispense Refill   atorvastatin (LIPITOR) 20 MG tablet Take 1 tablet (20 mg total) by mouth at bedtime. 90 tablet 1   ELIQUIS 5 MG TABS tablet Take 1 tablet by mouth twice daily 60 tablet 0   ergocalciferol (VITAMIN D2) 1.25 MG (50000 UT) capsule Take 1 capsule (50,000 Units total) by mouth once a week. 30 capsule 0   FERREX 150 150 MG capsule Take 1 capsule by mouth once daily 90 capsule 0   gabapentin (NEURONTIN) 100 MG capsule Take 1 capsule (100 mg total) by mouth 2 (two) times daily as needed. 60 capsule 3   Krill Oil (OMEGA-3) 500 MG CAPS      latanoprost (XALATAN) 0.005 % ophthalmic solution SMARTSIG:1 Drop(s) In Eye(s) Every Evening     Magnesium Oxide 400 MG CAPS      Multiple Vitamins-Minerals (MULTIVITAMIN MEN 50+) TABS Take by mouth.     olmesartan (BENICAR) 20 MG tablet Take 1 tablet (20 mg total) by mouth daily. 90 tablet 1   propranolol (INDERAL) 20 MG tablet Take 1 tablet by mouth twice daily 180 tablet 0   REVLIMID 10 MG capsule TAKE 1 CAPSULE BY MOUTH 1 TIME A DAY FOR 21 DAYS ON AND 7 DAYS OFF 21 capsule 0   erythromycin ophthalmic ointment Place 1 Application into both eyes in the morning and at bedtime. 3.5 g 1   No current facility-administered medications on file prior to visit.     REVIEW OF SYSTEMS:   10 Point review of Systems was done is negative except as noted above.  PHYSICAL EXAMINATION: .BP (!) 151/76   Pulse (!) 56   Temp (!) 97.5 F (36.4 C)   Resp 20   Wt 221 lb 3.2 oz (100.3 kg)   SpO2 100%   BMI 29.18 kg/m   NAD GENERAL:alert, in no acute distress and comfortable SKIN: no acute rashes, no significant lesions EYES: conjunctiva are pink and non-injected, sclera anicteric LUNGS: clear to auscultation b/l with normal respiratory effort HEART: regular rate & rhythm Extremity: no pedal edema PSYCH: alert & oriented x 3 with fluent speech NEURO: no focal motor/sensory deficits   LABORATORY DATA:  I have reviewed the data as  listed  .    Latest Ref Rng & Units 08/05/2023   12:44 PM 06/02/2023   12:28 PM 03/31/2023   11:48 AM  CBC  WBC 4.0 - 10.5 K/uL 2.8  2.6  2.3   Hemoglobin 13.0 - 17.0 g/dL 16.1  09.6  04.5   Hematocrit 39.0 - 52.0 % 42.6  42.0  41.1   Platelets 150 - 400 K/uL 132  124  125    CBC    Component Value Date/Time   WBC 2.8 (L) 08/05/2023 1244   WBC 5.1 08/11/2022 0807   RBC 5.10 08/05/2023 1244   HGB  14.0 08/05/2023 1244   HCT 42.6 08/05/2023 1244   PLT 132 (L) 08/05/2023 1244   MCV 83.5 08/05/2023 1244   MCH 27.5 08/05/2023 1244   MCHC 32.9 08/05/2023 1244   RDW 15.0 08/05/2023 1244   LYMPHSABS 0.7 08/05/2023 1244   MONOABS 0.5 08/05/2023 1244   EOSABS 0.1 08/05/2023 1244   BASOSABS 0.0 08/05/2023 1244    .    Latest Ref Rng & Units 08/05/2023   12:44 PM 06/02/2023   12:28 PM 03/31/2023   11:48 AM  CMP  Glucose 70 - 99 mg/dL 161  096  78   BUN 8 - 23 mg/dL 14  19  13    Creatinine 0.61 - 1.24 mg/dL 0.45  4.09  8.11   Sodium 135 - 145 mmol/L 140  140  140   Potassium 3.5 - 5.1 mmol/L 4.1  4.1  4.0   Chloride 98 - 111 mmol/L 109  108  108   CO2 22 - 32 mmol/L 28  28  27    Calcium 8.9 - 10.3 mg/dL 91.4  78.2  9.7   Total Protein 6.5 - 8.1 g/dL 6.4  6.6  6.0   Total Bilirubin 0.3 - 1.2 mg/dL 0.7  0.9  1.2   Alkaline Phos 38 - 126 U/L 28  27  23    AST 15 - 41 U/L 25  27  27    ALT 0 - 44 U/L 22  23  28       IEOP/Immunofixation Order: [id number] Component 9 d ago  PATHOLOGIST CHARGE ID  [id number] - Ambrose Finland, M.D.   SERUM IEOP COMMENT   NO DEFINITE CLONALITY DETECTED.   Reviewed and Interpreted By   Ambrose Finland, M.D.       RADIOGRAPHIC STUDIES:   [place] lower extremity venous ([id number]) done 08/19/2022 revealed "Summary:  RIGHT:  - Findings consistent with acute deep vein thrombosis involving the right  proximal, mid, and distal femoral vein, right popliteal vein, and right  peroneal veins.  - No cystic structure found in the popliteal  fossa.     LEFT:  - No evidence of common femoral vein obstruction."  ASSESSMENT & PLAN:  Nathan Martinez is a 77 y.o. male who presents for continued management of multiple myeloma.   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 -Most recent PET restaging scan done 08/13/2022 showed no overt signs of disease progression. -Myeloma FISH panel no reported mutations. -Cytogenetics-loss of Y chromosome.  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  3) H/O DVT of the right lower extremity -Ultrasound right lower extremity 08/19/2022 showed acute deep vein thrombosis involving the right  proximal, mid, and distal femoral vein, right popliteal vein, and right peroneal veins.  --Continues on anticoagulation.  4) history of stye -continue f/u with ophthamology -Currently not an acute issue and has resolved since the patient has been off Velcade.  PLAN: -Reviewed labs from 08/05/2023. WBC 2.8, Hgb 14.0, Plt 132K. Creatinine and LFTs normal. Myeloma panel is pending. Kappa light chain stable at 26.0, lambda 15.5, ratio 1.68.  -Patient has been tolerating his Revlimid dosage well without any new or severe toxicities.   -Recommend to continue Revlimid maintenance at 10 mg p.o. 3 weeks on 1 week off. -Continue Zometa every 8 weeks  -  Continue Eliquis 5 mg p.o. twice daily while on Revlimid.   FOLLOW-UP: 8 weeks: labs, follow up with Dr. Candise Che and Zometa infusion    All of the patient's questions were answered with apparent satisfaction. The patient knows to call the clinic with any problems, questions or concerns.  I have spent a total of 30 minutes minutes of face-to-face and non-face-to-face time, preparing to see the patient,   performing a medically appropriate examination, counseling and educating the patient, ordering tests/procedures, documenting clinical information in the electronic health record, and care coordination.   Georga Kaufmann PA-C Dept of Hematology and Oncology [facility] Phone: [phone]

## 2023-08-20 ENCOUNTER — Other Ambulatory Visit: Payer: Self-pay | Admitting: Family Medicine

## 2023-08-20 DIAGNOSIS — I10 Essential (primary) hypertension: Secondary | ICD-10-CM

## 2023-08-20 NOTE — Telephone Encounter (Signed)
Last refill 11/10/2022 90 day 1 refill Upcoming appt. 11/12/2023 Last appt 07/04/2023

## 2023-09-03 ENCOUNTER — Other Ambulatory Visit: Payer: Self-pay | Admitting: Neurology

## 2023-09-03 DIAGNOSIS — G250 Essential tremor: Secondary | ICD-10-CM

## 2023-09-09 ENCOUNTER — Other Ambulatory Visit: Payer: Self-pay | Admitting: Hematology

## 2023-09-09 DIAGNOSIS — C9000 Multiple myeloma not having achieved remission: Secondary | ICD-10-CM

## 2023-09-22 ENCOUNTER — Ambulatory Visit (INDEPENDENT_AMBULATORY_CARE_PROVIDER_SITE_OTHER): Payer: Medicare Other | Admitting: Family Medicine

## 2023-09-22 ENCOUNTER — Encounter: Payer: Self-pay | Admitting: Family Medicine

## 2023-09-22 VITALS — BP 156/78 | HR 66 | Ht 73.0 in | Wt 222.6 lb

## 2023-09-22 DIAGNOSIS — I10 Essential (primary) hypertension: Secondary | ICD-10-CM | POA: Diagnosis not present

## 2023-09-22 DIAGNOSIS — R001 Bradycardia, unspecified: Secondary | ICD-10-CM

## 2023-09-22 NOTE — Patient Instructions (Signed)
Take 1/2 of the propranolol twice/day   increase the olmesartan to 2/day starting tomorrow.  Get new batteries.   Monitor blood pressure 2-3x/day  Call Dr. Neva Seat for appt tomorrow.    Any changes, worse, ER

## 2023-09-22 NOTE — Progress Notes (Signed)
Subjective:    Patient ID: Nathan Martinez, male    DOB: April 25, 1946, 77 y.o.   MRN: [id number]  Chief Complaint  Patient presents with   Blood Pressure Concern    Pt states his BP has been higher than normal. Pt has been taking his BP by the hour today and has been getting readings averaging 170/80. Pt brought list with him today.    HPI HTN - Managed with Olmesartan 20 mg, for a while now.  Also on propranolol 20 mg twice daily for tremors.  Reports that they were well controlled 130-140s/60s-80s, but has recently been elevated in the 170s-180s/80s-90s. States he felt general malaise when he got up this morning, checked his blood pressure and noticed it was 185/96. Endorses mild headache, and one instance of dizziness when getting out of the car recently(yesterday), as well as excessive jitters today; denies slurred speech or extremity weakness, or recent vaccinations.  No chest pain/shortness of breath/edema.  Has had a lower heart rate for most of his life.  He had a Covid test done (NEG). Hasn't taken anything to manage symptoms. In-office BP's running 148/88 on initial check, 152/80 (L. Arm) & 156/78 (R. Arm) on recheck. Notes his BP monitor may need to be re-calibrated. Denies dizziness/chest pains/palp/edema/cough/sob.  There are no preventive care reminders to display for this patient.   Past Medical History:  Diagnosis Date   DVT (deep vein thrombosis) in pregnancy    Hyperlipidemia    Hypertension    Multiple myeloma (HCC)    Peripheral neuropathy    Past Surgical History:  Procedure Laterality Date   CATARACT EXTRACTION, BILATERAL     KNEE SURGERY Right 2018   PARATHYROID EXPLORATION     Current Outpatient Medications:    atorvastatin (LIPITOR) 20 MG tablet, Take 1 tablet (20 mg total) by mouth at bedtime., Disp: 90 tablet, Rfl: 1   ELIQUIS 5 MG TABS tablet, Take 1 tablet by mouth twice daily, Disp: 60 tablet, Rfl: 0   ergocalciferol (VITAMIN D2) 1.25 MG (50000 UT) capsule,  Take 1 capsule (50,000 Units total) by mouth once a week., Disp: 30 capsule, Rfl: 0   FERREX 150 150 MG capsule, Take 1 capsule by mouth once daily, Disp: 90 capsule, Rfl: 0   gabapentin (NEURONTIN) 100 MG capsule, Take 1 capsule (100 mg total) by mouth 2 (two) times daily as needed., Disp: 60 capsule, Rfl: 3   Krill Oil (OMEGA-3) 500 MG CAPS, , Disp: , Rfl:    latanoprost (XALATAN) 0.005 % ophthalmic solution, SMARTSIG:1 Drop(s) In Eye(s) Every Evening, Disp: , Rfl:    Magnesium Oxide 400 MG CAPS, , Disp: , Rfl:    Multiple Vitamins-Minerals (MULTIVITAMIN MEN 50+) TABS, Take by mouth., Disp: , Rfl:    olmesartan (BENICAR) 20 MG tablet, Take 1 tablet by mouth once daily, Disp: 90 tablet, Rfl: 0   propranolol (INDERAL) 20 MG tablet, Take 1 tablet by mouth twice daily, Disp: 180 tablet, Rfl: 0   REVLIMID 10 MG capsule, TAKE 1 CAPSULE BY MOUTH 1 TIME A DAY FOR 21 DAYS ON THEN 7 DAYS OFF, Disp: 21 capsule, Rfl: 0  No Known Allergies ROS neg/noncontributory except as noted HPI/below  Objective:  BP (!) 156/78 (BP Location: Right Arm, Patient Position: Sitting, Cuff Size: Large)   Pulse 66   Ht 6\' 1"  (1.854 m)   Wt 222 lb 9.6 oz (101 kg)   SpO2 100%   BMI 29.37 kg/m  Wt Readings from Last 3 Encounters:  09/22/23 222 lb 9.6 oz (101 kg)  08/12/23 221 lb 3.2 oz (100.3 kg)  07/01/23 224 lb 6.4 oz (101.8 kg)   Physical Exam   Gen: WDWN NAD HEENT: NCAT, conjunctiva not injected, sclera nonicteric NECK:  supple, no thyromegaly, no nodes, no carotid bruits CARDIAC: +Bradycardia with skip every 3rd beat, S1S2+, no murmur. DP 2+B. LUNGS: CTAB. No wheezes ABDOMEN:  BS+, soft, NTND, No HSM, no masses EXT:  no edema MSK: no gross abnormalities.  NEURO: A&O x3.  CN II-XII intact.  PSYCH: normal mood. Good eye contact  EKG.  Bradycardia, pauses every 3rd beat, RBBB. There are p waves-don't think a fib is correct. No old ones to compare  Reviewed h/o vitals, notes, labs Assessment & Plan:   Essential hypertension -     EKG 12-Lead  Bradycardia  1.  Hypertension-chronic.  Has been fairly well-controlled, however blood pressures are above goal.  Today, patient having a lot of high blood pressures and just feeling "off" blood pressure here is a lot less than what he is getting at home.  Advised to change batteries in his cuff.  Bring cuff to Dr. Thomasene Lot appointment to have it checked.  Will do a trial of decreasing propranolol to half a tab twice daily (more to see if bradycardia improves) and increase olmesartan to 40 mg daily.  Patient is already on Eliquis for DVT, but general blood thinner.  We discussed going to the emergency department, however he is very stable, no other red flags.  Not sure if his blood pressure cuff is functioning properly.  If worse, any changes-advised to go to the emergency room.  Follow-up with Dr. Chilton Si soon. 2.  Bradycardia-per patient whole life.  I did note many pauses when auscultating his heart, very regular.  He also has a right bundle branch block (not sure how long that has been present) see above plan for hypertension regarding decreasing propranolol.  Advised to call Dr. Chilton Si for appointment later this week.  Return if symptoms worsen or fail to improve.         I,Emily Lagle,acting as a Neurosurgeon for Angelena Sole, MD.,have documented all relevant documentation on the behalf of Angelena Sole, MD,as directed by  Angelena Sole, MD while in the presence of Angelena Sole, MD.  I, Angelena Sole, MD, have reviewed all documentation for this visit. The documentation on 09/22/23 for the exam, diagnosis, procedures, and orders are all accurate and complete.   Angelena Sole, MD

## 2023-09-28 ENCOUNTER — Ambulatory Visit (INDEPENDENT_AMBULATORY_CARE_PROVIDER_SITE_OTHER): Payer: Medicare Other | Admitting: Family Medicine

## 2023-09-28 VITALS — BP 134/82 | HR 55 | Temp 98.0°F | Ht 73.0 in | Wt 222.8 lb

## 2023-09-28 DIAGNOSIS — L723 Sebaceous cyst: Secondary | ICD-10-CM

## 2023-09-28 DIAGNOSIS — I10 Essential (primary) hypertension: Secondary | ICD-10-CM | POA: Diagnosis not present

## 2023-09-28 MED ORDER — DOXYCYCLINE HYCLATE 100 MG PO TABS
100.0000 mg | ORAL_TABLET | Freq: Two times a day (BID) | ORAL | 0 refills | Status: DC
Start: 2023-09-28 — End: 2023-11-12

## 2023-09-28 MED ORDER — AMLODIPINE BESYLATE 2.5 MG PO TABS
2.5000 mg | ORAL_TABLET | Freq: Every day | ORAL | 1 refills | Status: DC
Start: 2023-09-28 — End: 2023-12-08

## 2023-09-28 MED ORDER — OLMESARTAN MEDOXOMIL 40 MG PO TABS
40.0000 mg | ORAL_TABLET | Freq: Every day | ORAL | 1 refills | Status: DC
Start: 2023-09-28 — End: 2024-03-17

## 2023-09-28 NOTE — Progress Notes (Unsigned)
Subjective:  Patient ID: Nathan Martinez, male    DOB: 07-09-1946  Age: 77 y.o. MRN: [id number]  CC:  Chief Complaint  Patient presents with   Hypertension    Pt notes bp high last week    Mass    Pt notes has had a bump behind his Rt ear for about a week or some more but popped last night, notes it is feeling better since popping, about the size of a marble     HPI Nathan Martinez presents for   Hypertension: Seen by Dr. Ruthine Dose on October 29, and recently noted elevated readings in the 170s to 180s over the 80s to 90s.  185/96 that morning.  Mild headache,  1 instance of dizziness getting out of the car day prior.  In office blood pressures were 148/88, 152/80, 156/78.  Home meter appeared to be reading high.  Was noted to have some bradycardia, decreased his propranolol to half a tab twice per day, olmesartan was increased to 40 mg daily.  Has been on olmesartan 2 per day. 1/2 dose propranolol - no side effects.  No HA, lightheadedness, dizziness, slurred speech, or weakness.    Home readings: new batteries in machine. Lat checked few days ago - 160/90.  BP Readings from Last 3 Encounters:  09/28/23 134/82  09/22/23 (!) 156/78  08/12/23 (!) 151/76   Lab Results  Component Value Date   CREATININE 1.18 08/05/2023    R scalp lump: Present 2 weeks. Drained some blood and white material last night.    History Patient Active Problem List   Diagnosis Date Noted   Essential hypertension 09/22/2023   Multiple myeloma not having achieved remission (HCC) 04/16/2022   Multiple myeloma (HCC) 01/24/2022   Counseling regarding advance care planning and goals of care 01/24/2022   Past Medical History:  Diagnosis Date   DVT (deep vein thrombosis) in pregnancy    Hyperlipidemia    Hypertension    Multiple myeloma (HCC)    Peripheral neuropathy    Past Surgical History:  Procedure Laterality Date   CATARACT EXTRACTION, BILATERAL     KNEE SURGERY Right 2018   PARATHYROID EXPLORATION      No Known Allergies Prior to Admission medications   Medication Sig Start Date End Date Taking? Authorizing Provider  atorvastatin (LIPITOR) 20 MG tablet Take 1 tablet (20 mg total) by mouth at bedtime. 11/10/22   Shade Flood, MD  ELIQUIS 5 MG TABS tablet Take 1 tablet by mouth twice daily 08/03/23   [name], MD  ergocalciferol (VITAMIN D2) 1.25 MG (50000 UT) capsule Take 1 capsule (50,000 Units total) by mouth once a week. 11/10/22   Shade Flood, MD  FERREX 150 150 MG capsule Take 1 capsule by mouth once daily 07/22/23   Loni Muse, MD  gabapentin (NEURONTIN) 100 MG capsule Take 1 capsule (100 mg total) by mouth 2 (two) times daily as needed. 07/07/23   Shade Flood, MD  [place] Lan[facility] (OMEGA-3) 500 MG CAPS  09/30/16   [provider]  latanoprost (XALATAN) 0.005 % ophthalmic solution SMARTSIG:1 Drop(s) In Eye(s) Every Evening 01/28/22   [provider]  Magnesium Oxide 400 MG CAPS     [provider]  Multiple Vitamins-Minerals (MULTIVITAMIN MEN 50+) TABS Take by mouth.    [provider]  olmesartan (BENICAR) 20 MG tablet Take 1 tablet by mouth once daily 08/20/23   Shade Flood, MD  propranolol (INDERAL) 20 MG  tablet Take 1 tablet by mouth twice daily 09/03/23   Tat, Rebecca S, DO  REVLIMID 10 MG capsule TAKE 1 CAPSULE BY MOUTH 1 TIME A DAY FOR 21 DAYS ON THEN 7 DAYS OFF 09/09/23   [name], MD   Social History   Socioeconomic History   Marital status: Married    Spouse name: Not on file   Number of children: Not on file   Years of education: Not on file   Highest education level: Bachelor's degree (e.g., BA, AB, BS)  Occupational History   Occupation: retired    Comment: Manufacturing systems engineer  Tobacco Use   Smoking status: Never   Smokeless tobacco: Never  Vaping Use   Vaping status: Never Used  Substance and Sexual Activity   Alcohol use: Not Currently   Drug use: Never   Sexual activity: Not  Currently    Birth control/protection: None  Other Topics Concern   Not on file  Social History Narrative   Right handed   retired   International aid/development worker of Corporate investment banker Strain: Low Risk  (09/24/2023)   Overall Financial Resource Strain (CARDIA)    Difficulty of Paying Living Expenses: Not hard at all  Food Insecurity: No Food Insecurity (09/24/2023)   Hunger Vital Sign    Worried About Running Out of Food in the Last Year: Never true    Ran Out of Food in the Last Year: Never true  Transportation Needs: No Transportation Needs (09/24/2023)   PRAPARE - Administrator, Civil Service (Medical): No    Lack of Transportation (Non-Medical): No  Physical Activity: Insufficiently Active (09/24/2023)   Exercise Vital Sign    Days of Exercise per Week: 2 days    Minutes of Exercise per Session: 30 min  Stress: No Stress Concern Present (09/24/2023)   [employer] of Occupational Health - Occupational Stress Questionnaire    Feeling of Stress : Not at all  Social Connections: Socially Integrated (09/24/2023)   Social Connection and Isolation Panel [NHANES]    Frequency of Communication with Friends and Family: More than three times a week    Frequency of Social Gatherings with Friends and Family: Three times a week    Attends Religious Services: More than 4 times per year    Active Member of Clubs or Organizations: No    Attends Banker Meetings: More than 4 times per year    Marital Status: Married  Catering manager Violence: Not At Risk (02/18/2023)   Humiliation, Afraid, Rape, and Kick questionnaire    Fear of Current or Ex-Partner: No    Emotionally Abused: No    Physically Abused: No    Sexually Abused: No    Review of Systems  Per hpi Objective:   Vitals:   09/28/23 1609  BP: 134/82  Pulse: (!) 55  Temp: 98 F (36.7 C)  TempSrc: Temporal  SpO2: 100%  Weight: 222 lb 12.8 oz (101.1 kg)  Height: 6\' 1"  (1.854 m)      Physical Exam Vitals reviewed.  Constitutional:      Appearance: He is well-developed.  HENT:     Head: Normocephalic and atraumatic.  Neck:     Vascular: No carotid bruit or JVD.  Cardiovascular:     Rate and Rhythm: Regular rhythm. Bradycardia present.     Heart sounds: Normal heart sounds. No murmur heard. Pulmonary:     Effort: Pulmonary effort is normal.     Breath sounds:  Normal breath sounds. No rales.  Musculoskeletal:     Right lower leg: No edema.     Left lower leg: No edema.  Skin:    General: Skin is warm and dry.     Comments: Cystic structure behind right ear, small amount of dried blood off-center, minimal fluctuance centrally.  No surrounding erythema or vascular streaks.  See photo  Neurological:     Mental Status: He is alert and oriented to person, place, and time.  Psychiatric:        Mood and Affect: Mood normal.         Assessment & Plan:  Nathan Martinez is a 77 y.o. male . Inflamed sebaceous cyst - Plan: doxycycline (VIBRA-TABS) 100 MG tablet  Essential hypertension - Plan: olmesartan (BENICAR) 40 MG tablet, amLODipine (NORVASC) 2.5 MG tablet   Meds ordered this encounter  Medications   olmesartan (BENICAR) 40 MG tablet    Sig: Take 1 tablet (40 mg total) by mouth daily.    Dispense:  90 tablet    Refill:  1   doxycycline (VIBRA-TABS) 100 MG tablet    Sig: Take 1 tablet (100 mg total) by mouth 2 (two) times daily.    Dispense:  20 tablet    Refill:  0   amLODipine (NORVASC) 2.5 MG tablet    Sig: Take 1 tablet (2.5 mg total) by mouth daily.    Dispense:  90 tablet    Refill:  1   Patient Instructions  Blood pressure better today. New meter, Keep a record of your blood pressures outside of the office. If persistently above 140/90 - add amlodipine (printed).   I sent the higher dose of olmesartan to your pharmacy, 40 mg/day.  Continue propranolol same dose for now.  If any worsening of tremor, let me know and we can try going to a  higher dose but would prefer the lower dose at this time.  Bump behind the right ear appears to be a possible infected sebaceous cyst or skin cyst.  Antibiotic doxycycline twice per day for now, warm compresses followed by gentle pressure to express pus or discharge.  If that area is not improving over the next 1 week, or any worsening during that time be seen or possible urgent care evaluation for procedure.  Recheck with me in 1 month, can perform lab work at that time.  Sooner if needed.  Take care.          Signed,   Meredith Staggers, MD [facility], [facility] 09/28/23 4:57 PM

## 2023-09-28 NOTE — Patient Instructions (Addendum)
Blood pressure better today. New meter, Keep a record of your blood pressures outside of the office. If persistently above 140/90 - add amlodipine (printed).   I sent the higher dose of olmesartan to your pharmacy, 40 mg/day.  Continue propranolol same dose for now.  If any worsening of tremor, let me know and we can try going to a higher dose but would prefer the lower dose at this time.  Bump behind the right ear appears to be a possible infected sebaceous cyst or skin cyst.  Antibiotic doxycycline twice per day for now, warm compresses followed by gentle pressure to express pus or discharge.  If that area is not improving over the next 1 week, or any worsening during that time be seen or possible urgent care evaluation for procedure.  Recheck with me in 1 month, can perform lab work at that time.  Sooner if needed.  Take care.

## 2023-09-29 ENCOUNTER — Encounter: Payer: Self-pay | Admitting: Family Medicine

## 2023-09-29 ENCOUNTER — Other Ambulatory Visit: Payer: Self-pay | Admitting: Hematology

## 2023-09-29 DIAGNOSIS — C9000 Multiple myeloma not having achieved remission: Secondary | ICD-10-CM

## 2023-10-01 ENCOUNTER — Telehealth: Payer: Self-pay | Admitting: Family Medicine

## 2023-10-01 ENCOUNTER — Telehealth: Payer: Self-pay | Admitting: Neurology

## 2023-10-01 DIAGNOSIS — G250 Essential tremor: Secondary | ICD-10-CM

## 2023-10-01 NOTE — Telephone Encounter (Signed)
Called pateint and let him know that he needed to call Dr. Haze Justin office and get them to prescribe it since they have changed the med they need to take it over. Left a message

## 2023-10-01 NOTE — Telephone Encounter (Addendum)
Caller name: Nathan Martinez  On DPR?: Yes  Call back number: [phone] (mobile)  Provider they see: Shade Flood, MD  Reason for call:  Pt was to decrease Propranolol to 10mg  BID however he states everytime he tries to cut them in half the pill crumbles. He wants to know if you could get him a prescription for the 10mg  instead so he can make sure he is getting the correct amount each time he takes it. Pt stated he will be traveling tomorrow 11/8 so if he doesn't answer he gave consent to leave a detailed voicemail per DPR.  Pharmacy: [facility] [address] [place] - [address]

## 2023-10-01 NOTE — Telephone Encounter (Signed)
Dr. Arbutus Leas this was from his visit with his PCP  should they be the one to start prescribing the propranolol? They reduced it and up the olmesartan. Patient just concerned about propranolol and tremors and hoping we would send in RX for 10 mg propranolol so that he doesn't half to cut them in half? He did say his B/P has still been running high I wasn't sure if you wanted patient to stay on this med ?

## 2023-10-01 NOTE — Telephone Encounter (Signed)
Caller states he would like to speak with nurse concerning his medication. States the medication Tat has him on is clashing with his other medications and making him sick.

## 2023-10-01 NOTE — Telephone Encounter (Signed)
From the visit with Dr. Neva Seat and recently noted elevated readings in the 170s to 180s over the 80s to 90s.  185/96 that morning.  Mild headache,  1 instance of dizziness getting out of the car day prior.  In office blood pressures were 148/88, 152/80, 156/78.  Home meter appeared to be reading high.  Was noted to have some bradycardia, decreased his propranolol to half a tab twice per day, olmesartan was increased to 40 mg daily.   Patient B/P is still high . He is taking 10 mg 2 times a day and would like a new RX for this so he doesn't have to cut them and they are crumbling

## 2023-10-01 NOTE — Telephone Encounter (Signed)
Error

## 2023-10-02 MED ORDER — PROPRANOLOL HCL 10 MG PO TABS
10.0000 mg | ORAL_TABLET | Freq: Two times a day (BID) | ORAL | 0 refills | Status: DC
Start: 2023-10-02 — End: 2023-12-31

## 2023-10-02 NOTE — Telephone Encounter (Signed)
Sent in the updated 10 mg Rx called pt and LM

## 2023-10-06 ENCOUNTER — Other Ambulatory Visit: Payer: Self-pay

## 2023-10-06 DIAGNOSIS — C9000 Multiple myeloma not having achieved remission: Secondary | ICD-10-CM

## 2023-10-07 ENCOUNTER — Inpatient Hospital Stay: Payer: Medicare Other | Attending: Hematology

## 2023-10-07 ENCOUNTER — Encounter: Payer: Self-pay | Admitting: Hematology

## 2023-10-07 ENCOUNTER — Other Ambulatory Visit: Payer: Self-pay | Admitting: Hematology

## 2023-10-07 ENCOUNTER — Inpatient Hospital Stay: Payer: Medicare Other

## 2023-10-07 ENCOUNTER — Inpatient Hospital Stay (HOSPITAL_BASED_OUTPATIENT_CLINIC_OR_DEPARTMENT_OTHER): Payer: Medicare Other | Admitting: Hematology

## 2023-10-07 VITALS — BP 146/95 | HR 63 | Temp 97.8°F | Resp 20 | Wt 224.9 lb

## 2023-10-07 DIAGNOSIS — Z5111 Encounter for antineoplastic chemotherapy: Secondary | ICD-10-CM | POA: Diagnosis not present

## 2023-10-07 DIAGNOSIS — C9001 Multiple myeloma in remission: Secondary | ICD-10-CM | POA: Diagnosis not present

## 2023-10-07 DIAGNOSIS — C9000 Multiple myeloma not having achieved remission: Secondary | ICD-10-CM | POA: Diagnosis present

## 2023-10-07 DIAGNOSIS — Z79899 Other long term (current) drug therapy: Secondary | ICD-10-CM | POA: Insufficient documentation

## 2023-10-07 LAB — CMP (CANCER CENTER ONLY)
ALT: 23 U/L (ref 0–44)
AST: 26 U/L (ref 15–41)
Albumin: 3.9 g/dL (ref 3.5–5.0)
Alkaline Phosphatase: 31 U/L — ABNORMAL LOW (ref 38–126)
Anion gap: 3 — ABNORMAL LOW (ref 5–15)
BUN: 17 mg/dL (ref 8–23)
CO2: 28 mmol/L (ref 22–32)
Calcium: 10.2 mg/dL (ref 8.9–10.3)
Chloride: 107 mmol/L (ref 98–111)
Creatinine: 1.02 mg/dL (ref 0.61–1.24)
GFR, Estimated: >60 mL/min (ref >60)
Glucose, Bld: 117 mg/dL — ABNORMAL HIGH (ref 70–99)
Potassium: 3.7 mmol/L (ref 3.5–5.1)
Sodium: 138 mmol/L (ref 135–145)
Total Bilirubin: 1 mg/dL (ref <1.2)
Total Protein: 6.7 g/dL (ref 6.5–8.1)

## 2023-10-07 LAB — CBC WITH DIFFERENTIAL (CANCER CENTER ONLY)
Abs Immature Granulocytes: 0.01 10*3/uL (ref 0.00–0.07)
Basophils Absolute: 0 10*3/uL (ref 0.0–0.1)
Basophils Relative: 1 %
Eosinophils Absolute: 0.2 10*3/uL (ref 0.0–0.5)
Eosinophils Relative: 5 %
HCT: 42.4 % (ref 39.0–52.0)
Hemoglobin: 14.2 g/dL (ref 13.0–17.0)
Immature Granulocytes: 0 %
Lymphocytes Relative: 27 %
Lymphs Abs: 0.9 10*3/uL (ref 0.7–4.0)
MCH: 27.6 pg (ref 26.0–34.0)
MCHC: 33.5 g/dL (ref 30.0–36.0)
MCV: 82.3 fL (ref 80.0–100.0)
Monocytes Absolute: 0.6 10*3/uL (ref 0.1–1.0)
Monocytes Relative: 17 %
Neutro Abs: 1.7 10*3/uL (ref 1.7–7.7)
Neutrophils Relative %: 50 %
Platelet Count: 125 10*3/uL — ABNORMAL LOW (ref 150–400)
RBC: 5.15 MIL/uL (ref 4.22–5.81)
RDW: 14.6 % (ref 11.5–15.5)
WBC Count: 3.3 10*3/uL — ABNORMAL LOW (ref 4.0–10.5)
nRBC: 0 % (ref 0.0–0.2)

## 2023-10-07 MED ORDER — ZOLEDRONIC ACID 4 MG/100ML IV SOLN
4.0000 mg | Freq: Once | INTRAVENOUS | Status: AC
Start: 2023-10-07 — End: 2023-10-07
  Administered 2023-10-07: 4 mg via INTRAVENOUS
  Filled 2023-10-07: qty 100

## 2023-10-07 MED ORDER — SODIUM CHLORIDE 0.9 % IV SOLN
Freq: Once | INTRAVENOUS | Status: AC
Start: 2023-10-07 — End: 2023-10-07

## 2023-10-07 NOTE — Patient Instructions (Signed)

## 2023-10-07 NOTE — Progress Notes (Signed)
Patient seen by Dr. Kale  Vitals are within treatment parameters.  Labs reviewed: and are within treatment parameters.  Per physician team, patient is ready for treatment and there are NO modifications to the treatment plan.  

## 2023-10-07 NOTE — Progress Notes (Signed)
[name]   HEMATOLOGY/ONCOLOGY CLINIC VISIT NOTE   Date of Service: 10/07/23  Patient Care Team: Shade Flood, MD as PCP - General (Family Medicine) [name], MD as Consulting Physician (Hematology)  CHIEF COMPLAINTS/PURPOSE OF CONSULTATION:  Follow-up for continued evaluation and management of multiple myeloma  HISTORY OF PRESENTING ILLNESS:  Please see previous note for details of initial presentation  INTERVAL HISTORY  Nathan Martinez is here for continued evaluation and management of multiple myeloma.  Patient was last seen by me on 04/07/2023 and reported well-controlled neuropathy and was doing well overall.  He was most recently seen by Thayil PA on 08/12/2023 and reported intermittent episodes of chronic lower back pain.  Today, he is accompanied by his wife. He reports that his blood pressure has recently been fluctuating.   He reports that he measured his BP one morning, and it was found to be high, triggering him to set up an appointment with his doctor. He reports that he usually measures his blood pressure in the mornings and notes that once he rechecks his BP  5 -10 minutes later, his reading is substantially lower the second time. Patient does use an upper arm monitor at home. His BP in clinic today is 146/95.   He is currently taking 40 MG Olmesartan daily. He is prescribed amlodipine as needed and has not needed to use it.   Patient has been tolerating his Revlimid well without any new or severe toxicities.   Patient denies any sleep deprivation or concerns for sleep apnea. He denies any abdominal pain and reports normal p.o. intake. He has no leg swelling at this time.   He is also taking 10 MG Propranolol twice a day, lowered from 20 MG previously. This dose does generally control his tremors.   Patient reports no concerns with bleeding while on Eliquis. His wife reports that he did accidentally cut himself on an occasion and did bleed for some time.    Patient reports that he felt great for a couple of days after taking steroids as part of his treatment.   He reports having a carbuncle bump in his right neck which is draining.   He does complains of some back pain which is not consistent. His symptoms typically presents with activity such as lifting  Patient continues to endorse neuropathy. He reports that he feels unsteady due to numbness in his feet. Patient denies any falls. He has no joint issues at this time.   He reports that he regularly takes magnesium oxide, multiple vitamins, and krill oil.   Patient reports that he is UTD with his flu, RSV, and COVID-19 vaccines.   MEDICAL HISTORY:  Intermittent hypercalcemia - primary hyperparathyroidism status post parathyroidectomy in 2019 HTN HLD Glaucoma History of Helicobacter pylori infection previously treated Chronic leukopenia/neutropenia History of present iron deficiency anemia Acute DVT  SURGICAL HISTORY: Rt TKA 2018 Parathyroidectomy 2019 B/l cataracts sx 2021  SOCIAL HISTORY: Social History   Socioeconomic History   Marital status: Married    Spouse name: Not on file   Number of children: Not on file   Years of education: Not on file   Highest education level: Bachelor's degree (e.g., BA, AB, BS)  Occupational History   Occupation: retired    Comment: Manufacturing systems engineer  Tobacco Use   Smoking status: Never   Smokeless tobacco: Never  Vaping Use   Vaping status: Never Used  Substance and Sexual Activity   Alcohol use: Not Currently   Drug  use: Never   Sexual activity: Not Currently    Birth control/protection: None  Other Topics Concern   Not on file  Social History Narrative   Right handed   retired   Chemical engineer Strain: Low Risk  (09/24/2023)   Overall Financial Resource Strain (CARDIA)    Difficulty of Paying Living Expenses: Not hard at all  Food Insecurity: No Food Insecurity (09/24/2023)   Hunger Vital  Sign    Worried About Running Out of Food in the Last Year: Never true    Ran Out of Food in the Last Year: Never true  Transportation Needs: No Transportation Needs (09/24/2023)   PRAPARE - Administrator, Civil Service (Medical): No    Lack of Transportation (Non-Medical): No  Physical Activity: Insufficiently Active (09/24/2023)   Exercise Vital Sign    Days of Exercise per Week: 2 days    Minutes of Exercise per Session: 30 min  Stress: No Stress Concern Present (09/24/2023)   [employer] of Occupational Health - Occupational Stress Questionnaire    Feeling of Stress : Not at all  Social Connections: Socially Integrated (09/24/2023)   Social Connection and Isolation Panel [NHANES]    Frequency of Communication with Friends and Family: More than three times a week    Frequency of Social Gatherings with Friends and Family: Three times a week    Attends Religious Services: More than 4 times per year    Active Member of Clubs or Organizations: No    Attends Banker Meetings: More than 4 times per year    Marital Status: Married  Catering manager Violence: Not At Risk (02/18/2023)   Humiliation, Afraid, Rape, and Kick questionnaire    Fear of Current or Ex-Partner: No    Emotionally Abused: No    Physically Abused: No    Sexually Abused: No  Ex smoker (quit 1976) Ex secret service agent ETOH social use  FAMILY HISTORY: Sister - uterine/ovarian cancer 41's  Mother-- lived into her 90's Father - died in 34's - fall/trauma  ALLERGIES:  has No Known Allergies.  MEDICATIONS:  Current Outpatient Medications on File Prior to Visit  Medication Sig Dispense Refill   amLODipine (NORVASC) 2.5 MG tablet Take 1 tablet (2.5 mg total) by mouth daily. 90 tablet 1   atorvastatin (LIPITOR) 20 MG tablet Take 1 tablet (20 mg total) by mouth at bedtime. 90 tablet 1   doxycycline (VIBRA-TABS) 100 MG tablet Take 1 tablet (100 mg total) by mouth 2 (two) times daily. 20  tablet 0   ELIQUIS 5 MG TABS tablet Take 1 tablet by mouth twice daily 60 tablet 0   ergocalciferol (VITAMIN D2) 1.25 MG (50000 UT) capsule Take 1 capsule (50,000 Units total) by mouth once a week. 30 capsule 0   FERREX 150 150 MG capsule Take 1 capsule by mouth once daily 90 capsule 0   gabapentin (NEURONTIN) 100 MG capsule Take 1 capsule (100 mg total) by mouth 2 (two) times daily as needed. 60 capsule 3   Krill Oil (OMEGA-3) 500 MG CAPS      latanoprost (XALATAN) 0.005 % ophthalmic solution SMARTSIG:1 Drop(s) In Eye(s) Every Evening     Magnesium Oxide 400 MG CAPS      Multiple Vitamins-Minerals (MULTIVITAMIN MEN 50+) TABS Take by mouth.     olmesartan (BENICAR) 40 MG tablet Take 1 tablet (40 mg total) by mouth daily. 90 tablet 1   propranolol (INDERAL) 10  MG tablet Take 1 tablet (10 mg total) by mouth 2 (two) times daily. 180 tablet 0   REVLIMID 10 MG capsule TAKE 1 CAPSULE BY MOUTH 1 TIME A DAY FOR 21 DAYS ON THEN 7 DAYS OFF 21 capsule 0   No current facility-administered medications on file prior to visit.     REVIEW OF SYSTEMS:    10 Point review of Systems was done is negative except as noted above.   PHYSICAL EXAMINATION: .BP (!) 146/95   Pulse 63   Temp 97.8 F (36.6 C)   Resp 20   Wt 224 lb 14.4 oz (102 kg)   SpO2 100%   BMI 29.67 kg/m    GENERAL:alert, in no acute distress and comfortable SKIN: no acute rashes, no significant lesions EYES: conjunctiva are pink and non-injected, sclera anicteric OROPHARYNX: MMM, no exudates, no oropharyngeal erythema or ulceration NECK: supple, no JVD LYMPH:  no palpable lymphadenopathy in the cervical, axillary or inguinal regions LUNGS: clear to auscultation b/l with normal respiratory effort HEART: regular rate & rhythm ABDOMEN:  normoactive bowel sounds , non tender, not distended. Extremity: no pedal edema PSYCH: alert & oriented x 3 with fluent speech NEURO: no focal motor/sensory deficits    LABORATORY DATA:  I have  reviewed the data as listed  .    Latest Ref Rng & Units 10/07/2023    2:12 PM 08/05/2023   12:44 PM 06/02/2023   12:28 PM  CBC  WBC 4.0 - 10.5 K/uL 3.3  2.8  2.6   Hemoglobin 13.0 - 17.0 g/dL 40.9  81.1  91.4   Hematocrit 39.0 - 52.0 % 42.4  42.6  42.0   Platelets 150 - 400 K/uL 125  132  124    CBC    Component Value Date/Time   WBC 2.8 (L) 08/05/2023 1244   WBC 5.1 08/11/2022 0807   RBC 5.10 08/05/2023 1244   HGB 14.0 08/05/2023 1244   HCT 42.6 08/05/2023 1244   PLT 132 (L) 08/05/2023 1244   MCV 83.5 08/05/2023 1244   MCH 27.5 08/05/2023 1244   MCHC 32.9 08/05/2023 1244   RDW 15.0 08/05/2023 1244   LYMPHSABS 0.7 08/05/2023 1244   MONOABS 0.5 08/05/2023 1244   EOSABS 0.1 08/05/2023 1244   BASOSABS 0.0 08/05/2023 1244    .    Latest Ref Rng & Units 10/07/2023    2:12 PM 08/05/2023   12:44 PM 06/02/2023   12:28 PM  CMP  Glucose 70 - 99 mg/dL 782  956  213   BUN 8 - 23 mg/dL 17  14  19    Creatinine 0.61 - 1.24 mg/dL 0.86  5.78  4.69   Sodium 135 - 145 mmol/L 138  140  140   Potassium 3.5 - 5.1 mmol/L 3.7  4.1  4.1   Chloride 98 - 111 mmol/L 107  109  108   CO2 22 - 32 mmol/L 28  28  28    Calcium 8.9 - 10.3 mg/dL 62.9  52.8  41.3   Total Protein 6.5 - 8.1 g/dL 6.7  6.4  6.6   Total Bilirubin <1.2 mg/dL 1.0  0.7  0.9   Alkaline Phos 38 - 126 U/L 31  28  27    AST 15 - 41 U/L 26  25  27    ALT 0 - 44 U/L 23  22  23       IEOP/Immunofixation Order: [id number] Component 9 d ago  PATHOLOGIST CHARGE ID  [id number] Marny Lowenstein  Insuasti-Beltran, M.D.   SERUM IEOP COMMENT   NO DEFINITE CLONALITY DETECTED.   Reviewed and Interpreted By   Ambrose Finland, M.D.       RADIOGRAPHIC STUDIES:   [place] lower extremity venous ([id number]) done 08/19/2022 revealed "Summary:  RIGHT:  - Findings consistent with acute deep vein thrombosis involving the right  proximal, mid, and distal femoral vein, right popliteal vein, and right  peroneal veins.  - No cystic structure found  in the popliteal fossa.     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 Most recent PET restaging scan done 08/13/2022 showed no overt signs of disease progression. Myeloma FISH panel no reported mutations. Cytogenetics-loss of Y chromosome.  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 Recent labs have shown resolution of his leukopenia/neutropenia.  We will continue to monitor on Revlimid.  3) h/o DVT of the right lower extremity Ultrasound right lower extremity 08/19/2022- Findings consistent with acute deep vein thrombosis involving the right  proximal, mid, and distal femoral vein, right popliteal vein, and right  peroneal veins.  Resolved and continues to be on anticoagulation.  4) history of stye -continue f/u with ophthamology Currently not an acute issue and has resolved since the patient has been off Velcade.  PLAN:  -Discussed lab results on 10/07/23 in detail with patient. CBC stable,showed WBC of 3.3K, hemoglobin of 14.2, and platelets of 125K. -mild thrombocytopenia and mild leukopenia likely from Revlimid -Last myeloma lab from 2 months ago showed that he was still in remission based on the absence of M protein.  -K/L light chains are stable -CMP stable Myeloma panel today with no M spike -Continue Eliquis 5 mg p.o. twice daily for continued protection while on Revlimid.  -educated patient that eliquis typically does not interact with Olmesartan  -educated patient  that dehydration can also cause increased blood pressure readings -recommend to drink at least 64 ounces of water daily -educated patient that BP medication is adjusted based on his baseline BP  -advised patient to limit salt in his diet and stay regularly active -Patient has been tolerating his Revlimid dosage well without any new or severe toxicities.   -continue maintenance Revlimid at 10 MG 3 weeks on, 1 week off -His underlying myeloma has generally been well-behaved. Discussed option to stop maintenance Revlimid after two years. Continue maintenance Revlimid at this time -will change zometa to every three months  -continue oral antibiotic for carbuncle in neck and recommend warm compresses to improve symptoms -continue B complex and vitamin D supplements -He is UTD with his age-appropriate vaccines including with flu, RSV, and COVID-19 vaccines.  -will continue to monitor with labs in 3 months -answered all of patient's and his wife's questions in detail  FOLLOW-UP: RTC with Dr Candise Che with labs in 3 months with next dose of Zometa  The total time spent in the appointment was 32 minutes* .  All of the patient's questions were answered with apparent satisfaction. The patient knows to call the clinic with any problems, questions or concerns.   Wyvonnia Lora MD MS AAHIVMS  [facility] Hematology/Oncology Physician [facility]  .*Total Encounter Time as defined by the Centers for Medicare and Medicaid Services includes, in addition to the face-to-face time of a patient visit (documented in the note above) non-face-to-face time: obtaining and reviewing outside history, ordering and reviewing medications, tests or procedures, care coordination (communications with other health care professionals or caregivers) and documentation in the medical record.    I,Mitra Faeizi,acting as a Neurosurgeon for Wyvonnia Lora, MD.,have documented all relevant documentation on the behalf of Wyvonnia Lora,  MD,as directed by  Wyvonnia Lora, MD while in the presence of Wyvonnia Lora, MD.  .I have reviewed the above documentation for accuracy and completeness, and I agree with the above. [name] MD

## 2023-10-08 LAB — KAPPA/LAMBDA LIGHT CHAINS
Kappa free light chain: 28.1 mg/L — ABNORMAL HIGH (ref 3.3–19.4)
Kappa, lambda light chain ratio: 1.97 — ABNORMAL HIGH (ref 0.26–1.65)
Lambda free light chains: 14.3 mg/L (ref 5.7–26.3)

## 2023-10-12 LAB — MULTIPLE MYELOMA PANEL, SERUM
Albumin SerPl Elph-Mcnc: 3.8 g/dL (ref 2.9–4.4)
Albumin/Glob SerPl: 1.6 (ref 0.7–1.7)
Alpha 1: 0.1 g/dL (ref 0.0–0.4)
Alpha2 Glob SerPl Elph-Mcnc: 0.5 g/dL (ref 0.4–1.0)
B-Globulin SerPl Elph-Mcnc: 0.7 g/dL (ref 0.7–1.3)
Gamma Glob SerPl Elph-Mcnc: 1.1 g/dL (ref 0.4–1.8)
Globulin, Total: 2.4 g/dL (ref 2.2–3.9)
IgA: 147 mg/dL (ref 61–437)
IgG (Immunoglobin G), Serum: 1383 mg/dL (ref 603–1613)
IgM (Immunoglobulin M), Srm: 24 mg/dL (ref 15–143)
Total Protein ELP: 6.2 g/dL (ref 6.0–8.5)

## 2023-10-13 ENCOUNTER — Encounter: Payer: Self-pay | Admitting: Hematology

## 2023-10-17 ENCOUNTER — Other Ambulatory Visit: Payer: Self-pay | Admitting: Oncology

## 2023-10-17 DIAGNOSIS — D472 Monoclonal gammopathy: Secondary | ICD-10-CM

## 2023-10-19 ENCOUNTER — Other Ambulatory Visit: Payer: Self-pay | Admitting: Family Medicine

## 2023-10-19 DIAGNOSIS — D472 Monoclonal gammopathy: Secondary | ICD-10-CM

## 2023-10-19 MED ORDER — POLYSACCHARIDE IRON COMPLEX 150 MG PO CAPS: 150.0000 mg | ORAL_CAPSULE | Freq: Every day | ORAL | 0 refills | Status: DC

## 2023-10-19 NOTE — Telephone Encounter (Signed)
Encourage patient to contact the pharmacy for refills or they can request refills through [facility]   WHAT PHARMACY WOULD THEY LIKE THIS SENT TO:  [id number] - [place], [place] - [address]   MEDICATION NAME & DOSE: FERREX 150 150 MG capsule   NOTES/COMMENTS FROM PATIENT:      Front office please notify patient: It takes 48-72 hours to process rx refill requests Ask patient to call pharmacy to ensure rx is ready before heading there.

## 2023-10-19 NOTE — Telephone Encounter (Signed)
Requested Prescriptions   Pending Prescriptions Disp Refills   iron polysaccharides (FERREX 150) 150 MG capsule 90 capsule 0    Sig: Take 1 capsule (150 mg total) by mouth daily.     Date of patient request: 10/19/23 09/28/2023 10/29/2023 Date of last refill: 07/22/23 Last refill amount: 90

## 2023-10-29 ENCOUNTER — Ambulatory Visit: Payer: Medicare Other | Admitting: Family Medicine

## 2023-10-30 ENCOUNTER — Other Ambulatory Visit: Payer: Self-pay | Admitting: Family Medicine

## 2023-10-30 DIAGNOSIS — G620 Drug-induced polyneuropathy: Secondary | ICD-10-CM

## 2023-10-31 ENCOUNTER — Other Ambulatory Visit: Payer: Self-pay | Admitting: Hematology

## 2023-10-31 DIAGNOSIS — C9000 Multiple myeloma not having achieved remission: Secondary | ICD-10-CM

## 2023-11-05 ENCOUNTER — Other Ambulatory Visit: Payer: Self-pay | Admitting: Hematology

## 2023-11-05 DIAGNOSIS — C9000 Multiple myeloma not having achieved remission: Secondary | ICD-10-CM

## 2023-11-06 ENCOUNTER — Encounter: Payer: Self-pay | Admitting: Hematology

## 2023-11-12 ENCOUNTER — Ambulatory Visit: Payer: Medicare Other | Admitting: Family Medicine

## 2023-11-12 ENCOUNTER — Encounter: Payer: Self-pay | Admitting: Family Medicine

## 2023-11-12 ENCOUNTER — Encounter: Payer: Self-pay | Admitting: Neurology

## 2023-11-12 VITALS — BP 142/76 | HR 59 | Temp 98.6°F | Ht 73.0 in | Wt 224.0 lb

## 2023-11-12 DIAGNOSIS — I10 Essential (primary) hypertension: Secondary | ICD-10-CM

## 2023-11-12 DIAGNOSIS — E785 Hyperlipidemia, unspecified: Secondary | ICD-10-CM

## 2023-11-12 DIAGNOSIS — G620 Drug-induced polyneuropathy: Secondary | ICD-10-CM

## 2023-11-12 DIAGNOSIS — L723 Sebaceous cyst: Secondary | ICD-10-CM

## 2023-11-12 LAB — COMPREHENSIVE METABOLIC PANEL
ALT: 26 U/L (ref 0–53)
AST: 27 U/L (ref 0–37)
Albumin: 4 g/dL (ref 3.5–5.2)
Alkaline Phosphatase: 30 U/L — ABNORMAL LOW (ref 39–117)
BUN: 13 mg/dL (ref 6–23)
CO2: 30 meq/L (ref 19–32)
Calcium: 10.1 mg/dL (ref 8.4–10.5)
Chloride: 103 meq/L (ref 96–112)
Creatinine, Ser: 1.11 mg/dL (ref 0.40–1.50)
GFR: 63.88 mL/min (ref >60.00)
Glucose, Bld: 80 mg/dL (ref 70–99)
Potassium: 4.1 meq/L (ref 3.5–5.1)
Sodium: 137 meq/L (ref 135–145)
Total Bilirubin: 1.4 mg/dL — ABNORMAL HIGH (ref 0.2–1.2)
Total Protein: 6.8 g/dL (ref 6.0–8.3)

## 2023-11-12 LAB — LIPID PANEL
Cholesterol: 134 mg/dL (ref 0–200)
HDL: 50 mg/dL (ref >39.00)
LDL Cholesterol: 75 mg/dL (ref 0–99)
NonHDL: 84.45
Total CHOL/HDL Ratio: 3
Triglycerides: 49 mg/dL (ref 0.0–149.0)
VLDL: 9.8 mg/dL (ref 0.0–40.0)

## 2023-11-12 MED ORDER — GABAPENTIN 100 MG PO CAPS: 100.0000 mg | ORAL_CAPSULE | Freq: Two times a day (BID) | ORAL | 3 refills | Status: DC | PRN

## 2023-11-12 MED ORDER — ATORVASTATIN CALCIUM 20 MG PO TABS: 20.0000 mg | ORAL_TABLET | Freq: Every day | ORAL | 1 refills | Status: DC

## 2023-11-12 NOTE — Progress Notes (Signed)
Assessment/Plan:    1.  Essential Tremor  Continue propranolol 10 mg bid.  Higher dosages with rated cardia, although bradycardia.  About the same today as previously.  Patient does not think tremor control is as good  -cannot be on primidone due to eliquis  2.  MM  -following with hematology  3.  Chemo induced PN  -Increase gabapentin, 300 mg in the AM and 100 mg at night x 1 week.  If he does well, he can stay there.  If not, we can go to 300 mg bid.  He is to call or email me and let me know how he did.  -Would like to see his B12 over 400.  He was just on the borderline about 6 months ago.  Will recheck.  Will also recheck hemoglobin A1c.  Last checked about a year and a half ago and was 6.1.  Patient is still actively on chemotherapy.  Subjective:   Nathan Martinez was seen today in follow up for essential tremor.  My previous records were reviewed prior to todays visit.  Notes from primary care reviewed.  They had to drop the patient's propranolol to half a tablet twice per day because of bradycardia.  His blood pressure then increased and they have added olmesartan.  He does not think tremor control is quite as good.  Patient was at his primary care yesterday regarding blood pressure and a sebaceous cyst, but also neuropathy.  Patient was worked into discussed the neuropathy.  Patient has had chemotherapy-induced neuropathy.  He has been on gabapentin 100 mg twice per day.  He is finding symptoms are more bothersome.  Sx's are up to the knees.  He feels burning and tingling.  Sx's are mostly in the AM and sometimes in the day.  He does well at night with sleeping.  Yesterday, that he was told to increase the gabapentin to 200 mg in the morning.  Current prescribed movement disorder medications: Propranolol, 20 mg, half tablet twice per day   PREVIOUS MEDICATIONS: Cannot be on primidone because of Eliquis  ALLERGIES:  No Known Allergies  CURRENT MEDICATIONS:  Current Meds   Medication Sig   amLODipine (NORVASC) 2.5 MG tablet Take 1 tablet (2.5 mg total) by mouth daily.   atorvastatin (LIPITOR) 20 MG tablet Take 1 tablet (20 mg total) by mouth at bedtime.   ELIQUIS 5 MG TABS tablet Take 1 tablet by mouth twice daily   ergocalciferol (VITAMIN D2) 1.25 MG (50000 UT) capsule Take 1 capsule (50,000 Units total) by mouth once a week.   gabapentin (NEURONTIN) 300 MG capsule Take 1 capsule (300 mg total) by mouth 2 (two) times daily.   iron polysaccharides (FERREX 150) 150 MG capsule Take 1 capsule (150 mg total) by mouth daily.   Krill Oil (OMEGA-3) 500 MG CAPS    latanoprost (XALATAN) 0.005 % ophthalmic solution SMARTSIG:1 Drop(s) In Eye(s) Every Evening   Magnesium Oxide 400 MG CAPS    Multiple Vitamins-Minerals (MULTIVITAMIN MEN 50+) TABS Take by mouth.   olmesartan (BENICAR) 40 MG tablet Take 1 tablet (40 mg total) by mouth daily.   propranolol (INDERAL) 10 MG tablet Take 1 tablet (10 mg total) by mouth 2 (two) times daily.   REVLIMID 10 MG capsule TAKE 1 CAPSULE BY MOUTH 1 TIME A DAY FOR 21 DAYS ON THEN 7 DAYS OFF   [DISCONTINUED] gabapentin (NEURONTIN) 100 MG capsule Take 1-2 capsules (100-200 mg total) by mouth 2 (two) times daily  as needed. (Patient taking differently: Take 100-200 mg by mouth 2 (two) times daily as needed. Two tablet in the morning and one tablet in the evening.)      Objective:    PHYSICAL EXAMINATION:    VITALS:   Vitals:   11/13/23 1110  BP: 132/74  Pulse: (!) 58  SpO2: 97%  Weight: 226 lb (102.5 kg)  Height: 6\' 1"  (1.854 m)     GEN:  The patient appears stated age and is in NAD. HEENT:  Normocephalic, atraumatic.  The mucous membranes are moist. The superficial temporal arteries are without ropiness or tenderness. CV:  RRR Lungs:  CTAB Neck/HEME:  There are no carotid bruits bilaterally.  Neurological examination:  Orientation: The patient is alert and oriented x3. Cranial nerves: There is good facial symmetry. The  speech is fluent and clear. Soft palate rises symmetrically and there is no tongue deviation. Hearing is intact to conversational tone. Sensation: Sensation is intact to light touch throughout Motor: Strength is at least antigravity x4.  Movement examination: Tone: There is normal tone in the UE/LE Abnormal movements: mild postural tremor and intention tremor bilaterally.   Gait and Station: The patient has no difficulty arising out of a deep-seated chair without the use of the hands. The patient's stride length is good I have reviewed and interpreted the following labs independently   Chemistry      Component Value Date/Time   NA 137 11/12/2023 1147   K 4.1 11/12/2023 1147   CL 103 11/12/2023 1147   CO2 30 11/12/2023 1147   BUN 13 11/12/2023 1147   CREATININE 1.11 11/12/2023 1147   CREATININE 1.02 10/07/2023 1412      Component Value Date/Time   CALCIUM 10.1 11/12/2023 1147   CALCIUM 10.7 (H) 12/12/2021 1225   ALKPHOS 30 (L) 11/12/2023 1147   AST 27 11/12/2023 1147   AST 26 10/07/2023 1412   ALT 26 11/12/2023 1147   ALT 23 10/07/2023 1412   BILITOT 1.4 (H) 11/12/2023 1147   BILITOT 1.0 10/07/2023 1412      Lab Results  Component Value Date   WBC 3.3 (L) 10/07/2023   HGB 14.2 10/07/2023   HCT 42.4 10/07/2023   MCV 82.3 10/07/2023   PLT 125 (L) 10/07/2023   Lab Results  Component Value Date   TSH 1.295 12/12/2021     Chemistry      Component Value Date/Time   NA 137 11/12/2023 1147   K 4.1 11/12/2023 1147   CL 103 11/12/2023 1147   CO2 30 11/12/2023 1147   BUN 13 11/12/2023 1147   CREATININE 1.11 11/12/2023 1147   CREATININE 1.02 10/07/2023 1412      Component Value Date/Time   CALCIUM 10.1 11/12/2023 1147   CALCIUM 10.7 (H) 12/12/2021 1225   ALKPHOS 30 (L) 11/12/2023 1147   AST 27 11/12/2023 1147   AST 26 10/07/2023 1412   ALT 26 11/12/2023 1147   ALT 23 10/07/2023 1412   BILITOT 1.4 (H) 11/12/2023 1147   BILITOT 1.0 10/07/2023 1412     Lab Results   Component Value Date   VITAMINB12 399 05/13/2023   Lab Results  Component Value Date   HGBA1C 6.1 02/06/2022   Total time spent on today's visit was 31 minutes, including both face-to-face time and nonface-to-face time.  Time included that spent on review of records (prior notes available to me/labs/imaging if pertinent), discussing treatment and goals, answering patient's questions and coordinating care.   Cc:  Neva Seat,  Asencion Partridge, MD

## 2023-11-12 NOTE — Patient Instructions (Addendum)
Recent blood pressures look better. If readings start to remain in the 140's, then increase amlodipine to 5mg  (2 of the 2.5mg ) daily and let me know if you make that change. If that works better and I can send in the 5mg  dose. If side effects, low readings, lightheadedness or dizziness - return to 2.5mg  per day.   Try increasing the gabapentin to 2 pills in the morning over the next week and to see if that helps with some of the daytime neuropathy symptoms.  If you have more nighttime symptoms you can try to increase to 2 pills at night as well.  I did place a referral back to neurology to discuss the symptoms further but we can certainly adjust the gabapentin dose to help treat the symptoms in the meantime.   I will refer you to dermatology but I do not see any infection in the sebaceous cysts at this time.  If any concerns on labs I will let you know.  No medication changes at this time.  Take care!

## 2023-11-12 NOTE — Progress Notes (Signed)
Subjective:  Patient ID: Nathan Martinez, male    DOB: 11-29-1945  Age: 77 y.o. MRN: [id number]  CC:  Chief Complaint  Patient presents with   Medical Management of Chronic Issues    6 month check in notes doing well, pt brought some readings 150/80, 153/85, 131/75, 148/89, 128/76, 136/78   Mass    Pt would like to have the lump on his neck and chest rechecked, discussed last month during acute visit    Peripheral Neuropathy    Pt notes he doesn't feel relief much with current treatment and would like to consider a specialist     HPI Nathan Martinez presents for  Concerns above.  Infected/inflamed sebaceous cyst Discussed last visit, minimal fluctuance but small opening was noted and had drained on its own.  Warm compresses with gentle pressure recommended to continue to express exudate and was started on doxycycline. Improved. Still hard bump. No recent drainage.  No derm.  Bump on chest for awhile - years. More sore recently. No drainage.  No treatments.   Hypertension: Last seen November.  Elevated blood pressures previously, seen by the provider in October, propranolol was decreased to half a tab twice per day, given bradycardia and olmesartan was increased to 40 mg daily.  Blood pressure was improving at that time with higher dose option to add 2.5 mg amlodipine if persistent elevations. Has been taking amlodipine daily - no side effects.   Home readings: Variable as above. Running higher most of the time. 130-140's. Lowest 115/69. Recently 115-139/67-82.  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  Noticed after Velcade injections.  Followed by hematology with history of multiple myeloma.  He had been started on gabapentin in October of last year with 100 mg twice daily.  Some residual symptoms when discussed  in June.  Referred to neurology at that time. He has been followed by Dr. Arbutus Leas for essential tremor, but no referral notes it appears that appointment with Dr. Loleta Chance or Dr. Allena Katz was recommended for neuropathy symptoms. Persistent tingling - moving up legs.  Notices more during the day.   Hyperlipidemia: Lipitor 20mg  every day. No myalgias, side effects Lab Results  Component Value Date   CHOL 140 05/13/2023   HDL 51.20 05/13/2023   LDLCALC 73 05/13/2023   TRIG 80.0 05/13/2023   CHOLHDL 3 05/13/2023   Lab Results  Component Value Date   ALT 23 10/07/2023   AST 26 10/07/2023   ALKPHOS 31 (L) 10/07/2023   BILITOT 1.0 10/07/2023        History Patient Active Problem List   Diagnosis Date Noted   Essential hypertension 09/22/2023   Multiple myeloma not having achieved remission (HCC) 04/16/2022   Multiple myeloma (HCC) 01/24/2022   Counseling regarding advance care planning and goals of care 01/24/2022   Past Medical History:  Diagnosis Date   DVT (deep vein thrombosis) in pregnancy    Hyperlipidemia    Hypertension    Multiple myeloma (HCC)    Peripheral neuropathy    Past Surgical History:  Procedure Laterality Date   CATARACT EXTRACTION, BILATERAL     KNEE SURGERY Right 2018   PARATHYROID EXPLORATION     No Known Allergies Prior to Admission medications   Medication Sig Start Date End Date Taking? Authorizing Provider  amLODipine (NORVASC)  2.5 MG tablet Take 1 tablet (2.5 mg total) by mouth daily. 09/28/23  Yes Shade Flood, MD  atorvastatin (LIPITOR) 20 MG tablet Take 1 tablet (20 mg total) by mouth at bedtime. 11/10/22  Yes Shade Flood, MD  ELIQUIS 5 MG TABS tablet Take 1 tablet by mouth twice daily 10/31/23  Yes [name], MD  ergocalciferol (VITAMIN D2) 1.25 MG (50000 UT) capsule Take 1 capsule (50,000 Units total) by mouth once a week. 11/10/22  Yes Shade Flood, MD  gabapentin (NEURONTIN) 100 MG capsule Take 1 capsule (100 mg total)  by mouth 2 (two) times daily as needed. 07/07/23  Yes Shade Flood, MD  iron polysaccharides (FERREX 150) 150 MG capsule Take 1 capsule (150 mg total) by mouth daily. 10/19/23  Yes Shade Flood, MD  Krill Oil (OMEGA-3) 500 MG CAPS  09/30/16  Yes [provider]  latanoprost (XALATAN) 0.005 % ophthalmic solution SMARTSIG:1 Drop(s) In Eye(s) Every Evening 01/28/22  Yes [provider]  Magnesium Oxide 400 MG CAPS    Yes [provider]  Multiple Vitamins-Minerals (MULTIVITAMIN MEN 50+) TABS Take by mouth.   Yes [provider]  olmesartan (BENICAR) 40 MG tablet Take 1 tablet (40 mg total) by mouth daily. 09/28/23  Yes Shade Flood, MD  propranolol (INDERAL) 10 MG tablet Take 1 tablet (10 mg total) by mouth 2 (two) times daily. 10/02/23  Yes Shade Flood, MD  REVLIMID 10 MG capsule TAKE 1 CAPSULE BY MOUTH 1 TIME A DAY FOR 21 DAYS ON THEN 7 DAYS OFF 11/06/23  Yes [name], MD   Social History   Socioeconomic History   Marital status: Married    Spouse name: Not on file   Number of children: Not on file   Years of education: Not on file   Highest education level: Bachelor's degree (e.g., BA, AB, BS)  Occupational History   Occupation: retired    Comment: Manufacturing systems engineer  Tobacco Use   Smoking status: Never   Smokeless tobacco: Never  Vaping Use   Vaping status: Never Used  Substance and Sexual Activity   Alcohol use: Not Currently   Drug use: Never   Sexual activity: Not Currently    Birth control/protection: None  Other Topics Concern   Not on file  Social History Narrative   Right handed   retired   Chief Executive Officer Drivers of Corporate investment banker Strain: Low Risk  (11/11/2023)   Overall Financial Resource Strain (CARDIA)    Difficulty of Paying Living Expenses: Not hard at all  Food Insecurity: No Food Insecurity (11/11/2023)   Hunger Vital Sign    Worried About Running Out of Food in the Last Year: Never true    Ran  Out of Food in the Last Year: Never true  Transportation Needs: No Transportation Needs (11/11/2023)   PRAPARE - Administrator, Civil Service (Medical): No    Lack of Transportation (Non-Medical): No  Physical Activity: Insufficiently Active (11/11/2023)   Exercise Vital Sign    Days of Exercise per Week: 2 days    Minutes of Exercise per Session: 60 min  Stress: No Stress Concern Present (11/11/2023)   [employer] of Occupational Health - Occupational Stress Questionnaire    Feeling of Stress : Not at all  Social Connections: Socially Integrated (11/11/2023)   Social Connection and Isolation Panel [NHANES]    Frequency of Communication with Friends and Family: Twice a week  Frequency of Social Gatherings with Friends and Family: Once a week    Attends Religious Services: More than 4 times per year    Active Member of [employer] or Organizations: No    Attends Engineer, structural: More than 4 times per year    Marital Status: Married  Catering manager Violence: Not At Risk (02/18/2023)   Humiliation, Afraid, Rape, and Kick questionnaire    Fear of Current or Ex-Partner: No    Emotionally Abused: No    Physically Abused: No    Sexually Abused: No    Review of Systems  Constitutional:  Negative for fatigue and unexpected weight change.  Eyes:  Negative for visual disturbance.  Respiratory:  Negative for cough, chest tightness and shortness of breath.   Cardiovascular:  Negative for chest pain, palpitations and leg swelling.  Gastrointestinal:  Negative for abdominal pain and blood in stool.  Neurological:  Negative for dizziness, light-headedness and headaches.     Objective:   Vitals:   11/12/23 1035 11/12/23 1130  BP: (!) 144/78 (!) 142/76  Pulse: (!) 59   Temp: 98.6 F (37 C)   TempSrc: Temporal   SpO2: 96%   Weight: 224 lb (101.6 kg)   Height: 6\' 1"  (1.854 m)      Physical Exam Vitals reviewed.  Constitutional:      Appearance: He is  well-developed.  HENT:     Head: Normocephalic and atraumatic.  Neck:     Vascular: No carotid bruit or JVD.  Cardiovascular:     Rate and Rhythm: Normal rate and regular rhythm.     Heart sounds: Normal heart sounds. No murmur heard. Pulmonary:     Effort: Pulmonary effort is normal.     Breath sounds: Normal breath sounds. No rales.  Musculoskeletal:     Right lower leg: No edema.     Left lower leg: No edema.  Skin:    General: Skin is warm and dry.     Comments: Small sebaceous appearing cyst behind right ear and right chest wall without apparent signs of infection, surrounding erythema or fluctuance.  No active discharge.  Neurological:     Mental Status: He is alert and oriented to person, place, and time.  Psychiatric:        Mood and Affect: Mood normal.          Assessment & Plan:  Nathan Martinez is a 77 y.o. male . Inflamed sebaceous cyst - Plan: Ambulatory referral to Dermatology  -Prior inflamed cyst appears improved.  Still cyst present, as well as on upper chest wall without signs of infection, refer to dermatology to evaluate treatment options.  Hyperlipidemia, unspecified hyperlipidemia type - Plan: atorvastatin (LIPITOR) 20 MG tablet, Comprehensive metabolic panel, Lipid panel  -Tolerating current dose Lipitor, check labs, adjust plan accordingly  Essential hypertension - Plan: Comprehensive metabolic panel  -Variable readings but recently has had lower readings.  Option of increasing his amlodipine to 5 mg daily but given recent readings I am hesitant to make changes today.  If he has persistent elevated readings at home in the 140s, advised to double up on his 2.5 mg amlodipine and advise me if he makes that change and if it is tolerated.  RTC precautions given.  No other med changes for now for hypertension  Drug-induced polyneuropathy (HCC) - Plan: Ambulatory referral to Neurology  -Repeat referral to neurology placed, trial of higher dose of gabapentin  the more initially at 200 mg, then after  1 week can increase nighttime dose if needed.  Advised to let me know if further changes needed prior to neuro eval.  Meds ordered this encounter  Medications   atorvastatin (LIPITOR) 20 MG tablet    Sig: Take 1 tablet (20 mg total) by mouth at bedtime.    Dispense:  90 tablet    Refill:  1   Patient Instructions  Recent blood pressures look better. If readings start to remain in the 140's, then increase amlodipine to 5mg  (2 of the 2.5mg ) daily and let me know if you make that change. If that works better and I can send in the 5mg  dose. If side effects, low readings, lightheadedness or dizziness - return to 2.5mg  per day.  Try increasing the gabapentin to 2 pills in the morning over the next week and to see if that helps with some of the daytime neuropathy symptoms.  If you have more nighttime symptoms you can try to increase to 2 pills at night as well.  I did place a referral back to neurology to discuss the symptoms further but we can certainly adjust the gabapentin dose to help treat the symptoms in the meantime.       Signed,   Meredith Staggers, MD [facility], [facility] 11/12/23 11:40 AM

## 2023-11-13 ENCOUNTER — Encounter: Payer: Self-pay | Admitting: Neurology

## 2023-11-13 ENCOUNTER — Other Ambulatory Visit: Payer: Medicare Other

## 2023-11-13 ENCOUNTER — Ambulatory Visit (INDEPENDENT_AMBULATORY_CARE_PROVIDER_SITE_OTHER): Payer: Medicare Other | Admitting: Neurology

## 2023-11-13 VITALS — BP 132/74 | HR 58 | Ht 73.0 in | Wt 226.0 lb

## 2023-11-13 DIAGNOSIS — G250 Essential tremor: Secondary | ICD-10-CM

## 2023-11-13 DIAGNOSIS — E538 Deficiency of other specified B group vitamins: Secondary | ICD-10-CM | POA: Diagnosis not present

## 2023-11-13 DIAGNOSIS — T451X5A Adverse effect of antineoplastic and immunosuppressive drugs, initial encounter: Secondary | ICD-10-CM

## 2023-11-13 DIAGNOSIS — G620 Drug-induced polyneuropathy: Secondary | ICD-10-CM

## 2023-11-13 DIAGNOSIS — R739 Hyperglycemia, unspecified: Secondary | ICD-10-CM

## 2023-11-13 MED ORDER — GABAPENTIN 300 MG PO CAPS: 300.0000 mg | ORAL_CAPSULE | Freq: Two times a day (BID) | ORAL | 1 refills | Status: DC

## 2023-11-13 NOTE — Patient Instructions (Signed)
Your provider has requested that you have labwork completed today. The lab is located on the Second floor at [address], within the [facility] office. When you get off the elevator, turn right and go in the [facility] [address]; the first brown door on the left.  Tell the ladies behind the desk that you are there for lab work. If you are not called within 15 minutes please check with the front desk.   Once you complete your labs you are free to go. You will receive a call or message via MyChart with your lab results.     Increase your gabapentin to 300 mg in the AM and 100 mg in the evening for 1 week and find out how your tremor and neuropathy symptoms are.  If you are doing okay we will stay at that dosage.  If not, increase to 300 mg twice per day.  Let me know how you did!  The physicians and staff at [facility] are committed to providing excellent care. You may receive a survey requesting feedback about your experience at our office. We strive to receive "very good" responses to the survey questions. If you feel that your experience would prevent you from giving the office a "very good " response, please contact our office to try to remedy the situation. We may be reached at [phone]. Thank you for taking the time out of your busy day to complete the survey.

## 2023-11-14 LAB — HEMOGLOBIN A1C
Hgb A1c MFr Bld: 5.7 %{Hb} — ABNORMAL HIGH (ref <5.7)
Mean Plasma Glucose: 117 mg/dL
eAG (mmol/L): 6.5 mmol/L

## 2023-11-16 ENCOUNTER — Other Ambulatory Visit: Payer: Medicare Other

## 2023-11-16 ENCOUNTER — Telehealth: Payer: Self-pay

## 2023-11-16 NOTE — Telephone Encounter (Signed)
-----   Message from Shade Flood sent at 11/15/2023  3:03 PM EST ----- Results sent by MyChart, please schedule lab visit for repeat to bilirubin diagnosis of hyperbilirubinemia.  Lab visit at our office or [facility] is fine.

## 2023-11-16 NOTE — Telephone Encounter (Signed)
Patient has received lab results, called to scheduled lab only visit but no answer when I called, pt needs lab only visit asap  Labs ordered

## 2023-11-17 ENCOUNTER — Other Ambulatory Visit: Payer: Self-pay | Admitting: Family Medicine

## 2023-11-17 ENCOUNTER — Other Ambulatory Visit: Payer: Medicare Other

## 2023-11-17 DIAGNOSIS — I10 Essential (primary) hypertension: Secondary | ICD-10-CM

## 2023-11-23 ENCOUNTER — Telehealth: Payer: Self-pay | Admitting: Pharmacy Technician

## 2023-11-23 NOTE — Telephone Encounter (Signed)
Oral Oncology Patient Advocate Encounter   Received notification that prior authorization for lenalidomide is due for renewal.   PA submitted on 11/23/23 Submitted via e-fax [phone] Status is pending     Jinger Neighbors, CPhT-Adv Oncology Pharmacy Patient Advocate [facility] Direct Number: [phone]  Fax: [phone] '

## 2023-11-23 NOTE — Telephone Encounter (Signed)
Oral Oncology Patient Advocate Encounter  Prior Authorization for lenalidomide has been approved.    Effective dates: 10/24/23 through 11/22/24  Patient will continue to fill at [facility].    Jinger Neighbors, CPhT-Adv Oncology Pharmacy Patient Advocate [facility] Direct Number: [phone]  Fax: [phone]

## 2023-11-27 ENCOUNTER — Other Ambulatory Visit: Payer: Self-pay | Admitting: Hematology

## 2023-11-27 DIAGNOSIS — C9000 Multiple myeloma not having achieved remission: Secondary | ICD-10-CM

## 2023-12-01 ENCOUNTER — Other Ambulatory Visit: Payer: Self-pay | Admitting: Hematology

## 2023-12-01 ENCOUNTER — Encounter: Payer: Self-pay | Admitting: Hematology

## 2023-12-01 DIAGNOSIS — C9000 Multiple myeloma not having achieved remission: Secondary | ICD-10-CM

## 2023-12-02 ENCOUNTER — Encounter: Payer: Self-pay | Admitting: Hematology

## 2023-12-08 ENCOUNTER — Other Ambulatory Visit: Payer: Self-pay

## 2023-12-08 DIAGNOSIS — I10 Essential (primary) hypertension: Secondary | ICD-10-CM

## 2023-12-08 MED ORDER — AMLODIPINE BESYLATE 5 MG PO TABS: 5.0000 mg | ORAL_TABLET | Freq: Every day | ORAL | 1 refills | Status: DC

## 2023-12-08 NOTE — Telephone Encounter (Signed)
 Pended 5 mg dose below please advise if this an acceptable change and I am happy to send and inform patient!

## 2023-12-08 NOTE — Telephone Encounter (Signed)
 Copied from CRM [id number]. Topic: Clinical - Medication Question >> Dec 08, 2023  8:41 AM Nathan Martinez wrote: Reason for CRM: Patient would like to inform Dr. Landy blood pressure had not improved and started taking 5 MG of amlodipine  instead of 2.5 MG per Dr. Valorie suggestion. Patient states that taking the 5 MG has helped bring his blood pressure into better range and is requesting that a prescription for the 5 MG amlodipine  be sent to his preferred pharmacy ([facility] [place].)

## 2023-12-08 NOTE — Telephone Encounter (Signed)
 Agree with plan.  5 mg dose ordered.

## 2023-12-26 ENCOUNTER — Other Ambulatory Visit: Payer: Self-pay | Admitting: Hematology

## 2023-12-26 DIAGNOSIS — C9000 Multiple myeloma not having achieved remission: Secondary | ICD-10-CM

## 2023-12-28 ENCOUNTER — Encounter: Payer: Self-pay | Admitting: Hematology

## 2023-12-30 ENCOUNTER — Other Ambulatory Visit: Payer: Self-pay | Admitting: Hematology

## 2023-12-30 DIAGNOSIS — C9000 Multiple myeloma not having achieved remission: Secondary | ICD-10-CM

## 2023-12-31 ENCOUNTER — Other Ambulatory Visit: Payer: Self-pay | Admitting: Family Medicine

## 2023-12-31 DIAGNOSIS — G250 Essential tremor: Secondary | ICD-10-CM

## 2024-01-05 ENCOUNTER — Other Ambulatory Visit: Payer: Self-pay

## 2024-01-05 DIAGNOSIS — C9000 Multiple myeloma not having achieved remission: Secondary | ICD-10-CM

## 2024-01-06 ENCOUNTER — Inpatient Hospital Stay (HOSPITAL_BASED_OUTPATIENT_CLINIC_OR_DEPARTMENT_OTHER): Payer: Medicare Other | Admitting: Hematology

## 2024-01-06 ENCOUNTER — Inpatient Hospital Stay: Payer: Medicare Other | Attending: Hematology

## 2024-01-06 ENCOUNTER — Inpatient Hospital Stay: Payer: Medicare Other

## 2024-01-06 VITALS — BP 125/70 | HR 64 | Temp 98.1°F | Resp 17 | Wt 228.7 lb

## 2024-01-06 DIAGNOSIS — Z79899 Other long term (current) drug therapy: Secondary | ICD-10-CM | POA: Insufficient documentation

## 2024-01-06 DIAGNOSIS — C9000 Multiple myeloma not having achieved remission: Secondary | ICD-10-CM | POA: Insufficient documentation

## 2024-01-06 DIAGNOSIS — C9001 Multiple myeloma in remission: Secondary | ICD-10-CM | POA: Diagnosis not present

## 2024-01-06 LAB — CMP (CANCER CENTER ONLY)
ALT: 23 U/L (ref 0–44)
AST: 23 U/L (ref 15–41)
Albumin: 4 g/dL (ref 3.5–5.0)
Alkaline Phosphatase: 32 U/L — ABNORMAL LOW (ref 38–126)
Anion gap: 2 — ABNORMAL LOW (ref 5–15)
BUN: 16 mg/dL (ref 8–23)
CO2: 30 mmol/L (ref 22–32)
Calcium: 10.2 mg/dL (ref 8.9–10.3)
Chloride: 106 mmol/L (ref 98–111)
Creatinine: 1.13 mg/dL (ref 0.61–1.24)
GFR, Estimated: >60 mL/min (ref >60)
Glucose, Bld: 95 mg/dL (ref 70–99)
Potassium: 4.4 mmol/L (ref 3.5–5.1)
Sodium: 138 mmol/L (ref 135–145)
Total Bilirubin: 0.8 mg/dL (ref 0.0–1.2)
Total Protein: 6.8 g/dL (ref 6.5–8.1)

## 2024-01-06 LAB — CBC WITH DIFFERENTIAL (CANCER CENTER ONLY)
Abs Immature Granulocytes: 0.01 10*3/uL (ref 0.00–0.07)
Basophils Absolute: 0 10*3/uL (ref 0.0–0.1)
Basophils Relative: 1 %
Eosinophils Absolute: 0.1 10*3/uL (ref 0.0–0.5)
Eosinophils Relative: 5 %
HCT: 43.6 % (ref 39.0–52.0)
Hemoglobin: 14.2 g/dL (ref 13.0–17.0)
Immature Granulocytes: 0 %
Lymphocytes Relative: 29 %
Lymphs Abs: 0.7 10*3/uL (ref 0.7–4.0)
MCH: 27.6 pg (ref 26.0–34.0)
MCHC: 32.6 g/dL (ref 30.0–36.0)
MCV: 84.8 fL (ref 80.0–100.0)
Monocytes Absolute: 0.5 10*3/uL (ref 0.1–1.0)
Monocytes Relative: 20 %
Neutro Abs: 1.1 10*3/uL — ABNORMAL LOW (ref 1.7–7.7)
Neutrophils Relative %: 45 %
Platelet Count: 114 10*3/uL — ABNORMAL LOW (ref 150–400)
RBC: 5.14 MIL/uL (ref 4.22–5.81)
RDW: 14.8 % (ref 11.5–15.5)
WBC Count: 2.5 10*3/uL — ABNORMAL LOW (ref 4.0–10.5)
nRBC: 0 % (ref 0.0–0.2)

## 2024-01-06 MED ORDER — SODIUM CHLORIDE 0.9 % IV SOLN: INTRAVENOUS | Status: DC

## 2024-01-06 MED ORDER — ZOLEDRONIC ACID 4 MG/100ML IV SOLN
4.0000 mg | Freq: Once | INTRAVENOUS | Status: AC
  Administered 2024-01-06: 4 mg via INTRAVENOUS
  Filled 2024-01-06: qty 100

## 2024-01-06 NOTE — Patient Instructions (Signed)

## 2024-01-06 NOTE — Progress Notes (Signed)
HEMATOLOGY/ONCOLOGY CLINIC VISIT NOTE   Date of Service: 01/06/24  Patient Care Team: Shade Flood, MD as PCP - General (Family Medicine) [name], MD as Consulting Physician (Hematology) Tat, Octaviano Batty, DO as Consulting Physician (Neurology)  CHIEF COMPLAINTS/PURPOSE OF CONSULTATION:  Follow-up for continued evaluation and management of multiple myeloma  HISTORY OF PRESENTING ILLNESS:  Please see previous note for details of initial presentation  INTERVAL HISTORY  Nathan Martinez is a 78 y.o. male here for continued evaluation and management of multiple myeloma.  Patient was last seen by me on 10/07/2023 and reported fluctuating blood pressure readings, one bleeding event after accidentally cutting himself, carbuncle bump in his right neck which was draining, back pain typically present with activity, neuropathy, and feeling unsteady due to feet numbness.  Today, he reports that he has been doing fairly well since his last clinical visit with no new medical concerns.   Patient reports that neuropathy in his feet generally fluctuates in severity. He reports that his Gabapentin was recently increased to 300 MG twice a day.   Patient denies any diarrhea, new leg swelling, infection issues, abdominal pain, leg swelling, bleeding issues with Eliquis, or recent major medication changes.   He reports that he has been tolerating Revlimid 10 MG well without any new or major toxicities. Patient reports that he is towards the end of his Revlimid cycle at this time.   Patient reports that his wife may have the flu, though she has not been tested.   MEDICAL HISTORY:  Intermittent hypercalcemia - primary hyperparathyroidism status post parathyroidectomy in 2019 HTN HLD Glaucoma History of Helicobacter pylori infection previously treated Chronic leukopenia/neutropenia History of present iron deficiency anemia Acute DVT  SURGICAL HISTORY: Rt TKA 2018 Parathyroidectomy  2019 B/l cataracts sx 2021  SOCIAL HISTORY: Social History   Socioeconomic History   Marital status: Married    Spouse name: Not on file   Number of children: Not on file   Years of education: Not on file   Highest education level: Bachelor's degree (e.g., BA, AB, BS)  Occupational History   Occupation: retired    Comment: Manufacturing systems engineer  Tobacco Use   Smoking status: Never   Smokeless tobacco: Never  Vaping Use   Vaping status: Never Used  Substance and Sexual Activity   Alcohol use: Not Currently   Drug use: Never   Sexual activity: Not Currently    Birth control/protection: None  Other Topics Concern   Not on file  Social History Narrative   Right handed   retired   Chief Executive Officer Drivers of Corporate investment banker Strain: Low Risk  (11/11/2023)   Overall Financial Resource Strain (CARDIA)    Difficulty of Paying Living Expenses: Not hard at all  Food Insecurity: No Food Insecurity (11/11/2023)   Hunger Vital Sign    Worried About Running Out of Food in the Last Year: Never true    Ran Out of Food in the Last Year: Never true  Transportation Needs: No Transportation Needs (11/11/2023)   PRAPARE - Administrator, Civil Service (Medical): No    Lack of Transportation (Non-Medical): No  Physical Activity: Insufficiently Active (11/11/2023)   Exercise Vital Sign    Days of Exercise per Week: 2 days    Minutes of Exercise per Session: 60 min  Stress: No Stress Concern Present (11/11/2023)   [employer] of Occupational Health - Occupational Stress Questionnaire    Feeling of Stress : Not  at all  Social Connections: Socially Integrated (11/11/2023)   Social Connection and Isolation Panel [NHANES]    Frequency of Communication with Friends and Family: Twice a week    Frequency of Social Gatherings with Friends and Family: Once a week    Attends Religious Services: More than 4 times per year    Active Member of [employer] or Organizations: No    Attends Probation officer: More than 4 times per year    Marital Status: Married  Catering manager Violence: Not At Risk (02/18/2023)   Humiliation, Afraid, Rape, and Kick questionnaire    Fear of Current or Ex-Partner: No    Emotionally Abused: No    Physically Abused: No    Sexually Abused: No  Ex smoker (quit 1976) Ex secret service agent ETOH social use  FAMILY HISTORY: Sister - uterine/ovarian cancer 2's  Mother-- lived into her 33's Father - died in 3's - fall/trauma  ALLERGIES:  has no known allergies.  MEDICATIONS:  Current Outpatient Medications on File Prior to Visit  Medication Sig Dispense Refill   amLODipine (NORVASC) 5 MG tablet Take 1 tablet (5 mg total) by mouth daily. 90 tablet 1   atorvastatin (LIPITOR) 20 MG tablet Take 1 tablet (20 mg total) by mouth at bedtime. 90 tablet 1   ELIQUIS 5 MG TABS tablet Take 1 tablet by mouth twice daily 60 tablet 0   ergocalciferol (VITAMIN D2) 1.25 MG (50000 UT) capsule Take 1 capsule (50,000 Units total) by mouth once a week. 30 capsule 0   gabapentin (NEURONTIN) 300 MG capsule Take 1 capsule (300 mg total) by mouth 2 (two) times daily. 180 capsule 1   iron polysaccharides (FERREX 150) 150 MG capsule Take 1 capsule (150 mg total) by mouth daily. 90 capsule 0   Krill Oil (OMEGA-3) 500 MG CAPS      latanoprost (XALATAN) 0.005 % ophthalmic solution SMARTSIG:1 Drop(s) In Eye(s) Every Evening     Magnesium Oxide 400 MG CAPS      Multiple Vitamins-Minerals (MULTIVITAMIN MEN 50+) TABS Take by mouth.     olmesartan (BENICAR) 40 MG tablet Take 1 tablet (40 mg total) by mouth daily. 90 tablet 1   propranolol (INDERAL) 10 MG tablet Take 1 tablet by mouth twice daily 180 tablet 0   REVLIMID 10 MG capsule TAKE 1 CAPSULE BY MOUTH 1 TIME A DAY FOR 21 DAYS ON THEN 7 DAYS OFF 21 capsule 0   No current facility-administered medications on file prior to visit.     REVIEW OF SYSTEMS:    10 Point review of Systems was done is negative except  as noted above.   PHYSICAL EXAMINATION: .BP 125/70 (BP Location: Left Arm, Patient Position: Sitting)   Pulse 64   Temp 98.1 F (36.7 C) (Temporal)   Resp 17   Wt 228 lb 11.2 oz (103.7 kg)   SpO2 100%   BMI 30.17 kg/m  GENERAL:alert, in no acute distress and comfortable SKIN: no acute rashes, no significant lesions EYES: conjunctiva are pink and non-injected, sclera anicteric OROPHARYNX: MMM, no exudates, no oropharyngeal erythema or ulceration NECK: supple, no JVD LYMPH:  no palpable lymphadenopathy in the cervical, axillary or inguinal regions LUNGS: clear to auscultation b/l with normal respiratory effort HEART: regular rate & rhythm ABDOMEN:  normoactive bowel sounds , non tender, not distended. Extremity: no pedal edema PSYCH: alert & oriented x 3 with fluent speech NEURO: no focal motor/sensory deficits    LABORATORY DATA:  I have  reviewed the data as listed  .    Latest Ref Rng & Units 01/06/2024   12:09 PM 10/07/2023    2:12 PM 08/05/2023   12:44 PM  CBC  WBC 4.0 - 10.5 K/uL 2.5  3.3  2.8   Hemoglobin 13.0 - 17.0 g/dL 47.8  29.5  62.1   Hematocrit 39.0 - 52.0 % 43.6  42.4  42.6   Platelets 150 - 400 K/uL 114  125  132    CBC    Component Value Date/Time   WBC 2.5 (L) 01/06/2024 1209   WBC 5.1 08/11/2022 0807   RBC 5.14 01/06/2024 1209   HGB 14.2 01/06/2024 1209   HCT 43.6 01/06/2024 1209   PLT 114 (L) 01/06/2024 1209   MCV 84.8 01/06/2024 1209   MCH 27.6 01/06/2024 1209   MCHC 32.6 01/06/2024 1209   RDW 14.8 01/06/2024 1209   LYMPHSABS 0.7 01/06/2024 1209   MONOABS 0.5 01/06/2024 1209   EOSABS 0.1 01/06/2024 1209   BASOSABS 0.0 01/06/2024 1209    .    Latest Ref Rng & Units 01/06/2024   12:09 PM 11/12/2023   11:47 AM 10/07/2023    2:12 PM  CMP  Glucose 70 - 99 mg/dL 95  80  308   BUN 8 - 23 mg/dL 16  13  17    Creatinine 0.61 - 1.24 mg/dL 6.57  8.46  9.62   Sodium 135 - 145 mmol/L 138  137  138   Potassium 3.5 - 5.1 mmol/L 4.4  4.1  3.7    Chloride 98 - 111 mmol/L 106  103  107   CO2 22 - 32 mmol/L 30  30  28    Calcium 8.9 - 10.3 mg/dL 95.2  84.1  32.4   Total Protein 6.5 - 8.1 g/dL 6.8  6.8  6.7   Total Bilirubin 0.0 - 1.2 mg/dL 0.8  1.4  1.0   Alkaline Phos 38 - 126 U/L 32  30  31   AST 15 - 41 U/L 23  27  26    ALT 0 - 44 U/L 23  26  23       IEOP/Immunofixation Order: [id number] Component 9 d ago  PATHOLOGIST CHARGE ID  [id number] - Ambrose Finland, M.D.   SERUM IEOP COMMENT   NO DEFINITE CLONALITY DETECTED.   Reviewed and Interpreted By   Ambrose Finland, M.D.       RADIOGRAPHIC STUDIES:   [place] lower extremity venous ([id number]) done 08/19/2022 revealed "Summary:  RIGHT:  - Findings consistent with acute deep vein thrombosis involving the right  proximal, mid, and distal femoral vein, right popliteal vein, and right  peroneal veins.  - No cystic structure found in the popliteal fossa.     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 Most recent PET restaging scan done 08/13/2022 showed no overt signs of disease progression. Myeloma FISH panel no reported mutations. Cytogenetics-loss of Y chromosome.  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 Recent labs have shown resolution of his leukopenia/neutropenia.  We will continue to monitor on  Revlimid.  3) h/o DVT of the right lower extremity Ultrasound right lower extremity 08/19/2022- Findings consistent with acute deep vein thrombosis  involving the right  proximal, mid, and distal femoral vein, right popliteal vein, and right  peroneal veins.  Resolved and continues to be on anticoagulation.  4) history of stye -continue f/u with ophthamology Currently not an acute issue and has resolved since the patient has been off Velcade.  PLAN:  -Discussed lab results on 01/06/24 in detail with patient. CBC stable, showed WBC of 2.5K, hemoglobin of 14.2, and platelets of 114K. -his platelets and WBCs tend to fluctuate based on where he is in his cycle of Revlimid. -patient's last several labs over the last 11 months have shown that his M protein has been undetectable and he continues to be in remission -myeloma lab from today is pending at time of clinical visit -Patient has been tolerating his Revlimid dosage well without any new or severe toxicities.   -continue maintenance Revlimid at 10 MG 3 weeks on, 1 week off -Continue Eliquis 5 mg p.o. twice daily for continued protection while on Revlimid.  -continue zometa every three months  -recommend patient to stay UTD with his age-appropriate vaccinations -answered all of patient's questions in detail  FOLLOW-UP: RTC with Dr Candise Che with labs in 3 months with next dose of Zometa  The total time spent in the appointment was 23 minutes* .  All of the patient's questions were answered with apparent satisfaction. The patient knows to call the clinic with any problems, questions or concerns.   Wyvonnia Lora MD MS AAHIVMS [facility] Hematology/Oncology Physician [facility]  .*Total Encounter Time as defined by the Centers for Medicare and Medicaid Services includes, in addition to the face-to-face time of a patient visit (documented in the note above) non-face-to-face time: obtaining and reviewing outside history, ordering and reviewing medications, tests or procedures, care coordination (communications with other health care professionals or caregivers) and documentation in  the medical record.    I,Mitra Faeizi,acting as a Neurosurgeon for Wyvonnia Lora, MD.,have documented all relevant documentation on the behalf of Wyvonnia Lora, MD,as directed by  Wyvonnia Lora, MD while in the presence of Wyvonnia Lora, MD.  .I have reviewed the above documentation for accuracy and completeness, and I agree with the above. [name] MD

## 2024-01-06 NOTE — Progress Notes (Signed)
Patient seen by Dr. Addison Naegeli are within treatment parameters.  Labs reviewed: and are not all within treatment parameters.    Dr Candise Che aware ANC: 1.1, Plts 114,   Per physician team, patient is ready for treatment and there are NO modifications to the treatment plan.

## 2024-01-07 ENCOUNTER — Telehealth: Payer: Self-pay | Admitting: Hematology

## 2024-01-07 LAB — KAPPA/LAMBDA LIGHT CHAINS
Kappa free light chain: 35 mg/L — ABNORMAL HIGH (ref 3.3–19.4)
Kappa, lambda light chain ratio: 1.96 — ABNORMAL HIGH (ref 0.26–1.65)
Lambda free light chains: 17.9 mg/L (ref 5.7–26.3)

## 2024-01-07 NOTE — Telephone Encounter (Signed)
Left patient a vm regarding upcoming appointment

## 2024-01-10 LAB — MULTIPLE MYELOMA PANEL, SERUM
Albumin SerPl Elph-Mcnc: 3.6 g/dL (ref 2.9–4.4)
Albumin/Glob SerPl: 1.3 (ref 0.7–1.7)
Alpha 1: 0.2 g/dL (ref 0.0–0.4)
Alpha2 Glob SerPl Elph-Mcnc: 0.5 g/dL (ref 0.4–1.0)
B-Globulin SerPl Elph-Mcnc: 0.8 g/dL (ref 0.7–1.3)
Gamma Glob SerPl Elph-Mcnc: 1.3 g/dL (ref 0.4–1.8)
Globulin, Total: 2.8 g/dL (ref 2.2–3.9)
IgA: 150 mg/dL (ref 61–437)
IgG (Immunoglobin G), Serum: 1474 mg/dL (ref 603–1613)
IgM (Immunoglobulin M), Srm: 21 mg/dL (ref 15–143)
Total Protein ELP: 6.4 g/dL (ref 6.0–8.5)

## 2024-01-13 ENCOUNTER — Encounter: Payer: Self-pay | Admitting: Hematology

## 2024-01-19 ENCOUNTER — Other Ambulatory Visit: Payer: Self-pay | Admitting: Family Medicine

## 2024-01-19 DIAGNOSIS — E559 Vitamin D deficiency, unspecified: Secondary | ICD-10-CM

## 2024-01-19 DIAGNOSIS — D472 Monoclonal gammopathy: Secondary | ICD-10-CM

## 2024-01-25 ENCOUNTER — Other Ambulatory Visit: Payer: Self-pay | Admitting: Hematology

## 2024-01-25 DIAGNOSIS — C9000 Multiple myeloma not having achieved remission: Secondary | ICD-10-CM

## 2024-01-27 ENCOUNTER — Other Ambulatory Visit: Payer: Self-pay | Admitting: Hematology

## 2024-01-27 DIAGNOSIS — C9000 Multiple myeloma not having achieved remission: Secondary | ICD-10-CM

## 2024-02-02 ENCOUNTER — Other Ambulatory Visit: Payer: Self-pay | Admitting: Family Medicine

## 2024-02-02 DIAGNOSIS — D472 Monoclonal gammopathy: Secondary | ICD-10-CM

## 2024-02-13 IMAGING — CT CT BIOPSY AND ASPIRATION BONE MARROW
1 of 3 series · 15 of 30 positions shown, 19 images · non-contrast
Comparison: none

INDICATION: Bone marrow aspiration and biopsy for evaluation of multiple
myeloma; Unilateral bone marrow aspiration and biopsy for evaluation
of multiple myeloma

[Series 2: i-spiral 5.0 br40 · axial · 0.98mm/px · z∈[-278,-173]mm · 15 of 34 slices shown, 19 images]
[im 2/34  mediastinal]
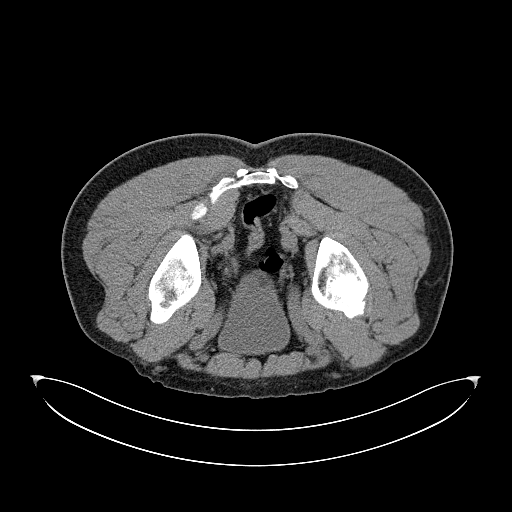
[im 2/34  lung]
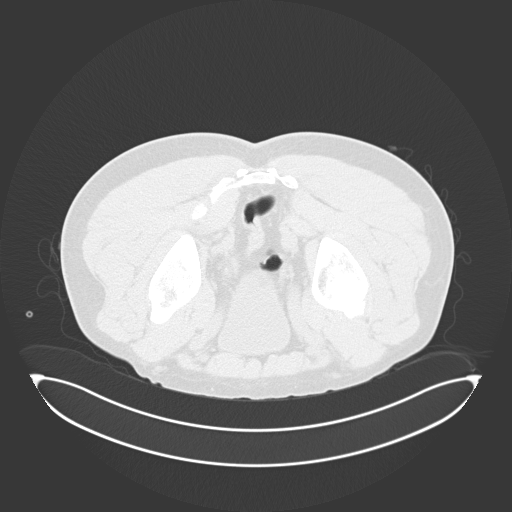
[im 6/34  lung]
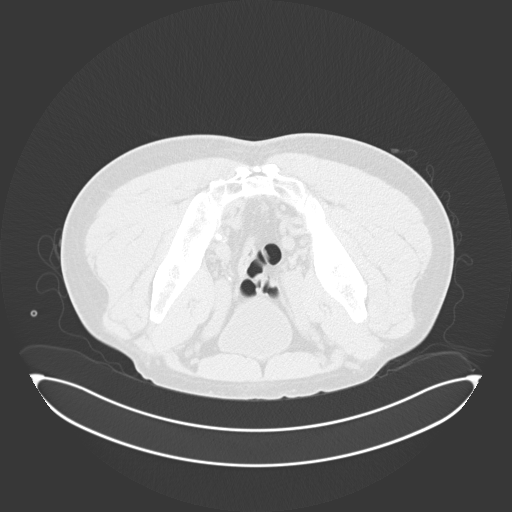
[im 7/34  lung]
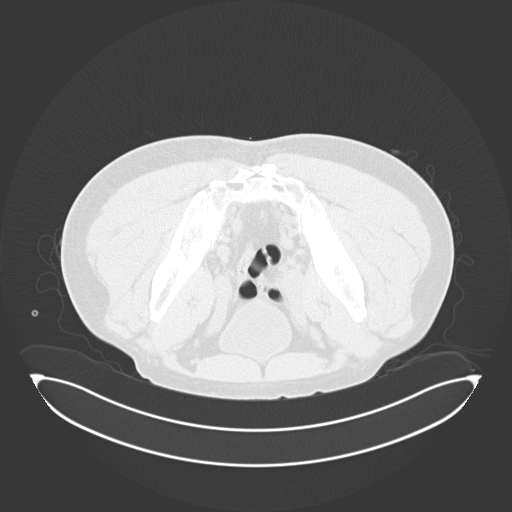
[im 9/34  lung]
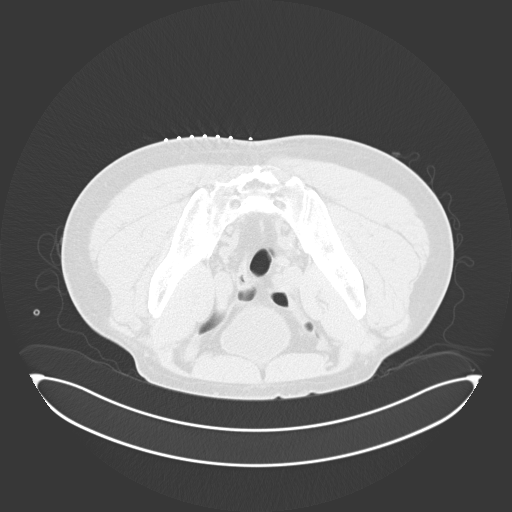
[im 13/34  mediastinal]
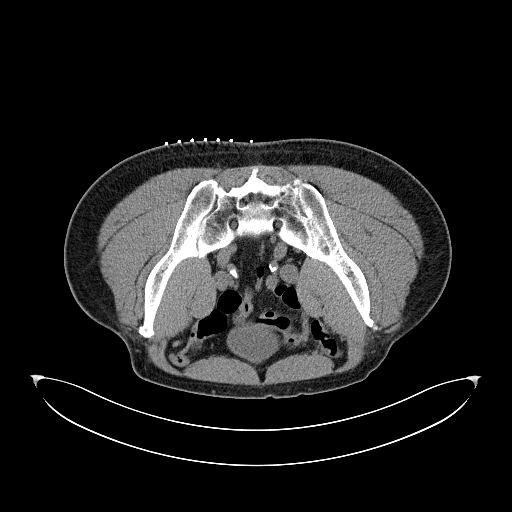
[im 13/34  lung]
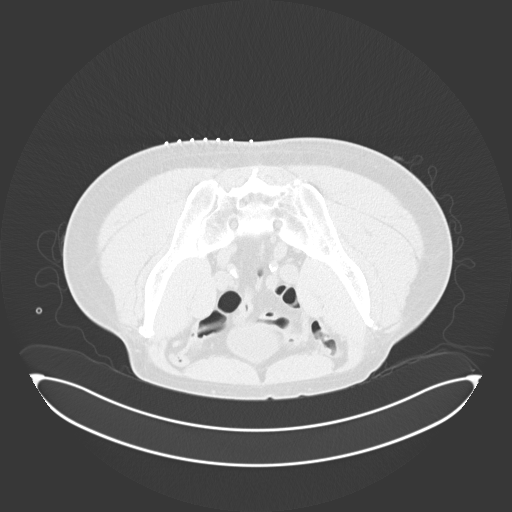
[im 14/34  lung]
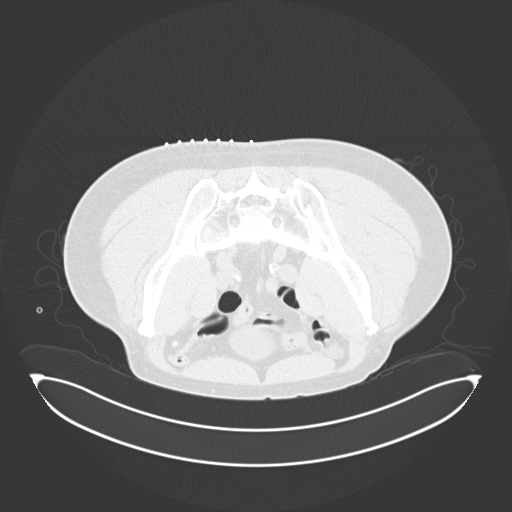
[im 15/34  lung]
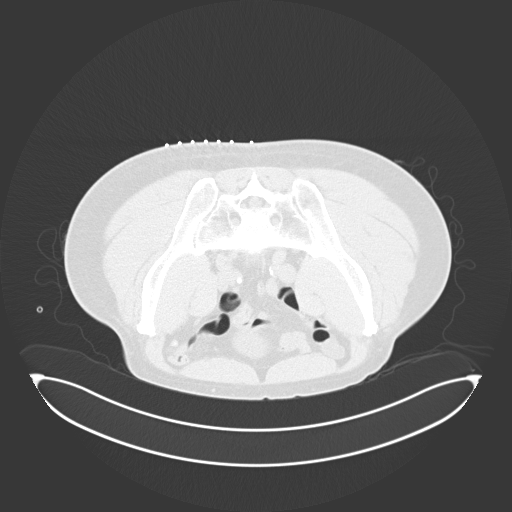
[im 17/34  lung]
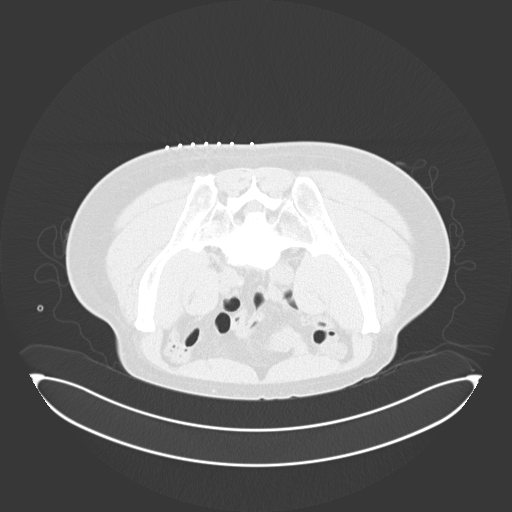
[im 18/34  mediastinal]
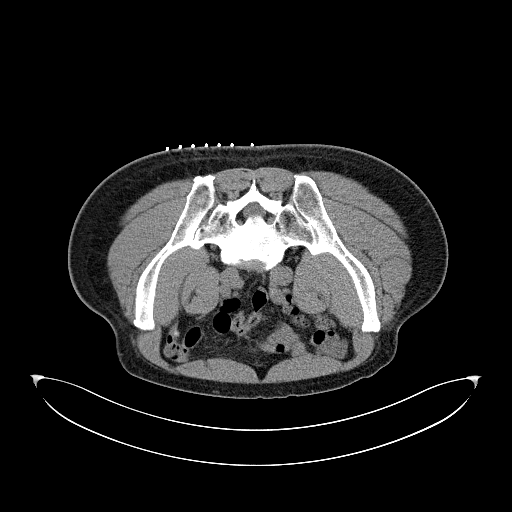
[im 18/34  lung]
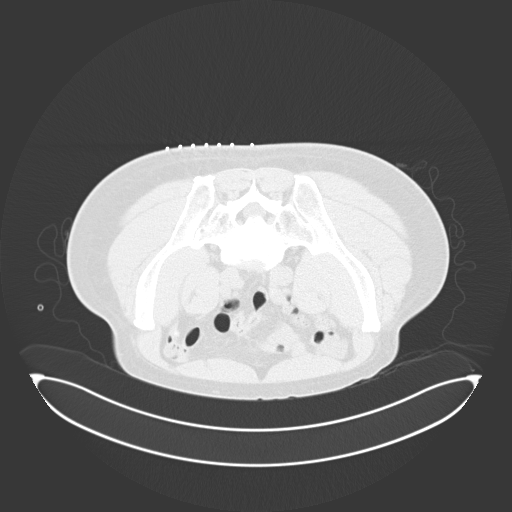
[im 20/34  lung]
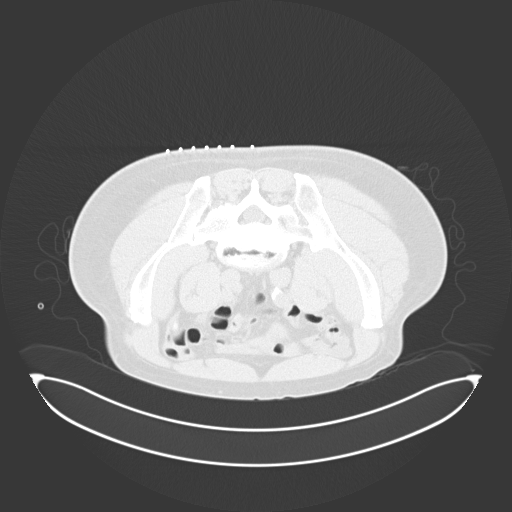
[im 23/34  lung]
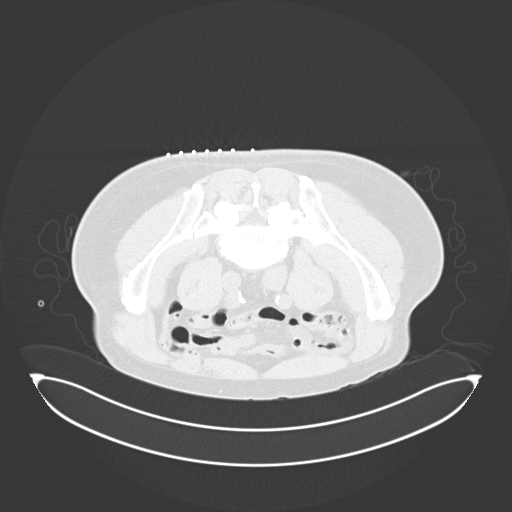
[im 25/34  lung]
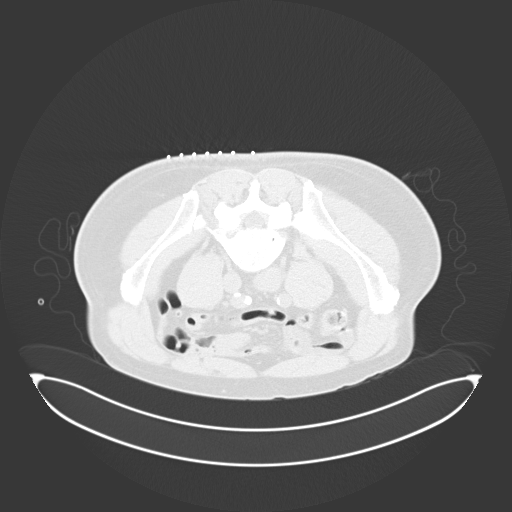
[im 27/34  mediastinal]
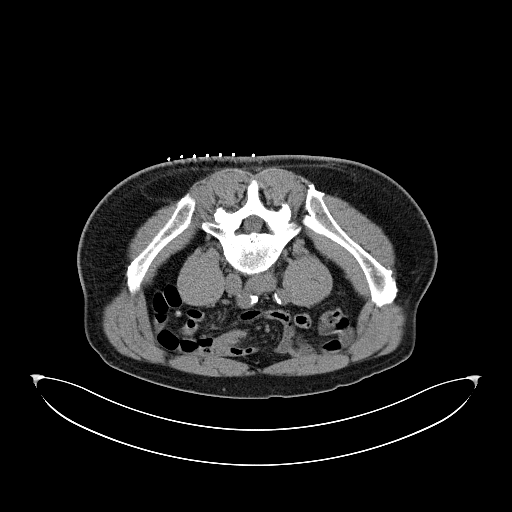
[im 27/34  lung]
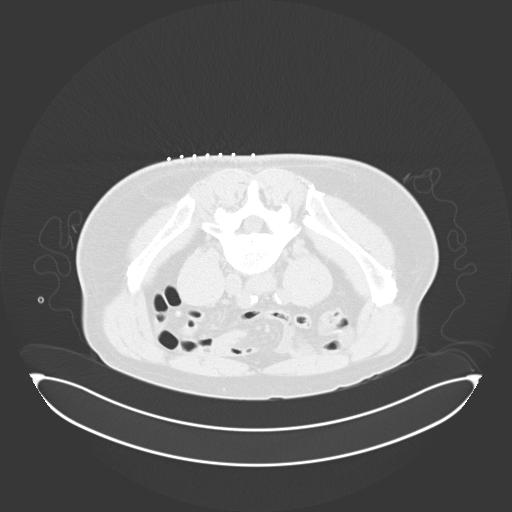
[im 30/34  lung]
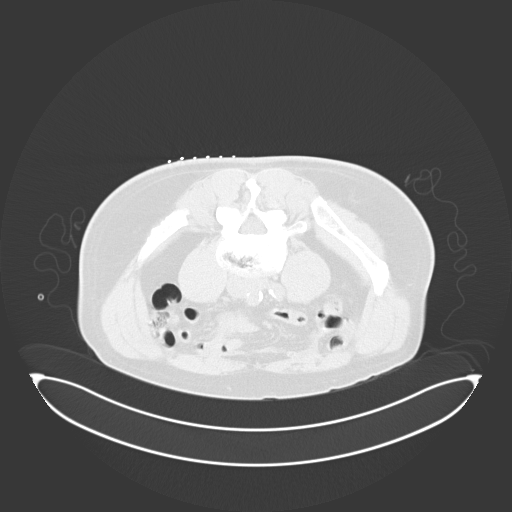
[im 32/34  lung]
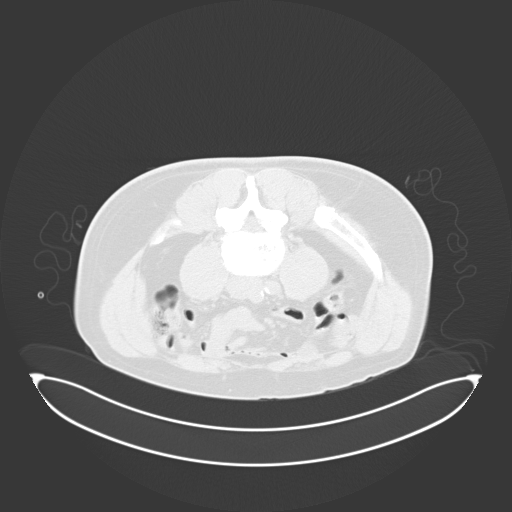

[15 of 30 positions shown; findings below may reference images not displayed]

EXAM:
CT BONE MARROW BIOPSY AND ASPIRATION; CT BIOPSY

MEDICATIONS:
None.

ANESTHESIA/SEDATION:
Moderate (conscious) sedation was employed during this procedure. A
total of Versed 2 mg and Fentanyl 100 mcg was administered
intravenously.

Moderate Sedation Time: 10 minutes. The patient's level of
consciousness and vital signs were monitored continuously by
radiology nursing throughout the procedure under my direct
supervision.

FLUOROSCOPY TIME:  N/a

COMPLICATIONS:
None immediate.

PROCEDURE:
Informed written consent was obtained from the patient after a
thorough discussion of the procedural risks, benefits and
alternatives. All questions were addressed. Maximal Sterile Barrier
Technique was utilized including caps, mask, sterile gowns, sterile
gloves, sterile drape, hand hygiene and skin antiseptic. A timeout
was performed prior to the initiation of the procedure.

The patient was placed prone on the CT exam table. Limited CT of the
pelvis was performed for planning purposes. Skin entry site was
marked, and the overlying skin was prepped and draped in the
standard sterile fashion. Local analgesia was obtained with 1%
lidocaine. Using CT guidance, an 11 gauge needle was advanced just
deep to the cortex of the right posterior ilium. Subsequently, bone
marrow aspiration and core biopsy were performed. Specimens were
submitted to lab/pathology for handling. Hemostasis was achieved
with manual pressure, and a clean dressing was placed. The patient
tolerated the procedure well without immediate complication.
IMPRESSION: Successful CT-guided bone marrow aspiration and core biopsy of the
right posterior ilium.

## 2024-02-13 IMAGING — CT CT BIOPSY
1 of 3 series · 15 of 30 positions shown, 19 images · non-contrast
Comparison: none

INDICATION: Bone marrow aspiration and biopsy for evaluation of multiple
myeloma; Unilateral bone marrow aspiration and biopsy for evaluation
of multiple myeloma

[Series 2: i-spiral 5.0 br40 · axial · 0.98mm/px · z∈[-278,-173]mm · 15 of 34 slices shown, 19 images]
[im 2/34  mediastinal]
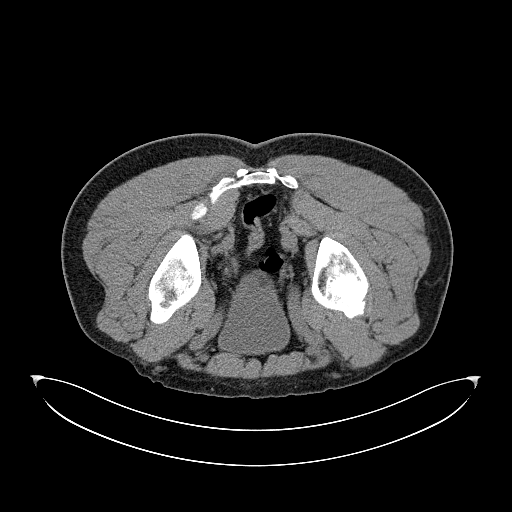
[im 2/34  lung]
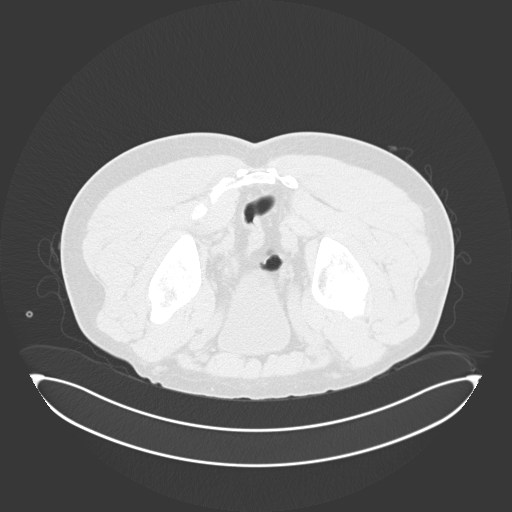
[im 6/34  lung]
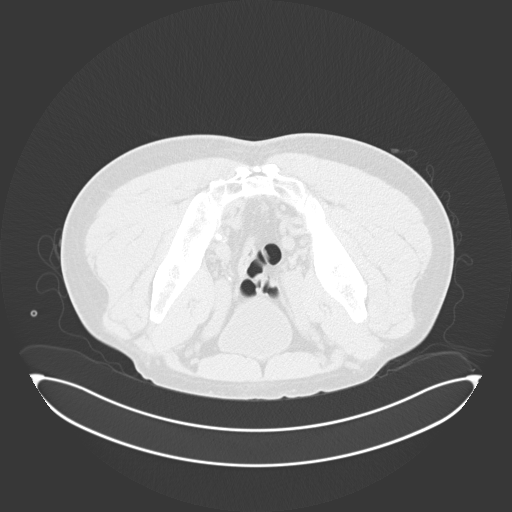
[im 7/34  lung]
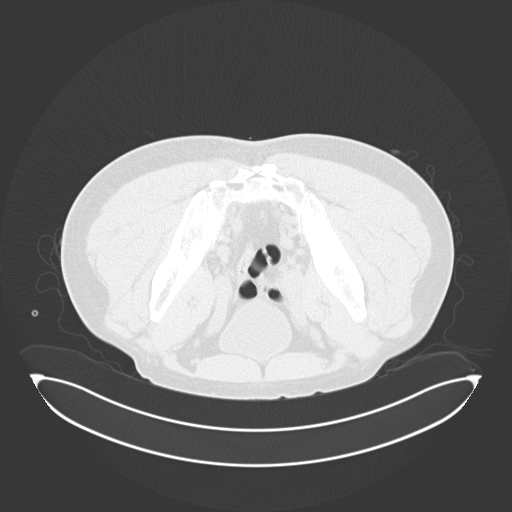
[im 9/34  lung]
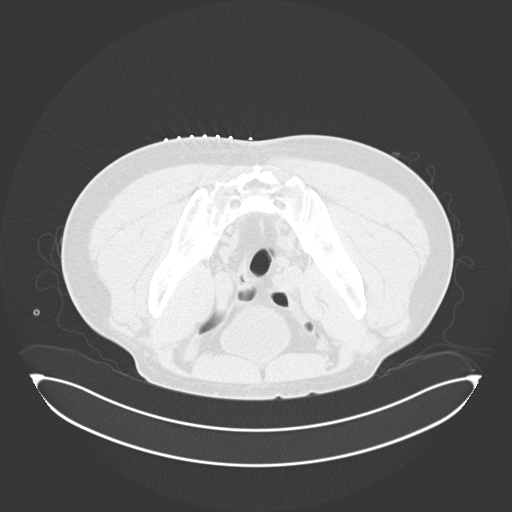
[im 13/34  mediastinal]
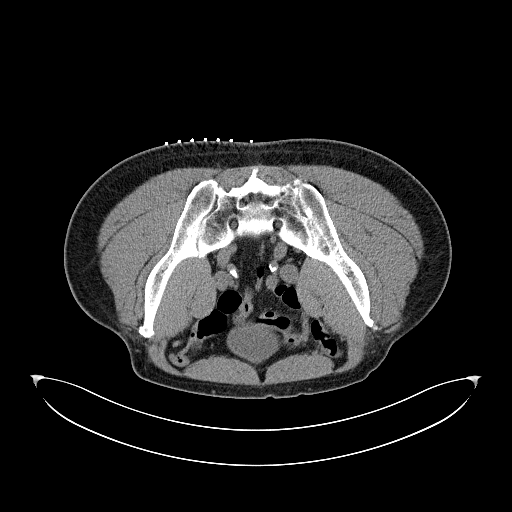
[im 13/34  lung]
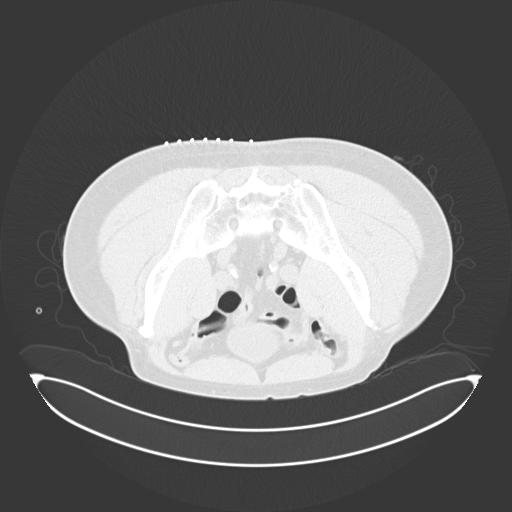
[im 14/34  lung]
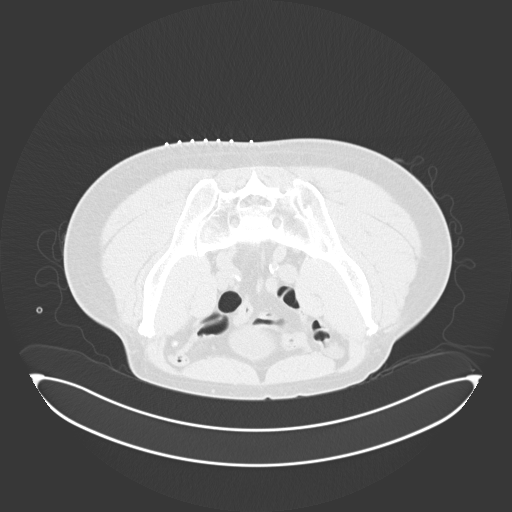
[im 15/34  lung]
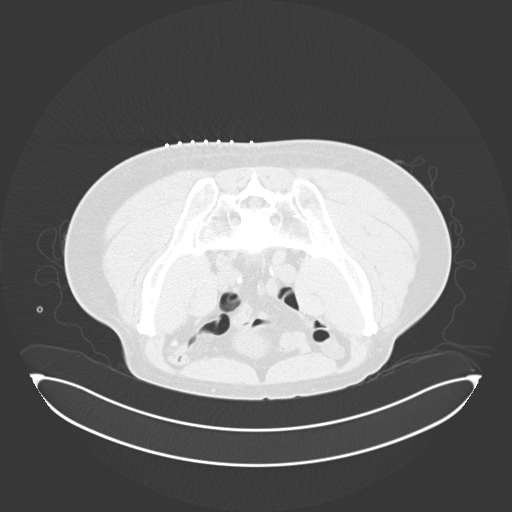
[im 17/34  lung]
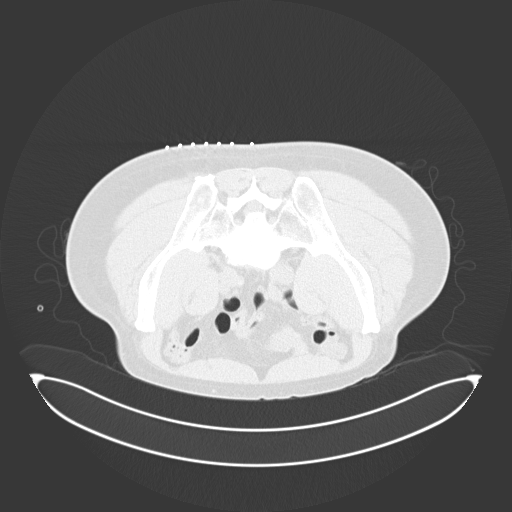
[im 18/34  mediastinal]
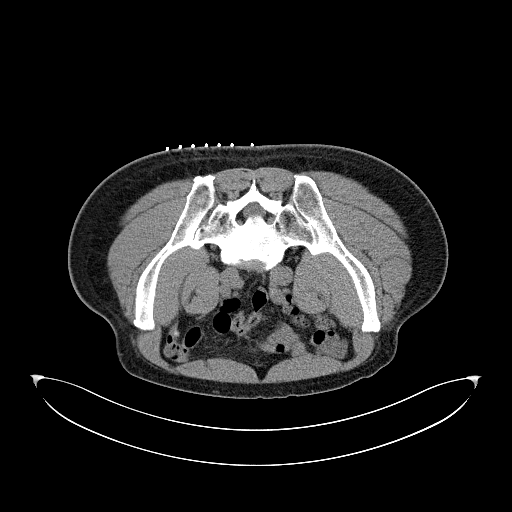
[im 18/34  lung]
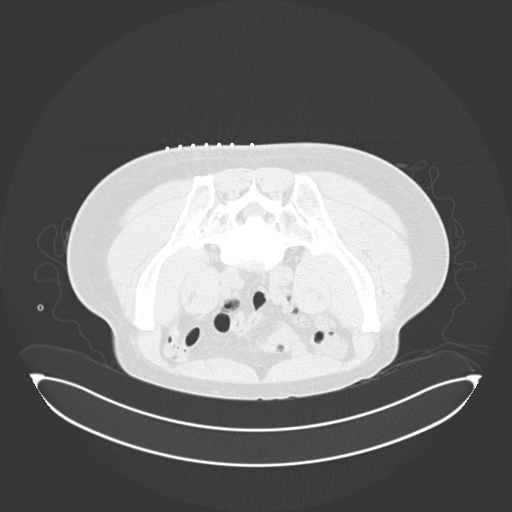
[im 20/34  lung]
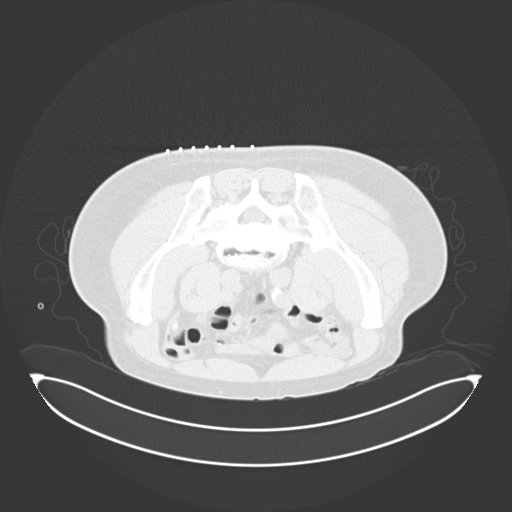
[im 23/34  lung]
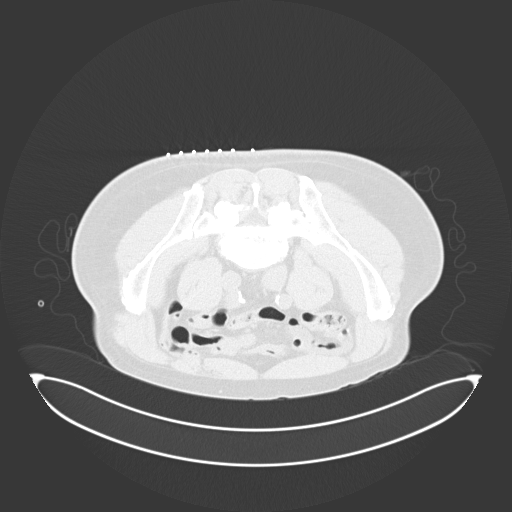
[im 25/34  lung]
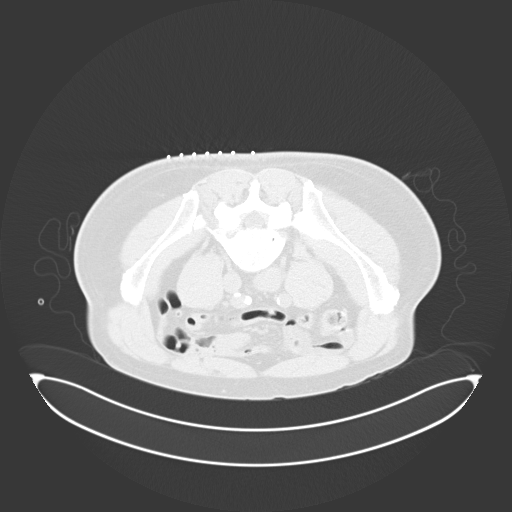
[im 27/34  mediastinal]
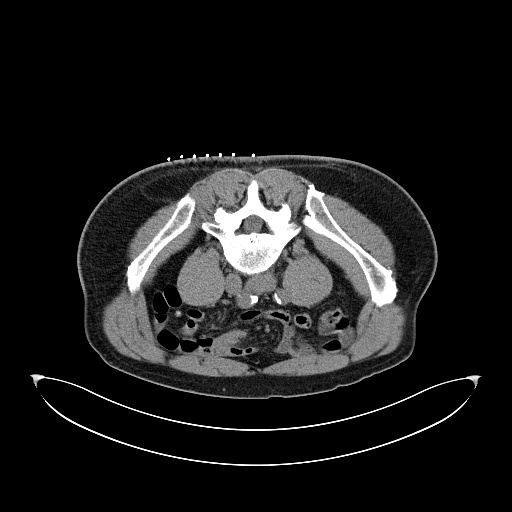
[im 27/34  lung]
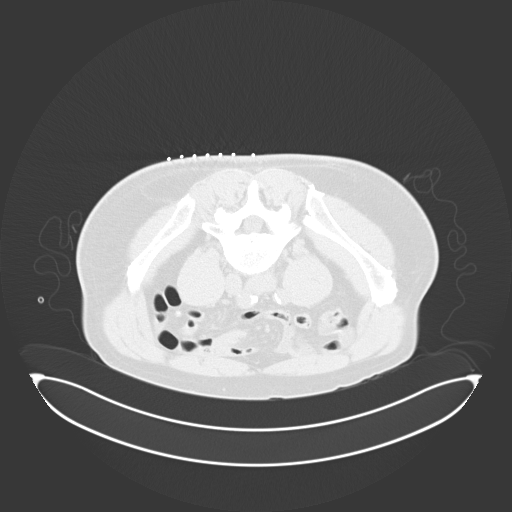
[im 30/34  lung]
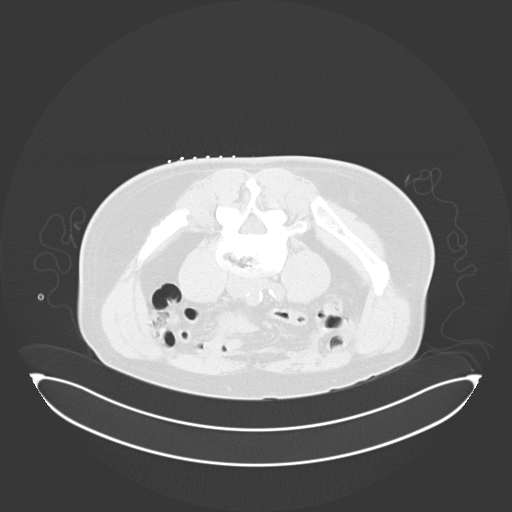
[im 32/34  lung]
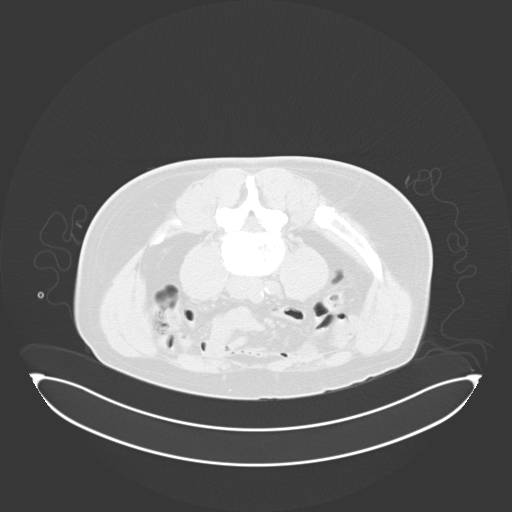

[15 of 30 positions shown; findings below may reference images not displayed]

EXAM:
CT BONE MARROW BIOPSY AND ASPIRATION; CT BIOPSY

MEDICATIONS:
None.

ANESTHESIA/SEDATION:
Moderate (conscious) sedation was employed during this procedure. A
total of Versed 2 mg and Fentanyl 100 mcg was administered
intravenously.

Moderate Sedation Time: 10 minutes. The patient's level of
consciousness and vital signs were monitored continuously by
radiology nursing throughout the procedure under my direct
supervision.

FLUOROSCOPY TIME:  N/a

COMPLICATIONS:
None immediate.

PROCEDURE:
Informed written consent was obtained from the patient after a
thorough discussion of the procedural risks, benefits and
alternatives. All questions were addressed. Maximal Sterile Barrier
Technique was utilized including caps, mask, sterile gowns, sterile
gloves, sterile drape, hand hygiene and skin antiseptic. A timeout
was performed prior to the initiation of the procedure.

The patient was placed prone on the CT exam table. Limited CT of the
pelvis was performed for planning purposes. Skin entry site was
marked, and the overlying skin was prepped and draped in the
standard sterile fashion. Local analgesia was obtained with 1%
lidocaine. Using CT guidance, an 11 gauge needle was advanced just
deep to the cortex of the right posterior ilium. Subsequently, bone
marrow aspiration and core biopsy were performed. Specimens were
submitted to lab/pathology for handling. Hemostasis was achieved
with manual pressure, and a clean dressing was placed. The patient
tolerated the procedure well without immediate complication.
IMPRESSION: Successful CT-guided bone marrow aspiration and core biopsy of the
right posterior ilium.

## 2024-02-23 ENCOUNTER — Other Ambulatory Visit: Payer: Self-pay | Admitting: Hematology

## 2024-02-23 DIAGNOSIS — C9000 Multiple myeloma not having achieved remission: Secondary | ICD-10-CM

## 2024-02-24 ENCOUNTER — Other Ambulatory Visit: Payer: Self-pay | Admitting: Hematology

## 2024-02-24 DIAGNOSIS — C9000 Multiple myeloma not having achieved remission: Secondary | ICD-10-CM

## 2024-02-26 ENCOUNTER — Encounter: Payer: Self-pay | Admitting: Hematology

## 2024-03-01 ENCOUNTER — Ambulatory Visit (INDEPENDENT_AMBULATORY_CARE_PROVIDER_SITE_OTHER): Admitting: *Deleted

## 2024-03-01 DIAGNOSIS — Z0000 Encounter for general adult medical examination without abnormal findings: Secondary | ICD-10-CM

## 2024-03-01 NOTE — Patient Instructions (Signed)
Nathan Martinez , Thank you for taking time to come for your Medicare Wellness Visit. I appreciate your ongoing commitment to your health goals. Please review the following plan we discussed and let me know if I can assist you in the future.   Screening recommendations/referrals: Colonoscopy: no longer required Recommended yearly ophthalmology/optometry visit for glaucoma screening and checkup Recommended yearly dental visit for hygiene and checkup  Vaccinations: Influenza vaccine: up to date Pneumococcal vaccine: up to date Tdap vaccine: up to date Shingles vaccine: up to date    Advanced directives: up to date    Preventive Care 78 Years and Older, Male Preventive care refers to lifestyle choices and visits with your health care provider that can promote health and wellness. What does preventive care include? A yearly physical exam. This is also called an annual well check. Dental exams once or twice a year. Routine eye exams. Ask your health care provider how often you should have your eyes checked. Personal lifestyle choices, including: Daily care of your teeth and gums. Regular physical activity. Eating a healthy diet. Avoiding tobacco and drug use. Limiting alcohol use. Practicing safe sex. Taking low doses of aspirin every day. Taking vitamin and mineral supplements as recommended by your health care provider. What happens during an annual well check? The services and screenings done by your health care provider during your annual well check will depend on your age, overall health, lifestyle risk factors, and family history of disease. Counseling  Your health care provider may ask you questions about your: Alcohol use. Tobacco use. Drug use. Emotional well-being. Home and relationship well-being. Sexual activity. Eating habits. History of falls. Memory and ability to understand (cognition). Work and work Statistician. Screening  You may have the following tests or  measurements: Height, weight, and BMI. Blood pressure. Lipid and cholesterol levels. These may be checked every 5 years, or more frequently if you are over 55 years old. Skin check. Lung cancer screening. You may have this screening every year starting at age 42 if you have a 30-pack-year history of smoking and currently smoke or have quit within the past 15 years. Fecal occult blood test (FOBT) of the stool. You may have this test every year starting at age 23. Flexible sigmoidoscopy or colonoscopy. You may have a sigmoidoscopy every 5 years or a colonoscopy every 10 years starting at age 61. Prostate cancer screening. Recommendations will vary depending on your family history and other risks. Hepatitis C blood test. Hepatitis B blood test. Sexually transmitted disease (STD) testing. Diabetes screening. This is done by checking your blood sugar (glucose) after you have not eaten for a while (fasting). You may have this done every 1-3 years. Abdominal aortic aneurysm (AAA) screening. You may need this if you are a current or former smoker. Osteoporosis. You may be screened starting at age 30 if you are at high risk. Talk with your health care provider about your test results, treatment options, and if necessary, the need for more tests. Vaccines  Your health care provider may recommend certain vaccines, such as: Influenza vaccine. This is recommended every year. Tetanus, diphtheria, and acellular pertussis (Tdap, Td) vaccine. You may need a Td booster every 10 years. Zoster vaccine. You may need this after age 35. Pneumococcal 13-valent conjugate (PCV13) vaccine. One dose is recommended after age 33. Pneumococcal polysaccharide (PPSV23) vaccine. One dose is recommended after age 30. Talk to your health care provider about which screenings and vaccines you need and how often you need  them. This information is not intended to replace advice given to you by your health care provider. Make sure  you discuss any questions you have with your health care provider. Document Released: 12/07/2015 Document Revised: 07/30/2016 Document Reviewed: 09/11/2015 Elsevier Interactive Patient Education  2017 [place] Prevention in the Home Falls can cause injuries. They can happen to people of all ages. There are many things you can do to make your home safe and to help prevent falls. What can I do on the outside of my home? Regularly fix the edges of walkways and driveways and fix any cracks. Remove anything that might make you trip as you walk through a door, such as a raised step or threshold. Trim any bushes or trees on the path to your home. Use bright outdoor lighting. Clear any walking paths of anything that might make someone trip, such as rocks or tools. Regularly check to see if handrails are loose or broken. Make sure that both sides of any steps have handrails. Any raised decks and porches should have guardrails on the edges. Have any leaves, snow, or ice cleared regularly. Use sand or salt on walking paths during winter. Clean up any spills in your garage right away. This includes oil or grease spills. What can I do in the bathroom? Use night lights. Install grab bars by the toilet and in the tub and shower. Do not use towel bars as grab bars. Use non-skid mats or decals in the tub or shower. If you need to sit down in the shower, use a plastic, non-slip stool. Keep the floor dry. Clean up any water that spills on the floor as soon as it happens. Remove soap buildup in the tub or shower regularly. Attach bath mats securely with double-sided non-slip rug tape. Do not have throw rugs and other things on the floor that can make you trip. What can I do in the bedroom? Use night lights. Make sure that you have a light by your bed that is easy to reach. Do not use any sheets or blankets that are too big for your bed. They should not hang down onto the floor. Have a firm  chair that has side arms. You can use this for support while you get dressed. Do not have throw rugs and other things on the floor that can make you trip. What can I do in the kitchen? Clean up any spills right away. Avoid walking on wet floors. Keep items that you use a lot in easy-to-reach places. If you need to reach something above you, use a strong step stool that has a grab bar. Keep electrical cords out of the way. Do not use floor polish or wax that makes floors slippery. If you must use wax, use non-skid floor wax. Do not have throw rugs and other things on the floor that can make you trip. What can I do with my stairs? Do not leave any items on the stairs. Make sure that there are handrails on both sides of the stairs and use them. Fix handrails that are broken or loose. Make sure that handrails are as long as the stairways. Check any carpeting to make sure that it is firmly attached to the stairs. Fix any carpet that is loose or worn. Avoid having throw rugs at the top or bottom of the stairs. If you do have throw rugs, attach them to the floor with carpet tape. Make sure that you have a light switch at  the top of the stairs and the bottom of the stairs. If you do not have them, ask someone to add them for you. What else can I do to help prevent falls? Wear shoes that: Do not have high heels. Have rubber bottoms. Are comfortable and fit you well. Are closed at the toe. Do not wear sandals. If you use a stepladder: Make sure that it is fully opened. Do not climb a closed stepladder. Make sure that both sides of the stepladder are locked into place. Ask someone to hold it for you, if possible. Clearly mark and make sure that you can see: Any grab bars or handrails. First and last steps. Where the edge of each step is. Use tools that help you move around (mobility aids) if they are needed. These include: Canes. Walkers. Scooters. Crutches. Turn on the lights when you go  into a dark area. Replace any light bulbs as soon as they burn out. Set up your furniture so you have a clear path. Avoid moving your furniture around. If any of your floors are uneven, fix them. If there are any pets around you, be aware of where they are. Review your medicines with your doctor. Some medicines can make you feel dizzy. This can increase your chance of falling. Ask your doctor what other things that you can do to help prevent falls. This information is not intended to replace advice given to you by your health care provider. Make sure you discuss any questions you have with your health care provider. Document Released: 09/06/2009 Document Revised: 04/17/2016 Document Reviewed: 12/15/2014 Elsevier Interactive Patient Education  2017 [employer].

## 2024-03-01 NOTE — Progress Notes (Signed)
 Subjective:   Nathan Martinez is a 78 y.o. male who presents for Medicare Annual/Subsequent preventive examination.  Visit Complete: Virtual I connected with  Nathan Martinez on 03/01/24 by a audio enabled telemedicine application and verified that I am speaking with the correct person using two identifiers.  Patient Location: Home  Provider Location: Home Office  I discussed the limitations of evaluation and management by telemedicine. The patient expressed understanding and agreed to proceed.  Vital Signs: Because this visit was a virtual/telehealth visit, some criteria may be missing or patient reported. Any vitals not documented were not able to be obtained and vitals that have been documented are patient reported.  Patient Medicare AWV questionnaire was completed by the patient on 02-29-2024; I have confirmed that all information answered by patient is correct and no changes since this date.  Cardiac Risk Factors include: advanced age (>56men, >24 women);hypertension;male gender     Objective:    Today's Vitals   03/01/24 0852  PainSc: 2    There is no height or weight on file to calculate BMI.     03/01/2024    8:53 AM 11/13/2023   11:14 AM 02/18/2023    8:41 AM 12/22/2022   10:05 AM 08/27/2022    9:08 AM 08/11/2022    7:21 AM 01/28/2022    9:45 AM  Advanced Directives  Does Patient Have a Medical Advance Directive? Yes Yes Yes Yes Yes Yes Yes  Type of Estate agent of [employer] Power of [place];Living will Healthcare Power of [place] Living will Healthcare Power of [place];Living will Healthcare Power of [place];Living will Healthcare Power of [place];Living will  Does patient want to make changes to medical advance directive?   Yes (Inpatient - patient requests chaplain consult to change a medical advance directive)  No - Patient declined  No - Patient declined  Copy of Healthcare Power of Attorney in Chart? Yes - validated most recent copy  scanned in chart (See row information)  Yes - validated most recent copy scanned in chart (See row information)  No - copy requested  No - copy requested    Current Medications (verified) Outpatient Encounter Medications as of 03/01/2024  Medication Sig   amLODipine (NORVASC) 5 MG tablet Take 1 tablet (5 mg total) by mouth daily.   atorvastatin (LIPITOR) 20 MG tablet Take 1 tablet (20 mg total) by mouth at bedtime.   ELIQUIS 5 MG TABS tablet Take 1 tablet by mouth twice daily   FERREX 150 150 MG capsule Take 1 capsule by mouth once daily   gabapentin (NEURONTIN) 300 MG capsule Take 1 capsule (300 mg total) by mouth 2 (two) times daily.   Krill Oil (OMEGA-3) 500 MG CAPS    latanoprost (XALATAN) 0.005 % ophthalmic solution SMARTSIG:1 Drop(s) In Eye(s) Every Evening   Magnesium Oxide 400 MG CAPS    Multiple Vitamins-Minerals (MULTIVITAMIN MEN 50+) TABS Take by mouth.   olmesartan (BENICAR) 40 MG tablet Take 1 tablet (40 mg total) by mouth daily.   propranolol (INDERAL) 10 MG tablet Take 1 tablet by mouth twice daily   REVLIMID 10 MG capsule TAKE 1 CAPSULE BY MOUTH 1 TIME A DAY FOR 21 DAYS ON THEN 7 DAYS OFF   Vitamin D, Ergocalciferol, (DRISDOL) 1.25 MG (50000 UNIT) CAPS capsule Take 1 capsule by mouth once a week   No facility-administered encounter medications on file as of 03/01/2024.    Allergies (verified) Patient has no known allergies.   History: Past  Medical History:  Diagnosis Date   DVT (deep vein thrombosis) in pregnancy    Hyperlipidemia    Hypertension    Multiple myeloma (HCC)    Peripheral neuropathy    Past Surgical History:  Procedure Laterality Date   CATARACT EXTRACTION, BILATERAL     KNEE SURGERY Right 2018   PARATHYROID EXPLORATION     Family History  Problem Relation Age of Onset   Tremor Mother    Cancer Sister    Social History   Socioeconomic History   Marital status: Married    Spouse name: Not on file   Number of children: Not on file   Years of  education: Not on file   Highest education level: Bachelor's degree (e.g., BA, AB, BS)  Occupational History   Occupation: retired    Comment: Manufacturing systems engineer  Tobacco Use   Smoking status: Never   Smokeless tobacco: Never  Vaping Use   Vaping status: Never Used  Substance and Sexual Activity   Alcohol use: Not Currently   Drug use: Never   Sexual activity: Not Currently    Birth control/protection: None  Other Topics Concern   Not on file  Social History Narrative   Right handed   retired   Chief Executive Officer Drivers of Corporate investment banker Strain: Low Risk  (03/01/2024)   Overall Financial Resource Strain (CARDIA)    Difficulty of Paying Living Expenses: Not hard at all  Food Insecurity: No Food Insecurity (03/01/2024)   Hunger Vital Sign    Worried About Running Out of Food in the Last Year: Never true    Ran Out of Food in the Last Year: Never true  Transportation Needs: No Transportation Needs (03/01/2024)   PRAPARE - Administrator, Civil Service (Medical): No    Lack of Transportation (Non-Medical): No  Physical Activity: Insufficiently Active (03/01/2024)   Exercise Vital Sign    Days of Exercise per Week: 2 days    Minutes of Exercise per Session: 60 min  Stress: No Stress Concern Present (03/01/2024)   [employer] of Occupational Health - Occupational Stress Questionnaire    Feeling of Stress : Not at all  Social Connections: Socially Integrated (03/01/2024)   Social Connection and Isolation Panel [NHANES]    Frequency of Communication with Friends and Family: Twice a week    Frequency of Social Gatherings with Friends and Family: Once a week    Attends Religious Services: More than 4 times per year    Active Member of [employer] or Organizations: No    Attends Engineer, structural: More than 4 times per year    Marital Status: Married    Tobacco Counseling Counseling given: Not Answered   Clinical Intake:  Pre-visit preparation completed:  Yes  Pain : 0-10 Pain Score: 2  Pain Type: Chronic pain Pain Location: Back     Diabetes: No  How often do you need to have someone help you when you read instructions, pamphlets, or other written materials from your doctor or pharmacy?: 1 - Never  Interpreter Needed?: No  Information entered by :: Remi Haggard LPN   Activities of Daily Living    03/01/2024    8:55 AM 02/29/2024    9:27 AM  In your present state of health, do you have any difficulty performing the following activities:  Hearing? 0 0  Vision? 0 0  Difficulty concentrating or making decisions? 0 0  Walking or climbing stairs? 0 0  Dressing or  bathing? 0 0  Doing errands, shopping? 0 0  Preparing Food and eating ? N N  Using the Toilet? N N  In the past six months, have you accidently leaked urine? N N  Do you have problems with loss of bowel control? N N  Managing your Medications? N N  Managing your Finances? N N  Housekeeping or managing your Housekeeping? N N    Patient Care Team: Shade Flood, MD as PCP - General (Family Medicine) [name], MD as Consulting Physician (Hematology) Tat, Octaviano Batty, DO as Consulting Physician (Neurology)  Indicate any recent Medical Services you may have received from other than [facility] providers in the past year (date may be approximate).     Assessment:   This is a routine wellness examination for [place].  Hearing/Vision screen Hearing Screening - Comments:: No trouble hearing Vision Screening - Comments:: Not up to date Looking for a new one   Goals Addressed             This Visit's Progress    Patient Stated       Closer to God Improve golf game Travel more       Depression Screen    03/01/2024    8:56 AM 11/12/2023   10:33 AM 04/16/2023    1:52 PM 02/18/2023    8:45 AM 11/10/2022    8:17 AM 08/25/2022    4:00 PM 05/09/2022    8:01 AM  PHQ 2/9 Scores  PHQ - 2 Score 0 0 0 0 0 0 0  PHQ- 9 Score 0 0 0 0 2 1     Fall Risk     03/01/2024    8:53 AM 02/29/2024    9:27 AM 11/13/2023   11:14 AM 11/12/2023   10:33 AM 07/01/2023    9:46 AM  Fall Risk   Falls in the past year? 0 0 0 0 0  Number falls in past yr: 0 0 0 0 0  Injury with Fall? 0  0 0 0  Risk for fall due to :    No Fall Risks   Follow up Falls evaluation completed;Education provided;Falls prevention discussed  Falls evaluation completed Falls evaluation completed Falls evaluation completed    MEDICARE RISK AT HOME: Medicare Risk at Home Any stairs in or around the home?: Yes If so, are there any without handrails?: No Home free of loose throw rugs in walkways, pet beds, electrical cords, etc?: Yes Adequate lighting in your home to reduce risk of falls?: Yes Life alert?: No Use of a cane, walker or w/c?: No Grab bars in the bathroom?: No Shower chair or bench in shower?: No Elevated toilet seat or a handicapped toilet?: Yes  TIMED UP AND GO:  Was the test performed?  No    Cognitive Function:        03/01/2024    8:54 AM 02/18/2023    8:42 AM  6CIT Screen  What Year? 0 points 0 points  What month? 0 points 0 points  What time? 0 points 0 points  Count back from 20 0 points 0 points  Months in reverse 0 points 0 points  Repeat phrase 0 points 0 points  Total Score 0 points 0 points    Immunizations Immunization History  Administered Date(s) Administered   Fluad Quad(high Dose 65+) 08/25/2022, 07/27/2023   Influenza-Unspecified 08/13/2021   Moderna SARS-COV2 Booster Vaccination 06/26/2022   PFIZER(Purple Top)SARS-COV-2 Vaccination 12/21/2019, 01/11/2020, 08/20/2020, 01/23/2021   Pfizer Covid-19  Vaccine Bivalent Booster 7yrs & up 10/08/2022   Pneumococcal Conjugate-13 08/14/2015   Pneumococcal Polysaccharide-23 10/21/2013, 02/04/2016, 02/23/2021   RSV,unspecified 09/01/2022   Tdap 07/20/2014   Zoster Recombinant(Shingrix) 08/17/2020   Zoster, Unspecified 07/28/2020, 09/03/2022    TDAP status: Up to date  Flu Vaccine status: Up  to date  Pneumococcal vaccine status: Up to date  Covid-19 vaccine status: Information provided on how to obtain vaccines.   Qualifies for Shingles Vaccine? No   Zostavax completed Yes   Shingrix Completed?: Yes  Screening Tests Health Maintenance  Topic Date Due   Hepatitis C Screening  Never done   COVID-19 Vaccine (6 - 2024-25 season) 07/26/2023   INFLUENZA VACCINE  06/24/2024   DTaP/Tdap/Td (2 - Td or Tdap) 07/20/2024   Medicare Annual Wellness (AWV)  03/01/2025   Pneumonia Vaccine 29+ Years old  Completed   Zoster Vaccines- Shingrix  Completed   HPV VACCINES  Aged Out    Health Maintenance  Health Maintenance Due  Topic Date Due   Hepatitis C Screening  Never done   COVID-19 Vaccine (6 - 2024-25 season) 07/26/2023    Colorectal cancer screening: No longer required.   Lung Cancer Screening: (Low Dose CT Chest recommended if Age 42-80 years, 20 pack-year currently smoking OR have quit w/in 15years.) does not qualify.   Lung Cancer Screening Referral:  Additional Screening:  Hepatitis C Screening: does qualify  Vision Screening: Recommended annual ophthalmology exams for early detection of glaucoma and other disorders of the eye. Is the patient up to date with their annual eye exam?  No  Who is the provider or what is the name of the office in which the patient attends annual eye exams? Is in process of getting a new eye doctor If pt is not established with a provider, would they like to be referred to a provider to establish care? No .   Dental Screening: Recommended annual dental exams for proper oral hygiene    Community Resource Referral / Chronic Care Management: CRR required this visit?  No   CCM required this visit?  No     Plan:     I have personally reviewed and noted the following in the patient's chart:   Medical and social history Use of alcohol, tobacco or illicit drugs  Current medications and supplements including opioid prescriptions.  Patient is not currently taking opioid prescriptions. Functional ability and status Nutritional status Physical activity Advanced directives List of other physicians Hospitalizations, surgeries, and ER visits in previous 12 months Vitals Screenings to include cognitive, depression, and falls Referrals and appointments  In addition, I have reviewed and discussed with patient certain preventive protocols, quality metrics, and best practice recommendations. A written personalized care plan for preventive services as well as general preventive health recommendations were provided to patient.     Remi Haggard, LPN   3/[id number]/[id number]   After Visit Summary: (MyChart) Due to this being a telephonic visit, the after visit summary with patients personalized plan was offered to patient via MyChart   Nurse Notes:

## 2024-03-02 ENCOUNTER — Other Ambulatory Visit: Payer: Self-pay | Admitting: Family Medicine

## 2024-03-02 DIAGNOSIS — E559 Vitamin D deficiency, unspecified: Secondary | ICD-10-CM

## 2024-03-17 ENCOUNTER — Other Ambulatory Visit: Payer: Self-pay | Admitting: Family Medicine

## 2024-03-17 DIAGNOSIS — I10 Essential (primary) hypertension: Secondary | ICD-10-CM

## 2024-03-17 NOTE — Telephone Encounter (Signed)
 Last visit in December.  Blood pressure discussed at that time.  Olmesartan  40 mg daily.  Please schedule visit with me in the next 2 to 3 months to review meds and labs.  I refilled meds for now.  Thanks

## 2024-03-17 NOTE — Telephone Encounter (Signed)
LVM informing pt of message below.

## 2024-03-22 ENCOUNTER — Other Ambulatory Visit: Payer: Self-pay | Admitting: Hematology

## 2024-03-22 DIAGNOSIS — C9000 Multiple myeloma not having achieved remission: Secondary | ICD-10-CM

## 2024-03-23 ENCOUNTER — Other Ambulatory Visit: Payer: Self-pay | Admitting: Hematology

## 2024-03-23 DIAGNOSIS — C9000 Multiple myeloma not having achieved remission: Secondary | ICD-10-CM

## 2024-03-28 ENCOUNTER — Other Ambulatory Visit: Payer: Self-pay | Admitting: *Deleted

## 2024-03-28 DIAGNOSIS — C9000 Multiple myeloma not having achieved remission: Secondary | ICD-10-CM

## 2024-03-29 NOTE — Progress Notes (Signed)
 Assessment/Plan:    1.  Essential Tremor  Continue propranolol  10 mg bid.  Higher dosages with bradycardia, although bradycardia about the same today as previously when he was on higher dosages of propranolol  (20 mg twice per day).    -cannot be on primidone due to eliquis   2.  MM  -following with hematology  3.  Chemo induced PN  -pt happy with gabapentin  300 mg bid  -discussed exercises for balance  -discussed compression socks  -refer for PT  4.  F/u 9 months  Subjective:   Nathan Martinez was seen today in follow up for essential tremor.  My previous records were reviewed prior to todays visit.  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  We increased his gabapentin  to 300 mg in the morning and 100 mg in the evening for 1 week and I told him if he did well with that he could stay there and if not he could go to 300 mg twice per day.  He reports today that he is doing well with this and the pain has resolved.  He is wearing Nathan Martinez and they help the sensation in the feet.  Asks what else he can do to support balance.  He remains on propranolol , 10 mg twice per day for tremor.  He finds that helpful  Current prescribed movement disorder medications: Propranolol , 20 mg, half tablet twice per day Gabapentin , 300 mg twice per day (started last visit)  PREVIOUS MEDICATIONS: Cannot be on primidone because of Eliquis   ALLERGIES:  No Known Allergies  CURRENT MEDICATIONS:  Current Meds  Medication Sig   amLODipine  (NORVASC ) 5 MG tablet Take 1 tablet (5 mg total) by mouth daily.   atorvastatin  (LIPITOR) 20 MG tablet Take 1 tablet (20 mg total) by mouth at bedtime.   ELIQUIS  5 MG TABS tablet Take 1 tablet by mouth twice daily   FERREX 150 150 MG capsule Take 1 capsule by mouth once daily   gabapentin  (NEURONTIN ) 300 MG capsule Take 1 capsule (300 mg total) by mouth 2 (two) times daily.   Krill Oil (OMEGA-3) 500 MG CAPS     latanoprost (XALATAN) 0.005 % ophthalmic solution SMARTSIG:1 Drop(s) In Eye(s) Every Evening   Magnesium Oxide 400 MG CAPS    Multiple Vitamins-Minerals (MULTIVITAMIN MEN 50+) TABS Take by mouth.   olmesartan  (BENICAR ) 40 MG tablet Take 1 tablet by mouth once daily   propranolol  (INDERAL ) 10 MG tablet Take 1 tablet by mouth twice daily   REVLIMID  10 MG capsule TAKE 1 CAPSULE BY MOUTH 1 TIME A DAY FOR 21 DAYS ON THEN 7 DAYS OFF   Vitamin D , Ergocalciferol , (DRISDOL ) 1.25 MG (50000 UNIT) CAPS capsule Take 1 capsule by mouth once a week      Objective:    PHYSICAL EXAMINATION:    VITALS:   Vitals:   04/05/24 0911  BP: (!) 154/80  Pulse: 69  SpO2: 98%  Weight: 231 lb 6.4 oz (105 kg)  Height: 6' (1.829 m)    GEN:  The patient appears stated age and is in NAD. HEENT:  Normocephalic, atraumatic.  The mucous membranes are moist. The superficial temporal arteries are without ropiness or tenderness. CV:  RRR Lungs:  CTAB Neck/HEME:  There are no carotid bruits bilaterally.  Neurological examination:  Orientation: The patient is alert and oriented x3. Cranial nerves: There is good facial symmetry. The speech is fluent and clear. Soft palate rises symmetrically and  there is no tongue deviation. Hearing is intact to conversational tone. Sensation: Sensation is intact to light touch throughout Motor: Strength is at least antigravity x4.  Movement examination: Tone: There is normal tone in the UE/LE Abnormal movements: mild postural tremor and intention tremor, mostly on the R and no significant on the L.   Gait and Station: The patient has no difficulty arising out of a deep-seated chair without the use of the hands. The patient's stride length is good but he is a bit forward flexed I have reviewed and interpreted the following labs independently   Chemistry      Component Value Date/Time   NA 138 01/06/2024 1209   K 4.4 01/06/2024 1209   CL 106 01/06/2024 1209   CO2 30 01/06/2024  1209   BUN 16 01/06/2024 1209   CREATININE 1.13 01/06/2024 1209      Component Value Date/Time   CALCIUM  10.2 01/06/2024 1209   CALCIUM  10.7 (H) 12/12/2021 1225   ALKPHOS 32 (L) 01/06/2024 1209   AST 23 01/06/2024 1209   ALT 23 01/06/2024 1209   BILITOT 0.8 01/06/2024 1209      Lab Results  Component Value Date   WBC 2.5 (L) 01/06/2024   HGB 14.2 01/06/2024   HCT 43.6 01/06/2024   MCV 84.8 01/06/2024   PLT 114 (L) 01/06/2024   Lab Results  Component Value Date   TSH 1.295 12/12/2021     Chemistry      Component Value Date/Time   NA 138 01/06/2024 1209   K 4.4 01/06/2024 1209   CL 106 01/06/2024 1209   CO2 30 01/06/2024 1209   BUN 16 01/06/2024 1209   CREATININE 1.13 01/06/2024 1209      Component Value Date/Time   CALCIUM  10.2 01/06/2024 1209   CALCIUM  10.7 (H) 12/12/2021 1225   ALKPHOS 32 (L) 01/06/2024 1209   AST 23 01/06/2024 1209   ALT 23 01/06/2024 1209   BILITOT 0.8 01/06/2024 1209     Lab Results  Component Value Date   VITAMINB12 399 05/13/2023   Lab Results  Component Value Date   HGBA1C 5.7 (H) 11/13/2023   Total time spent on today's visit was 30 minutes, including both face-to-face time and nonface-to-face time.  Time included that spent on review of records (prior notes available to me/labs/imaging if pertinent), discussing treatment and goals, answering patient's questions and coordinating care.   Cc:  Benjiman Bras, MD

## 2024-03-30 ENCOUNTER — Ambulatory Visit: Payer: Self-pay | Admitting: Neurology

## 2024-03-30 ENCOUNTER — Other Ambulatory Visit: Payer: Self-pay | Admitting: Family Medicine

## 2024-03-30 DIAGNOSIS — G250 Essential tremor: Secondary | ICD-10-CM

## 2024-03-30 NOTE — Telephone Encounter (Signed)
 Chart reviewed, discussed with his neurologist on 11/13/2023.  Propranolol  refilled.

## 2024-04-05 ENCOUNTER — Inpatient Hospital Stay: Payer: Medicare Other

## 2024-04-05 ENCOUNTER — Encounter: Payer: Self-pay | Admitting: Neurology

## 2024-04-05 ENCOUNTER — Inpatient Hospital Stay: Payer: Medicare Other | Attending: Hematology | Admitting: Hematology

## 2024-04-05 ENCOUNTER — Ambulatory Visit (INDEPENDENT_AMBULATORY_CARE_PROVIDER_SITE_OTHER): Payer: Medicare Other | Admitting: Neurology

## 2024-04-05 VITALS — BP 154/80 | HR 69 | Ht 72.0 in | Wt 231.4 lb

## 2024-04-05 VITALS — BP 162/66 | HR 56 | Temp 97.7°F | Resp 20 | Wt 231.2 lb

## 2024-04-05 DIAGNOSIS — C9000 Multiple myeloma not having achieved remission: Secondary | ICD-10-CM

## 2024-04-05 DIAGNOSIS — G250 Essential tremor: Secondary | ICD-10-CM | POA: Diagnosis not present

## 2024-04-05 DIAGNOSIS — Z79899 Other long term (current) drug therapy: Secondary | ICD-10-CM | POA: Diagnosis not present

## 2024-04-05 DIAGNOSIS — G620 Drug-induced polyneuropathy: Secondary | ICD-10-CM | POA: Diagnosis not present

## 2024-04-05 DIAGNOSIS — Z5111 Encounter for antineoplastic chemotherapy: Secondary | ICD-10-CM | POA: Diagnosis not present

## 2024-04-05 DIAGNOSIS — T451X5A Adverse effect of antineoplastic and immunosuppressive drugs, initial encounter: Secondary | ICD-10-CM | POA: Diagnosis not present

## 2024-04-05 LAB — CMP (CANCER CENTER ONLY)
ALT: 25 U/L (ref 0–44)
AST: 24 U/L (ref 15–41)
Albumin: 3.7 g/dL (ref 3.5–5.0)
Alkaline Phosphatase: 31 U/L — ABNORMAL LOW (ref 38–126)
Anion gap: 2 — ABNORMAL LOW (ref 5–15)
BUN: 13 mg/dL (ref 8–23)
CO2: 28 mmol/L (ref 22–32)
Calcium: 9.5 mg/dL (ref 8.9–10.3)
Chloride: 109 mmol/L (ref 98–111)
Creatinine: 1.04 mg/dL (ref 0.61–1.24)
GFR, Estimated: >60 mL/min (ref >60)
Glucose, Bld: 100 mg/dL — ABNORMAL HIGH (ref 70–99)
Potassium: 3.9 mmol/L (ref 3.5–5.1)
Sodium: 139 mmol/L (ref 135–145)
Total Bilirubin: 0.8 mg/dL (ref 0.0–1.2)
Total Protein: 6.4 g/dL — ABNORMAL LOW (ref 6.5–8.1)

## 2024-04-05 LAB — CBC WITH DIFFERENTIAL (CANCER CENTER ONLY)
Abs Immature Granulocytes: 0.01 10*3/uL (ref 0.00–0.07)
Basophils Absolute: 0 10*3/uL (ref 0.0–0.1)
Basophils Relative: 1 %
Eosinophils Absolute: 0.1 10*3/uL (ref 0.0–0.5)
Eosinophils Relative: 2 %
HCT: 39.1 % (ref 39.0–52.0)
Hemoglobin: 13.3 g/dL (ref 13.0–17.0)
Immature Granulocytes: 0 %
Lymphocytes Relative: 36 %
Lymphs Abs: 0.9 10*3/uL (ref 0.7–4.0)
MCH: 27.9 pg (ref 26.0–34.0)
MCHC: 34 g/dL (ref 30.0–36.0)
MCV: 82.1 fL (ref 80.0–100.0)
Monocytes Absolute: 0.3 10*3/uL (ref 0.1–1.0)
Monocytes Relative: 13 %
Neutro Abs: 1.2 10*3/uL — ABNORMAL LOW (ref 1.7–7.7)
Neutrophils Relative %: 48 %
Platelet Count: 133 10*3/uL — ABNORMAL LOW (ref 150–400)
RBC: 4.76 MIL/uL (ref 4.22–5.81)
RDW: 14.6 % (ref 11.5–15.5)
WBC Count: 2.6 10*3/uL — ABNORMAL LOW (ref 4.0–10.5)
nRBC: 0 % (ref 0.0–0.2)

## 2024-04-05 MED ORDER — SODIUM CHLORIDE 0.9 % IV SOLN: INTRAVENOUS | Status: DC

## 2024-04-05 MED ORDER — ZOLEDRONIC ACID 4 MG/100ML IV SOLN
4.0000 mg | Freq: Once | INTRAVENOUS | Status: AC
  Administered 2024-04-05: 4 mg via INTRAVENOUS
  Filled 2024-04-05: qty 100

## 2024-04-05 NOTE — Progress Notes (Signed)
 HEMATOLOGY/ONCOLOGY CLINIC VISIT NOTE   Date of Service: 04/05/24  Patient Care Team: Benjiman Bras, MD as PCP - General (Family Medicine) Frankie Israel, MD as Consulting Physician (Hematology) Tat, Von Grumbling, DO as Consulting Physician (Neurology)  CHIEF COMPLAINTS/PURPOSE OF CONSULTATION:  Follow-up for continued evaluation and management of multiple myeloma  HISTORY OF PRESENTING ILLNESS:  Please see previous note for details of initial presentation  INTERVAL HISTORY  Nathan Martinez is a 78 y.o. male here for continued evaluation and management of multiple myeloma.  Patient was last seen by me on 01/06/2024 and reported neuropathy in his feet fluctuating in severity.   Patient notes no acute new symptoms since his last clinic visit. Some b/l lower extremity swelling on and off. No fevers/chills/nightsweats. No focal new bone pains, Has been busy with his garden. In good spirits overall.    MEDICAL HISTORY:  Intermittent hypercalcemia - primary hyperparathyroidism status post parathyroidectomy in 2019 HTN HLD Glaucoma History of Helicobacter pylori infection previously treated Chronic leukopenia/neutropenia History of present iron  deficiency anemia Acute DVT  SURGICAL HISTORY: Rt TKA 2018 Parathyroidectomy 2019 B/l cataracts sx 2021  SOCIAL HISTORY: Social History   Socioeconomic History   Marital status: Married    Spouse name: Not on file   Number of children: Not on file   Years of education: Not on file   Highest education level: Bachelor's degree (e.g., BA, AB, BS)  Occupational History   Occupation: retired    Comment: Manufacturing systems engineer  Tobacco Use   Smoking status: Never   Smokeless tobacco: Never  Vaping Use   Vaping status: Never Used  Substance and Sexual Activity   Alcohol use: Not Currently   Drug use: Never   Sexual activity: Not Currently    Birth control/protection: None  Other Topics Concern   Not on file  Social  History Narrative   Right handed   retired   Chief Executive Officer Drivers of Corporate investment banker Strain: Low Risk  (03/01/2024)   Overall Financial Resource Strain (CARDIA)    Difficulty of Paying Living Expenses: Not hard at all  Food Insecurity: No Food Insecurity (03/01/2024)   Hunger Vital Sign    Worried About Running Out of Food in the Last Year: Never true    Ran Out of Food in the Last Year: Never true  Transportation Needs: No Transportation Needs (03/01/2024)   PRAPARE - Administrator, Civil Service (Medical): No    Lack of Transportation (Non-Medical): No  Physical Activity: Insufficiently Active (03/01/2024)   Exercise Vital Sign    Days of Exercise per Week: 2 days    Minutes of Exercise per Session: 60 min  Stress: No Stress Concern Present (03/01/2024)   [employer] of Occupational Health - Occupational Stress Questionnaire    Feeling of Stress : Not at all  Social Connections: Socially Integrated (03/01/2024)   Social Connection and Isolation Panel [NHANES]    Frequency of Communication with Friends and Family: Twice a week    Frequency of Social Gatherings with Friends and Family: Once a week    Attends Religious Services: More than 4 times per year    Active Member of [employer] or Organizations: No    Attends Engineer, structural: More than 4 times per year    Marital Status: Married  Catering manager Violence: Not At Risk (03/01/2024)   Humiliation, Afraid, Rape, and Kick questionnaire    Fear of Current or Ex-Partner: No  Emotionally Abused: No    Physically Abused: No    Sexually Abused: No  Ex smoker (quit 1976) Ex secret service agent ETOH social use  FAMILY HISTORY: Sister - uterine/ovarian cancer 20's  Mother-- lived into her 75's Father - died in 64's - fall/trauma  ALLERGIES:  has no known allergies.  MEDICATIONS:  Current Outpatient Medications on File Prior to Visit  Medication Sig Dispense Refill   amLODipine  (NORVASC ) 5 MG tablet  Take 1 tablet (5 mg total) by mouth daily. 90 tablet 1   atorvastatin  (LIPITOR) 20 MG tablet Take 1 tablet (20 mg total) by mouth at bedtime. 90 tablet 1   ELIQUIS  5 MG TABS tablet Take 1 tablet by mouth twice daily 60 tablet 0   FERREX 150 150 MG capsule Take 1 capsule by mouth once daily 90 capsule 0   gabapentin  (NEURONTIN ) 300 MG capsule Take 1 capsule (300 mg total) by mouth 2 (two) times daily. 180 capsule 1   Krill Oil (OMEGA-3) 500 MG CAPS      latanoprost (XALATAN) 0.005 % ophthalmic solution SMARTSIG:1 Drop(s) In Eye(s) Every Evening     Magnesium Oxide 400 MG CAPS      Multiple Vitamins-Minerals (MULTIVITAMIN MEN 50+) TABS Take by mouth.     olmesartan  (BENICAR ) 40 MG tablet Take 1 tablet by mouth once daily 90 tablet 1   propranolol  (INDERAL ) 10 MG tablet Take 1 tablet by mouth twice daily 180 tablet 0   REVLIMID  10 MG capsule TAKE 1 CAPSULE BY MOUTH 1 TIME A DAY FOR 21 DAYS ON THEN 7 DAYS OFF 21 capsule 0   Vitamin D , Ergocalciferol , (DRISDOL ) 1.25 MG (50000 UNIT) CAPS capsule Take 1 capsule by mouth once a week 6 capsule 0   No current facility-administered medications on file prior to visit.     REVIEW OF SYSTEMS:    10 Point review of Systems was done is negative except as noted above.   PHYSICAL EXAMINATION: .BP (!) 162/66   Pulse (!) 56   Temp 97.7 F (36.5 C)   Resp 20   Wt 231 lb 3.2 oz (104.9 kg)   SpO2 100%   BMI 31.36 kg/m  GENERAL:alert, in no acute distress and comfortable SKIN: no acute rashes, no significant lesions EYES: conjunctiva are pink and non-injected, sclera anicteric OROPHARYNX: MMM, no exudates, no oropharyngeal erythema or ulceration NECK: supple, no JVD LYMPH:  no palpable lymphadenopathy in the cervical, axillary or inguinal regions LUNGS: clear to auscultation b/l with normal respiratory effort HEART: regular rate & rhythm ABDOMEN:  normoactive bowel sounds , non tender, not distended. Extremity: no pedal edema PSYCH: alert &  oriented x 3 with fluent speech NEURO: no focal motor/sensory deficits    LABORATORY DATA:  I have reviewed the data as listed  .    Latest Ref Rng & Units 04/05/2024    2:04 PM 01/06/2024   12:09 PM 10/07/2023    2:12 PM  CBC  WBC 4.0 - 10.5 K/uL 2.6  2.5  3.3   Hemoglobin 13.0 - 17.0 g/dL 16.1  09.6  04.5   Hematocrit 39.0 - 52.0 % 39.1  43.6  42.4   Platelets 150 - 400 K/uL 133  114  125    CBC    Component Value Date/Time   WBC 2.6 (L) 04/05/2024 1404   WBC 5.1 08/11/2022 0807   RBC 4.76 04/05/2024 1404   HGB 13.3 04/05/2024 1404   HCT 39.1 04/05/2024 1404   PLT 133 (  L) 04/05/2024 1404   MCV 82.1 04/05/2024 1404   MCH 27.9 04/05/2024 1404   MCHC 34.0 04/05/2024 1404   RDW 14.6 04/05/2024 1404   LYMPHSABS 0.9 04/05/2024 1404   MONOABS 0.3 04/05/2024 1404   EOSABS 0.1 04/05/2024 1404   BASOSABS 0.0 04/05/2024 1404    .    Latest Ref Rng & Units 01/06/2024   12:09 PM 11/12/2023   11:47 AM 10/07/2023    2:12 PM  CMP  Glucose 70 - 99 mg/dL 95  80  272   BUN 8 - 23 mg/dL 16  13  17    Creatinine 0.61 - 1.24 mg/dL 5.36  6.44  0.34   Sodium 135 - 145 mmol/L 138  137  138   Potassium 3.5 - 5.1 mmol/L 4.4  4.1  3.7   Chloride 98 - 111 mmol/L 106  103  107   CO2 22 - 32 mmol/L 30  30  28    Calcium  8.9 - 10.3 mg/dL 74.2  59.5  63.8   Total Protein 6.5 - 8.1 g/dL 6.8  6.8  6.7   Total Bilirubin 0.0 - 1.2 mg/dL 0.8  1.4  1.0   Alkaline Phos 38 - 126 U/L 32  30  31   AST 15 - 41 U/L 23  27  26    ALT 0 - 44 U/L 23  26  23       IEOP/Immunofixation Order: [id number] Component 9 d ago  PATHOLOGIST CHARGE ID  [id number] - Sunday Eng, M.D.   SERUM IEOP COMMENT   NO DEFINITE CLONALITY DETECTED.   Reviewed and Interpreted By   Sunday Eng, M.D.       RADIOGRAPHIC STUDIES:   US  lower extremity venous ([id number]) done 08/19/2022 revealed "Summary:  RIGHT:  - Findings consistent with acute deep vein thrombosis involving the right  proximal,  mid, and distal femoral vein, right popliteal vein, and right  peroneal veins.  - No cystic structure found in the popliteal fossa.     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 Most recent PET restaging scan done 08/13/2022 showed no overt signs of disease progression. Myeloma FISH panel no reported mutations. Cytogenetics-loss of Y chromosome.  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 Recent labs have shown resolution of his leukopenia/neutropenia.  We will continue to monitor on Revlimid .  3) h/o DVT of the right lower extremity Ultrasound right lower extremity 08/19/2022- Findings consistent with acute deep vein thrombosis involving the right  proximal, mid, and distal femoral vein, right popliteal vein, and right  peroneal veins.  Resolved and continues to be on anticoagulation.  4) history of stye -continue f/u with ophthamology Currently not an acute issue and has resolved since the patient has been off Velcade .  PLAN:  -discussed lab results from today, 04/05/24, in detail with patient CBC shows mild leucopenia/netropenia CMP stable Myeloma panel with neg  for M spike but IFE still with IgG Kappa monoclonal protein. -SFLC wnl -continue maintenance Revlinid @ 10mg  po daily 3W on 1w off. -continues to be on  eliquis  -resolving neuropathy. -continue maintenance Zometa ,  FOLLOW-UP: RTC with Dr Salomon Cree with labs in 3 months with next dose of Zometa     The total  time spent in the appointment was 21 minutes* .  All of the patient's questions were answered with apparent satisfaction. The patient knows to call the clinic with any problems, questions or concerns.   Jacquelyn Matt MD MS AAHIVMS [facility] Hematology/Oncology Physician [facility]  .*Total Encounter Time as defined by the Centers for Medicare and Medicaid Services includes, in addition to the face-to-face time of a patient visit (documented in the note above) non-face-to-face time: obtaining and reviewing outside history, ordering and reviewing medications, tests or procedures, care coordination (communications with other health care professionals or caregivers) and documentation in the medical record.    I,Mitra Faeizi,acting as a Neurosurgeon for Jacquelyn Matt, MD.,have documented all relevant documentation on the behalf of Jacquelyn Matt, MD,as directed by  Jacquelyn Matt, MD while in the presence of Jacquelyn Matt, MD.  .I have reviewed the above documentation for accuracy and completeness, and I agree with the above. .Tenleigh Byer Kishore Abeeha Twist MD

## 2024-04-05 NOTE — Patient Instructions (Signed)

## 2024-04-05 NOTE — Patient Instructions (Signed)
 Peripheral Neuropathy Peripheral neuropathy is a type of nerve damage. It affects nerves that carry signals between the spinal cord and the arms, legs, and the rest of the body (peripheral nerves). It does not affect nerves in the spinal cord or brain. In peripheral neuropathy, one nerve or a group of nerves may be damaged. Peripheral neuropathy is a broad category that includes many specific nerve disorders, like diabetic neuropathy, hereditary neuropathy, and carpal tunnel syndrome. What are the causes? This condition may be caused by: Certain diseases, such as: Diabetes. This is the most common cause of peripheral neuropathy. Autoimmune diseases, such as rheumatoid arthritis and systemic lupus erythematosus. Nerve diseases that are passed from parent to child (inherited). Kidney disease. Thyroid disease. Other causes may include: Nerve injury. Pressure or stress on a nerve that lasts a long time. Lack (deficiency) of B vitamins. This can result from alcoholism, poor diet, or a restricted diet. Infections. Some medicines, such as cancer medicines (chemotherapy). Poisonous (toxic) substances, such as lead and mercury. Too little blood flowing to the legs. In some cases, the cause of this condition is not known. What are the signs or symptoms? Symptoms of this condition depend on which of your nerves is damaged. Symptoms in the legs, hands, and arms can include: Loss of feeling (numbness) in the feet, hands, or both. Tingling in the feet, hands, or both. Burning pain. Very sensitive skin. Weakness. Not being able to move a part of the body (paralysis). Clumsiness or poor coordination. Muscle twitching. Loss of balance. Symptoms in other parts of the body can include: Not being able to control your bladder. Feeling dizzy. Sexual problems. How is this diagnosed? Diagnosing and finding the cause of peripheral neuropathy can be difficult. Your health care provider will take your  medical history and do a physical exam. A neurological exam will also be done. This involves checking things that are affected by your brain, spinal cord, and nerves (nervous system). For example, your health care provider will check your reflexes, how you move, and what you can feel. You may have other tests, such as: Blood tests. Electromyogram (EMG) and nerve conduction tests. These tests check nerve function and how well the nerves are controlling the muscles. Imaging tests, such as a CT scan or MRI, to rule out other causes of your symptoms. Removing a small piece of nerve to be examined in a lab (nerve biopsy). Removing and examining a small amount of the fluid that surrounds the brain and spinal cord (lumbar puncture). How is this treated? Treatment for this condition may involve: Treating the underlying cause of the neuropathy, such as diabetes, kidney disease, or vitamin deficiencies. Stopping medicines that can cause neuropathy, such as chemotherapy. Medicine to help relieve pain. Medicines may include: Prescription or over-the-counter pain medicine. Anti-seizure medicine. Antidepressants. Pain-relieving patches that are applied to painful areas of skin. Surgery to relieve pressure on a nerve or to destroy a nerve that is causing pain. Physical therapy to help improve movement and balance. Devices to help you move around (assistive devices). Follow these instructions at home: Medicines Take over-the-counter and prescription medicines only as told by your health care provider. Do not take any other medicines without first asking your health care provider. Ask your health care provider if the medicine prescribed to you requires you to avoid driving or using machinery. Lifestyle  Do not use any products that contain nicotine or tobacco. These products include cigarettes, chewing tobacco, and vaping devices, such as e-cigarettes. Smoking keeps  blood from reaching damaged nerves. If you  need help quitting, ask your health care provider. Avoid or limit alcohol. Too much alcohol can cause a vitamin B deficiency, and vitamin B is needed for healthy nerves. Eat a healthy diet. This includes: Eating foods that are high in fiber, such as beans, whole grains, and fresh fruits and vegetables. Limiting foods that are high in fat and processed sugars, such as fried or sweet foods. General instructions  If you have diabetes, work closely with your health care provider to keep your blood sugar under control. If you have numbness in your feet: Check every day for signs of injury or infection. Watch for redness, warmth, and swelling. Wear padded socks and comfortable shoes. These help protect your feet. Develop a good support system. Living with peripheral neuropathy can be stressful. Consider talking with a mental health specialist or joining a support group. Use assistive devices and attend physical therapy as told by your health care provider. This may include using a walker or a cane. Keep all follow-up visits. This is important. Where to find more information [employer] of Neurological Disorders: [employer] Contact a health care provider if: You have new signs or symptoms of peripheral neuropathy. You are struggling emotionally from dealing with peripheral neuropathy. Your pain is not well controlled. Get help right away if: You have an injury or infection that is not healing normally. You develop new weakness in an arm or leg. You have fallen or do so frequently. Summary Peripheral neuropathy is when the nerves in the arms or legs are damaged, resulting in numbness, weakness, or pain. There are many causes of peripheral neuropathy, including diabetes, pinched nerves, vitamin deficiencies, autoimmune disease, and hereditary conditions. Diagnosing and finding the cause of peripheral neuropathy can be difficult. Your health care provider will take your medical  history, do a physical exam, and do tests, including blood tests and nerve function tests. Treatment involves treating the underlying cause of the neuropathy and taking medicines to help control pain. Physical therapy and assistive devices may also help. This information is not intended to replace advice given to you by your health care provider. Make sure you discuss any questions you have with your health care provider. Document Revised: 07/16/2021 Document Reviewed: 07/16/2021 Elsevier Patient Education  2024 [employer].

## 2024-04-06 LAB — KAPPA/LAMBDA LIGHT CHAINS
Kappa free light chain: 21.7 mg/L — ABNORMAL HIGH (ref 3.3–19.4)
Kappa, lambda light chain ratio: 1.41 (ref 0.26–1.65)
Lambda free light chains: 15.4 mg/L (ref 5.7–26.3)

## 2024-04-07 ENCOUNTER — Telehealth: Payer: Self-pay | Admitting: Hematology

## 2024-04-07 NOTE — Telephone Encounter (Signed)
 Confirmed with pt scheduled appt dates and times

## 2024-04-08 LAB — MULTIPLE MYELOMA PANEL, SERUM
Albumin SerPl Elph-Mcnc: 3.3 g/dL (ref 2.9–4.4)
Albumin/Glob SerPl: 1.3 (ref 0.7–1.7)
Alpha 1: 0.2 g/dL (ref 0.0–0.4)
Alpha2 Glob SerPl Elph-Mcnc: 0.5 g/dL (ref 0.4–1.0)
B-Globulin SerPl Elph-Mcnc: 0.8 g/dL (ref 0.7–1.3)
Gamma Glob SerPl Elph-Mcnc: 1.2 g/dL (ref 0.4–1.8)
Globulin, Total: 2.7 g/dL (ref 2.2–3.9)
IgA: 159 mg/dL (ref 61–437)
IgG (Immunoglobin G), Serum: 1372 mg/dL (ref 603–1613)
IgM (Immunoglobulin M), Srm: 16 mg/dL (ref 15–143)
Total Protein ELP: 6 g/dL (ref 6.0–8.5)

## 2024-04-12 ENCOUNTER — Encounter: Payer: Self-pay | Admitting: Hematology

## 2024-04-12 ENCOUNTER — Other Ambulatory Visit: Payer: Self-pay | Admitting: Family Medicine

## 2024-04-12 DIAGNOSIS — E559 Vitamin D deficiency, unspecified: Secondary | ICD-10-CM

## 2024-04-13 ENCOUNTER — Ambulatory Visit: Admitting: Physical Therapy

## 2024-04-19 ENCOUNTER — Other Ambulatory Visit: Payer: Self-pay | Admitting: Hematology

## 2024-04-19 DIAGNOSIS — C9000 Multiple myeloma not having achieved remission: Secondary | ICD-10-CM

## 2024-04-28 ENCOUNTER — Other Ambulatory Visit: Payer: Self-pay | Admitting: Family Medicine

## 2024-04-28 DIAGNOSIS — D472 Monoclonal gammopathy: Secondary | ICD-10-CM

## 2024-04-28 DIAGNOSIS — E559 Vitamin D deficiency, unspecified: Secondary | ICD-10-CM

## 2024-05-02 ENCOUNTER — Other Ambulatory Visit: Payer: Self-pay | Admitting: Family Medicine

## 2024-05-02 DIAGNOSIS — E559 Vitamin D deficiency, unspecified: Secondary | ICD-10-CM

## 2024-05-02 NOTE — Telephone Encounter (Unsigned)
 Copied from CRM [id number]. Topic: Clinical - Medication Refill >> May 02, 2024 10:32 AM Clyde Darling P wrote: Medication: Vitamin D , Ergocalciferol , (DRISDOL ) 1.25 MG (50000 UNIT) CAPS capsule  Has the patient contacted their pharmacy? Yes (Agent: If no, request that the patient contact the pharmacy for the refill. If patient does not wish to contact the pharmacy document the reason why and proceed with request.) (Agent: If yes, when and what did the pharmacy advise?)  This is the patient's preferred pharmacy:  [facility] [address], [place] - [address] [place] [address] Phone: [phone] Fax: [phone]  Is this the correct pharmacy for this prescription? Yes If no, delete pharmacy and type the correct one.   Has the prescription been filled recently? No  Is the patient out of the medication? Yes  Has the patient been seen for an appointment in the last year OR does the patient have an upcoming appointment? Yes  Can we respond through MyChart? Yes  Agent: Please be advised that Rx refills may take up to 3 business days. We ask that you follow-up with your pharmacy.

## 2024-05-03 MED ORDER — VITAMIN D (ERGOCALCIFEROL) 1.25 MG (50000 UNIT) PO CAPS
50000.0000 [IU] | ORAL_CAPSULE | ORAL | 1 refills | Status: DC
Start: 2024-05-03 — End: 2024-10-11

## 2024-05-03 NOTE — Telephone Encounter (Signed)
 Last vitamin D  Lab Results  Component Value Date   VD25OH 33.05 05/13/2023  Vitamin D  every 2 weeks when discussed on last labs.  Refilled.

## 2024-05-10 ENCOUNTER — Other Ambulatory Visit: Payer: Self-pay | Admitting: Neurology

## 2024-05-11 ENCOUNTER — Other Ambulatory Visit: Payer: Self-pay | Admitting: Family Medicine

## 2024-05-11 DIAGNOSIS — D472 Monoclonal gammopathy: Secondary | ICD-10-CM

## 2024-05-18 ENCOUNTER — Other Ambulatory Visit: Payer: Self-pay | Admitting: Hematology

## 2024-05-18 DIAGNOSIS — C9000 Multiple myeloma not having achieved remission: Secondary | ICD-10-CM

## 2024-05-20 ENCOUNTER — Encounter: Payer: Self-pay | Admitting: Hematology

## 2024-05-23 ENCOUNTER — Other Ambulatory Visit: Payer: Self-pay | Admitting: Hematology

## 2024-05-23 DIAGNOSIS — C9000 Multiple myeloma not having achieved remission: Secondary | ICD-10-CM

## 2024-06-03 DIAGNOSIS — D472 Monoclonal gammopathy: Secondary | ICD-10-CM | POA: Insufficient documentation

## 2024-06-06 ENCOUNTER — Ambulatory Visit (INDEPENDENT_AMBULATORY_CARE_PROVIDER_SITE_OTHER): Admitting: Family Medicine

## 2024-06-06 VITALS — BP 132/68 | HR 68 | Temp 97.8°F | Ht 72.0 in | Wt 230.1 lb

## 2024-06-06 DIAGNOSIS — I10 Essential (primary) hypertension: Secondary | ICD-10-CM | POA: Diagnosis not present

## 2024-06-06 DIAGNOSIS — E785 Hyperlipidemia, unspecified: Secondary | ICD-10-CM

## 2024-06-06 DIAGNOSIS — J3489 Other specified disorders of nose and nasal sinuses: Secondary | ICD-10-CM

## 2024-06-06 DIAGNOSIS — Z1159 Encounter for screening for other viral diseases: Secondary | ICD-10-CM

## 2024-06-06 DIAGNOSIS — G250 Essential tremor: Secondary | ICD-10-CM

## 2024-06-06 DIAGNOSIS — G620 Drug-induced polyneuropathy: Secondary | ICD-10-CM

## 2024-06-06 DIAGNOSIS — C9000 Multiple myeloma not having achieved remission: Secondary | ICD-10-CM

## 2024-06-06 LAB — LIPID PANEL
Cholesterol: 146 mg/dL (ref 0–200)
HDL: 49.8 mg/dL (ref >39.00)
LDL Cholesterol: 75 mg/dL (ref 0–99)
NonHDL: 96.63
Total CHOL/HDL Ratio: 3
Triglycerides: 106 mg/dL (ref 0.0–149.0)
VLDL: 21.2 mg/dL (ref 0.0–40.0)

## 2024-06-06 MED ORDER — ATORVASTATIN CALCIUM 20 MG PO TABS: 20.0000 mg | ORAL_TABLET | Freq: Every day | ORAL | 1 refills | Status: DC

## 2024-06-06 MED ORDER — AMLODIPINE BESYLATE 5 MG PO TABS: 5.0000 mg | ORAL_TABLET | Freq: Every day | ORAL | 1 refills | Status: DC

## 2024-06-06 MED ORDER — OLMESARTAN MEDOXOMIL 40 MG PO TABS: 40.0000 mg | ORAL_TABLET | Freq: Every day | ORAL | 1 refills | Status: DC

## 2024-06-06 MED ORDER — IPRATROPIUM BROMIDE 0.06 % NA SOLN: 1.0000 | Freq: Four times a day (QID) | NASAL | 5 refills | Status: DC

## 2024-06-06 MED ORDER — PROPRANOLOL HCL 10 MG PO TABS: 10.0000 mg | ORAL_TABLET | Freq: Two times a day (BID) | ORAL | 1 refills | Status: DC

## 2024-06-06 NOTE — Progress Notes (Signed)
 Subjective:  Patient ID: Nathan Martinez, male    DOB: 07-02-1946  Age: 78 y.o. MRN: [id number]  CC:  Chief Complaint  Patient presents with   Medical Management of Chronic Issues    Pt is here for labs and med mgnt Pt reports he has noticed sinus drip concerns sinus May, he thinks this is due to air condition at home . Pt is request printed refills?    HPI [name] presents for   Hypertension: Norvasc  5mg  every day, benicar  40mg .  No side effects.  Home readings: 130-150/60-80 range. Better recently.  BP Readings from Last 3 Encounters:  06/06/24 132/68  04/05/24 (!) 162/66  04/05/24 (!) 154/80   Lab Results  Component Value Date   CREATININE 1.04 04/05/2024   Hyperlipidemia: Lipitor 20 mg daily without new myalgias or side effects. Recent CMP at hematology.  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  Had been started on gabapentin , some residual symptoms, referred to neurology, prior essential tremor followed by Dr. Evonnie.  Repeat referral to neurology placed, trial of higher dose of gabapentin  at 200 mg initially then 300 mg if needed when discussed with me in December last year.   Appointment noted in May with Dr. Evonnie.  Continuing on propranolol  10 mg twice daily with history of essential tremor.  Higher doses caused bradycardia previously and cannot be on primidone due to use of Eliquis  (needs to take while on Revlimid ). He was doing well with gabapentin  300 mg twice daily at that time, exercises for balance, compression socks and referred to PT. Has no seen PT - new [place] shoes are working well.  At health club in [place] - exercise, weights helpful.  Gabapentin  300mg  BID - no sedation, working well.   Multiple myeloma Note reviewed from Dr. Onesimo on May 13.  Mild leukopenia, neutropenia.  CMP stable.  Still with IgG kappa monoclonal protein.  Continued on maintenance Revlimid  at 10 mg daily 3 weeks on 1 week off,  continue Eliquis .  Continue maintenance Zometa  and noted resolving neuropathy. 3 month follow up.   Rhinorrhea/sinus congestion Slight drip noted when in house with air conditioning. Slight drip. No cough, face pain, or discolored drainage.  Otc nasal spray - afrin? Uses once at night.   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   Shoulder pain, left 08/10/2018   DDD (degenerative disc disease), lumbar 09/18/2017   Joint stiffness of spine 09/18/2017   Spinal stenosis of lumbar region without neurogenic claudication 09/18/2017   Muscle weakness (generalized) 08/10/2017   Right knee pain 08/10/2017   BPH (benign prostatic hyperplasia) 08/06/2017   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   Status post total knee replacement, 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   Proteinuria  09/30/2016   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   Hypercholesterolemia 12/21/2015   Difficulty in walking, not elsewhere classified 04/20/2015   Osteoarthritis of right hip 04/20/2015   Unilateral primary osteoarthritis, right knee 04/20/2015   Tear of meniscus of right knee 04/20/2015   Weakness generalized 04/20/2015   Primary localized osteoarthrosis, lower leg 03/21/2015   Past Medical History:  Diagnosis Date   DVT (deep  vein thrombosis) in pregnancy    Hyperlipidemia    Hypertension    Multiple myeloma (HCC)    Peripheral neuropathy    Past Surgical History:  Procedure Laterality Date   CATARACT EXTRACTION, BILATERAL     KNEE SURGERY Right 2018   PARATHYROID EXPLORATION     No Known Allergies Prior to Admission medications   Medication Sig Start Date End Date Taking? Authorizing Provider  amLODipine  (NORVASC ) 5 MG tablet Take 1 tablet (5 mg total) by mouth daily. 12/08/23  Yes Levora Reyes SAUNDERS, MD  atorvastatin  (LIPITOR) 20 MG tablet Take 1 tablet (20 mg total) by mouth at bedtime. 11/12/23  Yes Levora Reyes SAUNDERS, MD  ELIQUIS  5 MG TABS tablet Take 1 tablet by mouth twice daily 05/23/24  Yes Kale, Gautam Kishore, MD  FERREX 150 150 MG capsule Take 1 capsule by mouth once daily 05/12/24  Yes Levora Reyes SAUNDERS, MD  gabapentin  (NEURONTIN ) 300 MG capsule Take 1 capsule by mouth twice daily 05/11/24  Yes Tat, Asberry RAMAN, DO  Krill Oil (OMEGA-3) 500 MG CAPS  09/30/16  Yes [provider]  latanoprost (XALATAN) 0.005 % ophthalmic solution SMARTSIG:1 Drop(s) In Eye(s) Every Evening 01/28/22  Yes [provider]  Magnesium Oxide 400 MG CAPS    Yes [provider]  Multiple Vitamins-Minerals (MULTIVITAMIN MEN 50+) TABS Take by mouth.   Yes [provider]  olmesartan  (BENICAR ) 40 MG tablet Take 1 tablet by mouth once daily 03/17/24  Yes Levora Reyes SAUNDERS, MD  propranolol  (INDERAL ) 10 MG tablet Take 1 tablet by mouth twice daily 03/30/24  Yes Levora Reyes SAUNDERS, MD  REVLIMID  10 MG capsule TAKE 1 CAPSULE BY MOUTH 1 TIME A DAY FOR 21 DAYS ON THEN 7 DAYS OFF 05/20/24  Yes Onesimo Emaline Brink, MD  Vitamin D , Ergocalciferol , (DRISDOL ) 1.25 MG (50000 UNIT) CAPS capsule Take 1 capsule (50,000 Units total) by mouth every 14 (fourteen) days. 05/03/24  Yes Levora Reyes SAUNDERS, MD   Social History   Socioeconomic History   Marital status: Married    Spouse name: Not on file   Number of children: Not  on file   Years of education: Not on file   Highest education level: Bachelor's degree (e.g., BA, AB, BS)  Occupational History   Occupation: retired    Comment: Manufacturing systems engineer  Tobacco Use   Smoking status: Never   Smokeless tobacco: Never  Vaping Use   Vaping status: Never Used  Substance and Sexual Activity   Alcohol use: Not Currently   Drug use: Never   Sexual activity: Not Currently    Birth control/protection: None  Other Topics Concern   Not on file  Social History Narrative   Right handed   retired   Chief Executive Officer Drivers of Corporate investment banker Strain: Low Risk  (06/06/2024)   Overall Financial Resource Strain (CARDIA)    Difficulty of Paying Living Expenses: Not hard at all  Food Insecurity: No Food Insecurity (06/06/2024)   Hunger Vital Sign    Worried About Running Out of Food in the Last  Year: Never true    Ran Out of Food in the Last Year: Never true  Transportation Needs: No Transportation Needs (06/06/2024)   PRAPARE - Administrator, Civil Service (Medical): No    Lack of Transportation (Non-Medical): No  Physical Activity: Insufficiently Active (06/06/2024)   Exercise Vital Sign    Days of Exercise per Week: 1 day    Minutes of Exercise per Session: 40 min  Stress: No Stress Concern Present (06/06/2024)   [employer] of Occupational Health - Occupational Stress Questionnaire    Feeling of Stress: Not at all  Social Connections: Socially Integrated (06/06/2024)   Social Connection and Isolation Panel    Frequency of Communication with Friends and Family: More than three times a week    Frequency of Social Gatherings with Friends and Family: More than three times a week    Attends Religious Services: More than 4 times per year    Active Member of [employer] or Organizations: Yes    Attends Banker Meetings: More than 4 times per year    Marital Status: Married  Catering manager Violence: Not At Risk (03/01/2024)   Humiliation,  Afraid, Rape, and Kick questionnaire    Fear of Current or Ex-Partner: No    Emotionally Abused: No    Physically Abused: No    Sexually Abused: No    Review of Systems Per HPI.   Objective:   Vitals:   06/06/24 1001  BP: 132/68  Pulse: 68  Temp: 97.8 F (36.6 C)  SpO2: 98%  Weight: 230 lb 2 oz (104.4 kg)  Height: 6' (1.829 m)     Physical Exam Vitals reviewed.  Constitutional:      Appearance: He is well-developed.  HENT:     Head: Normocephalic and atraumatic.     Nose: No congestion or rhinorrhea.     Mouth/Throat:     Mouth: Mucous membranes are moist.  Neck:     Vascular: No carotid bruit or JVD.  Cardiovascular:     Rate and Rhythm: Normal rate and regular rhythm.     Heart sounds: Normal heart sounds. No murmur heard. Pulmonary:     Effort: Pulmonary effort is normal.     Breath sounds: Normal breath sounds. No rales.  Musculoskeletal:     Right lower leg: No edema.     Left lower leg: No edema.  Skin:    General: Skin is warm and dry.  Neurological:     Mental Status: He is alert and oriented to person, place, and time.  Psychiatric:        Mood and Affect: Mood normal.        Assessment & Plan:  EVIN LOISEAU is a 78 y.o. male . Essential hypertension - Plan: amLODipine  (NORVASC ) 5 MG tablet, olmesartan  (BENICAR ) 40 MG tablet  - Borderline systolic, likely component of systolic hypertension but based on current readings, will continue same med regimen with RTC precautions.  Recent CMP noted.  Hyperlipidemia, unspecified hyperlipidemia type - Plan: atorvastatin  (LIPITOR) 20 MG tablet, Lipid panel  - Tolerating current med regimen, check updated lipid panel.  Drug-induced polyneuropathy (HCC)  - Stable with gabapentin  300 mg twice daily, continue same.  Will be increasing exercise, deferred PT at this time.  Essential tremor - Plan: propranolol  (INDERAL ) 10 MG tablet  - Stable with propranolol , refilled.  Multiple myeloma not having  achieved remission (HCC)  - Followed by hematology with continued maintenance treatment as above.  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  Need for hepatitis C screening test - Plan: Hepatitis C Antibody   Meds ordered this encounter  Medications   atorvastatin  (LIPITOR) 20 MG tablet    Sig: Take 1 tablet (20 mg total) by mouth at bedtime.    Dispense:  90 tablet    Refill:  1   ipratropium (ATROVENT ) 0.06 % nasal spray    Sig: Place 1-2 sprays into both nostrils 4 (four) times daily. As needed for nasal congestion    Dispense:  15 mL    Refill:  5   amLODipine  (NORVASC ) 5 MG tablet    Sig: Take 1 tablet (5 mg total) by mouth daily.    Dispense:  90 tablet    Refill:  1   olmesartan  (BENICAR ) 40 MG tablet    Sig: Take 1 tablet (40 mg total) by mouth daily.    Dispense:  90 tablet    Refill:  1   propranolol  (INDERAL ) 10 MG tablet    Sig: Take 1 tablet (10 mg total) by mouth 2 (two) times daily.    Dispense:  180 tablet    Refill:  1   Patient Instructions  No change in your chronic meds at this time.  Try atrovent  nasal spray for possible vasomotor rhinitis. Stop any over the counter decongestant nasal spray. Let me know if this is ineffective.  Thank you for coming in today. If there are any concerns on your bloodwork, I will let you know. Take care!  Nonallergic Rhinitis Nonallergic rhinitis is inflammation of the mucous membrane inside the nose. The mucous membrane is the tissue that produces mucus. This condition is different from having allergic rhinitis, which is an allergy that affects the nose. Allergic rhinitis occurs when the body's defense system, or immune system, reacts to a substance that a person is allergic to (allergen), such as pollen, pet dander, mold, or dust. Nonallergic rhinitis has many similar symptoms, but it is not caused by  allergens. Nonallergic rhinitis can be an acute or chronic problem. This means it can be short-term or long-term. What are the causes? This condition may be caused by many different things. Some common types of nonallergic rhinitis include: Infectious rhinitis. This is usually caused by an infection in the nose, throat, or upper airways (upper respiratory system). Vasomotor rhinitis. This is the most common type. It is caused by too much blood flow through your nose, and makes your nose swell. It is triggered by strong odors, cold air, stress, drinking alcohol, cigarette smoke, or changes in the weather. Occupational rhinitis. This type is caused by triggers in the workplace, such as chemicals, dust, animal dander, or air pollution. Hormonal rhinitis, in male teens and adults. This type is caused by an increase in the hormone estrogen and may happen during pregnancy, puberty, or monthly menstrual periods. Hormonal rhinitis gives you fewer symptoms when estrogen levels drop. Drug-induced rhinitis. Several types of medicines can cause this, such as medicines for high blood pressure or heart disease, aspirin, or NSAIDs. Nonallergic rhinitis with eosinophilia syndrome (NARES). This type is caused by having too much eosinophil, a type of white blood cell. Other causes include a reaction to eating hot or spicy foods. This does not usually cause long-term symptoms. In some cases, the cause of nonallergic rhinitis is not known. What increases the risk? You are more likely to develop this  condition if: You are 19-64 years of age. You are male. People who are male are twice as likely to have this condition. What are the signs or symptoms? Common symptoms of this condition include: Stuffy nose (nasal congestion). Runny nose. A feeling of mucus dripping down the back of your throat (postnasal drip). Trouble sleeping. Tiredness, or fatigue. Other symptoms include: Sneezing. Coughing. Itchy  nose. Bloodshot eyes. How is this diagnosed? This type may be diagnosed based on: Your symptoms and medical history. A physical exam. Allergy testing to rule out allergic rhinitis. You may have skin tests or blood tests. Your health care provider may also take a swab of nasal discharge to look for an increased number of eosinophils. This is done to confirm a diagnosis of NARES. How is this treated? Treatment for this condition depends on the cause. No single treatment works for everyone. Work with your provider to find the best treatment for you. Treatment may include: Avoiding the things that trigger your symptoms. Medicines to relieve congestion, such as: Steroid nasal spray. There are many types. You may need to try a few to find out which one works best. Decongestant medicine. This treats nasal congestion and may be given by mouth or as a nasal spray. These medicines are used only for a short time. Medicines to relieve a runny nose. These may include antihistamine medicines or decongestant nasal sprays. Nasal irrigation. This involves using a salt-water (saline) spray or saline container called a neti pot. Nasal irrigation helps to clear away mucus and keep your nasal passages moist. Surgery to remove part of your mucous membrane. This is done in severe cases if the condition has not improved after 6-12 months of treatment. Follow these instructions at home: Medicines Take or use over-the-counter and prescription medicines only as told by your provider. Do not stop using your medicine even if you start to feel better. Do not take NSAIDs, such as ibuprofen, or medicines that contain aspirin if they make your symptoms worse. Lifestyle Do not drink alcohol if it makes your symptoms worse. Do not use any products that contain nicotine or tobacco. These products include cigarettes, chewing tobacco, and vaping devices, such as e-cigarettes. If you need help quitting, ask your provider. Avoid  secondhand smoke. General instructions Avoid triggers that make your symptoms worse. Use nasal irrigation as told by your provider. Sleep with the head of your bed raised. This may reduce nasal congestion when you sleep. Drink enough fluid to keep your pee (urine) pale yellow. Contact a health care provider if: You have a fever. Your symptoms are getting worse at home. Your symptoms do not lessen with medicine. You develop new symptoms, especially a headache or nosebleed. Get help right away if: You have difficulty breathing. This symptom may be an emergency. Get help right away. Call 911. Do not wait to see if the symptoms will go away. Do not drive yourself to the hospital. This information is not intended to replace advice given to you by your health care provider. Make sure you discuss any questions you have with your health care provider. Document Revised: 07/15/2022 Document Reviewed: 07/15/2022 Elsevier Patient Education  2024 Elsevier Inc.     Signed,   Reyes Pines, MD [facility], [facility] 06/06/24 11:05 AM

## 2024-06-06 NOTE — Patient Instructions (Addendum)
 No change in your chronic meds at this time.  Try atrovent  nasal spray for possible vasomotor rhinitis. Stop any over the counter decongestant nasal spray. Let me know if this is ineffective.  Thank you for coming in today. If there are any concerns on your bloodwork, I will let you know. Take care!  Nonallergic Rhinitis Nonallergic rhinitis is inflammation of the mucous membrane inside the nose. The mucous membrane is the tissue that produces mucus. This condition is different from having allergic rhinitis, which is an allergy that affects the nose. Allergic rhinitis occurs when the body's defense system, or immune system, reacts to a substance that a person is allergic to (allergen), such as pollen, pet dander, mold, or dust. Nonallergic rhinitis has many similar symptoms, but it is not caused by allergens. Nonallergic rhinitis can be an acute or chronic problem. This means it can be short-term or long-term. What are the causes? This condition may be caused by many different things. Some common types of nonallergic rhinitis include: Infectious rhinitis. This is usually caused by an infection in the nose, throat, or upper airways (upper respiratory system). Vasomotor rhinitis. This is the most common type. It is caused by too much blood flow through your nose, and makes your nose swell. It is triggered by strong odors, cold air, stress, drinking alcohol, cigarette smoke, or changes in the weather. Occupational rhinitis. This type is caused by triggers in the workplace, such as chemicals, dust, animal dander, or air pollution. Hormonal rhinitis, in male teens and adults. This type is caused by an increase in the hormone estrogen and may happen during pregnancy, puberty, or monthly menstrual periods. Hormonal rhinitis gives you fewer symptoms when estrogen levels drop. Drug-induced rhinitis. Several types of medicines can cause this, such as medicines for high blood pressure or heart disease, aspirin,  or NSAIDs. Nonallergic rhinitis with eosinophilia syndrome (NARES). This type is caused by having too much eosinophil, a type of white blood cell. Other causes include a reaction to eating hot or spicy foods. This does not usually cause long-term symptoms. In some cases, the cause of nonallergic rhinitis is not known. What increases the risk? You are more likely to develop this condition if: You are 42-39 years of age. You are male. People who are male are twice as likely to have this condition. What are the signs or symptoms? Common symptoms of this condition include: Stuffy nose (nasal congestion). Runny nose. A feeling of mucus dripping down the back of your throat (postnasal drip). Trouble sleeping. Tiredness, or fatigue. Other symptoms include: Sneezing. Coughing. Itchy nose. Bloodshot eyes. How is this diagnosed? This type may be diagnosed based on: Your symptoms and medical history. A physical exam. Allergy testing to rule out allergic rhinitis. You may have skin tests or blood tests. Your health care provider may also take a swab of nasal discharge to look for an increased number of eosinophils. This is done to confirm a diagnosis of NARES. How is this treated? Treatment for this condition depends on the cause. No single treatment works for everyone. Work with your provider to find the best treatment for you. Treatment may include: Avoiding the things that trigger your symptoms. Medicines to relieve congestion, such as: Steroid nasal spray. There are many types. You may need to try a few to find out which one works best. Decongestant medicine. This treats nasal congestion and may be given by mouth or as a nasal spray. These medicines are used only for a short time.  Medicines to relieve a runny nose. These may include antihistamine medicines or decongestant nasal sprays. Nasal irrigation. This involves using a salt-water (saline) spray or saline container called a neti  pot. Nasal irrigation helps to clear away mucus and keep your nasal passages moist. Surgery to remove part of your mucous membrane. This is done in severe cases if the condition has not improved after 6-12 months of treatment. Follow these instructions at home: Medicines Take or use over-the-counter and prescription medicines only as told by your provider. Do not stop using your medicine even if you start to feel better. Do not take NSAIDs, such as ibuprofen, or medicines that contain aspirin if they make your symptoms worse. Lifestyle Do not drink alcohol if it makes your symptoms worse. Do not use any products that contain nicotine or tobacco. These products include cigarettes, chewing tobacco, and vaping devices, such as e-cigarettes. If you need help quitting, ask your provider. Avoid secondhand smoke. General instructions Avoid triggers that make your symptoms worse. Use nasal irrigation as told by your provider. Sleep with the head of your bed raised. This may reduce nasal congestion when you sleep. Drink enough fluid to keep your pee (urine) pale yellow. Contact a health care provider if: You have a fever. Your symptoms are getting worse at home. Your symptoms do not lessen with medicine. You develop new symptoms, especially a headache or nosebleed. Get help right away if: You have difficulty breathing. This symptom may be an emergency. Get help right away. Call 911. Do not wait to see if the symptoms will go away. Do not drive yourself to the hospital. This information is not intended to replace advice given to you by your health care provider. Make sure you discuss any questions you have with your health care provider. Document Revised: 07/15/2022 Document Reviewed: 07/15/2022 Elsevier Patient Education  2024 [employer].

## 2024-06-07 ENCOUNTER — Ambulatory Visit: Payer: Self-pay | Admitting: Family Medicine

## 2024-06-07 LAB — HEPATITIS C ANTIBODY: Hepatitis C Ab: NONREACTIVE

## 2024-06-14 NOTE — Progress Notes (Signed)
 Called patient and went over labs results. Patient verbalized understanding and does not have any questions or concerns at this time

## 2024-06-15 ENCOUNTER — Other Ambulatory Visit: Payer: Self-pay | Admitting: Hematology

## 2024-06-15 DIAGNOSIS — C9000 Multiple myeloma not having achieved remission: Secondary | ICD-10-CM

## 2024-07-05 ENCOUNTER — Other Ambulatory Visit: Payer: Self-pay

## 2024-07-05 DIAGNOSIS — C9000 Multiple myeloma not having achieved remission: Secondary | ICD-10-CM

## 2024-07-06 ENCOUNTER — Inpatient Hospital Stay

## 2024-07-06 ENCOUNTER — Inpatient Hospital Stay (HOSPITAL_BASED_OUTPATIENT_CLINIC_OR_DEPARTMENT_OTHER): Admitting: Hematology

## 2024-07-06 ENCOUNTER — Inpatient Hospital Stay: Attending: Hematology

## 2024-07-06 VITALS — BP 124/66 | HR 54 | Temp 97.5°F | Resp 20 | Wt 226.4 lb

## 2024-07-06 DIAGNOSIS — D72819 Decreased white blood cell count, unspecified: Secondary | ICD-10-CM | POA: Diagnosis not present

## 2024-07-06 DIAGNOSIS — C9000 Multiple myeloma not having achieved remission: Secondary | ICD-10-CM | POA: Diagnosis not present

## 2024-07-06 DIAGNOSIS — Z7901 Long term (current) use of anticoagulants: Secondary | ICD-10-CM | POA: Diagnosis not present

## 2024-07-06 DIAGNOSIS — Z79899 Other long term (current) drug therapy: Secondary | ICD-10-CM | POA: Diagnosis not present

## 2024-07-06 DIAGNOSIS — Z87891 Personal history of nicotine dependence: Secondary | ICD-10-CM | POA: Insufficient documentation

## 2024-07-06 DIAGNOSIS — C9001 Multiple myeloma in remission: Secondary | ICD-10-CM | POA: Diagnosis present

## 2024-07-06 DIAGNOSIS — Z5111 Encounter for antineoplastic chemotherapy: Secondary | ICD-10-CM | POA: Diagnosis not present

## 2024-07-06 DIAGNOSIS — Z86718 Personal history of other venous thrombosis and embolism: Secondary | ICD-10-CM | POA: Insufficient documentation

## 2024-07-06 DIAGNOSIS — G629 Polyneuropathy, unspecified: Secondary | ICD-10-CM | POA: Diagnosis not present

## 2024-07-06 LAB — CMP (CANCER CENTER ONLY)
ALT: 24 U/L (ref 0–44)
AST: 22 U/L (ref 15–41)
Albumin: 3.8 g/dL (ref 3.5–5.0)
Alkaline Phosphatase: 33 U/L — ABNORMAL LOW (ref 38–126)
Anion gap: 3 — ABNORMAL LOW (ref 5–15)
BUN: 16 mg/dL (ref 8–23)
CO2: 27 mmol/L (ref 22–32)
Calcium: 9.9 mg/dL (ref 8.9–10.3)
Chloride: 108 mmol/L (ref 98–111)
Creatinine: 1 mg/dL (ref 0.61–1.24)
GFR, Estimated: >60 mL/min (ref >60)
Glucose, Bld: 98 mg/dL (ref 70–99)
Potassium: 4.1 mmol/L (ref 3.5–5.1)
Sodium: 138 mmol/L (ref 135–145)
Total Bilirubin: 0.8 mg/dL (ref 0.0–1.2)
Total Protein: 6.4 g/dL — ABNORMAL LOW (ref 6.5–8.1)

## 2024-07-06 LAB — CBC WITH DIFFERENTIAL (CANCER CENTER ONLY)
Abs Immature Granulocytes: 0.01 K/uL (ref 0.00–0.07)
Basophils Absolute: 0.1 K/uL (ref 0.0–0.1)
Basophils Relative: 2 %
Eosinophils Absolute: 0.1 K/uL (ref 0.0–0.5)
Eosinophils Relative: 3 %
HCT: 40.9 % (ref 39.0–52.0)
Hemoglobin: 13.7 g/dL (ref 13.0–17.0)
Immature Granulocytes: 0 %
Lymphocytes Relative: 26 %
Lymphs Abs: 0.7 K/uL (ref 0.7–4.0)
MCH: 27.3 pg (ref 26.0–34.0)
MCHC: 33.5 g/dL (ref 30.0–36.0)
MCV: 81.5 fL (ref 80.0–100.0)
Monocytes Absolute: 0.6 K/uL (ref 0.1–1.0)
Monocytes Relative: 21 %
Neutro Abs: 1.3 K/uL — ABNORMAL LOW (ref 1.7–7.7)
Neutrophils Relative %: 48 %
Platelet Count: 160 K/uL (ref 150–400)
RBC: 5.02 MIL/uL (ref 4.22–5.81)
RDW: 15.1 % (ref 11.5–15.5)
WBC Count: 2.8 K/uL — ABNORMAL LOW (ref 4.0–10.5)
nRBC: 0 % (ref 0.0–0.2)

## 2024-07-06 MED ORDER — SODIUM CHLORIDE 0.9 % IV SOLN: INTRAVENOUS | Status: DC

## 2024-07-06 MED ORDER — ZOLEDRONIC ACID 4 MG/100ML IV SOLN: 4.0000 mg | Freq: Once | INTRAVENOUS | Status: DC

## 2024-07-06 NOTE — Progress Notes (Signed)
 Per [employer], PA-C, OK to hold Zometa  today due to dental procedure occurring next week.

## 2024-07-07 LAB — KAPPA/LAMBDA LIGHT CHAINS
Kappa free light chain: 27.8 mg/L — ABNORMAL HIGH (ref 3.3–19.4)
Kappa, lambda light chain ratio: 1.58 (ref 0.26–1.65)
Lambda free light chains: 17.6 mg/L (ref 5.7–26.3)

## 2024-07-09 ENCOUNTER — Other Ambulatory Visit: Payer: Self-pay | Admitting: Hematology

## 2024-07-09 DIAGNOSIS — C9000 Multiple myeloma not having achieved remission: Secondary | ICD-10-CM

## 2024-07-11 ENCOUNTER — Encounter: Payer: Self-pay | Admitting: Hematology

## 2024-07-11 LAB — MULTIPLE MYELOMA PANEL, SERUM
Albumin SerPl Elph-Mcnc: 3.7 g/dL (ref 2.9–4.4)
Albumin/Glob SerPl: 1.4 (ref 0.7–1.7)
Alpha 1: 0.2 g/dL (ref 0.0–0.4)
Alpha2 Glob SerPl Elph-Mcnc: 0.6 g/dL (ref 0.4–1.0)
B-Globulin SerPl Elph-Mcnc: 0.8 g/dL (ref 0.7–1.3)
Gamma Glob SerPl Elph-Mcnc: 1.2 g/dL (ref 0.4–1.8)
Globulin, Total: 2.7 g/dL (ref 2.2–3.9)
IgA: 160 mg/dL (ref 61–437)
IgG (Immunoglobin G), Serum: 1391 mg/dL (ref 603–1613)
IgM (Immunoglobulin M), Srm: 19 mg/dL (ref 15–143)
Total Protein ELP: 6.4 g/dL (ref 6.0–8.5)

## 2024-07-12 ENCOUNTER — Other Ambulatory Visit: Payer: Self-pay | Admitting: Hematology

## 2024-07-12 ENCOUNTER — Encounter: Payer: Self-pay | Admitting: Hematology

## 2024-07-12 DIAGNOSIS — C9000 Multiple myeloma not having achieved remission: Secondary | ICD-10-CM

## 2024-07-12 NOTE — Progress Notes (Signed)
 HEMATOLOGY/ONCOLOGY CLINIC VISIT NOTE   Date of Service: 07/12/24  Patient Care Team: Levora Reyes SAUNDERS, MD as PCP - General (Family Medicine) Onesimo Emaline Brink, MD as Consulting Physician (Hematology) Tat, Asberry RAMAN, DO as Consulting Physician (Neurology)  CHIEF COMPLAINTS/PURPOSE OF CONSULTATION:  Follow-up for continued evaluation and management of multiple myeloma  HISTORY OF PRESENTING ILLNESS:  Please see previous note for details of initial presentation  INTERVAL HISTORY  Nathan Martinez is a 78 y.o. male, retired Building control surveyor , who is here for continued evaluation and management of multiple myeloma . He notes that he has been doing well and has no acute new concerns. No acute new focal bone pains.  Neuropathy is under control and improved. Notes some grade 1 fatigue but no other notable toxicities from his maintenance dose of Revlimid . No leg pain or swelling.  No chest pain or shortness of breath. No other acute new symptoms.  MEDICAL HISTORY:  Intermittent hypercalcemia - primary hyperparathyroidism status post parathyroidectomy in 2019 HTN HLD Glaucoma History of Helicobacter pylori infection previously treated Chronic leukopenia/neutropenia History of present iron  deficiency anemia Acute DVT  SURGICAL HISTORY: Rt TKA 2018 Parathyroidectomy 2019 B/l cataracts sx 2021  SOCIAL HISTORY: Social History   Socioeconomic History   Marital status: Married    Spouse name: Not on file   Number of children: Not on file   Years of education: Not on file   Highest education level: Bachelor's degree (e.g., BA, AB, BS)  Occupational History   Occupation: retired    Comment: Manufacturing systems engineer  Tobacco Use   Smoking status: Never   Smokeless tobacco: Never  Vaping Use   Vaping status: Never Used  Substance and Sexual Activity   Alcohol use: Not Currently   Drug use: Never   Sexual activity: Not Currently    Birth control/protection: None  Other  Topics Concern   Not on file  Social History Narrative   Right handed   retired   Chief Executive Officer Drivers of Corporate investment banker Strain: Low Risk  (06/06/2024)   Overall Financial Resource Strain (CARDIA)    Difficulty of Paying Living Expenses: Not hard at all  Food Insecurity: No Food Insecurity (06/06/2024)   Hunger Vital Sign    Worried About Running Out of Food in the Last Year: Never true    Ran Out of Food in the Last Year: Never true  Transportation Needs: No Transportation Needs (06/06/2024)   PRAPARE - Administrator, Civil Service (Medical): No    Lack of Transportation (Non-Medical): No  Physical Activity: Insufficiently Active (06/06/2024)   Exercise Vital Sign    Days of Exercise per Week: 1 day    Minutes of Exercise per Session: 40 min  Stress: No Stress Concern Present (06/06/2024)   [employer] of Occupational Health - Occupational Stress Questionnaire    Feeling of Stress: Not at all  Social Connections: Socially Integrated (06/06/2024)   Social Connection and Isolation Panel    Frequency of Communication with Friends and Family: More than three times a week    Frequency of Social Gatherings with Friends and Family: More than three times a week    Attends Religious Services: More than 4 times per year    Active Member of [employer] or Organizations: Yes    Attends Banker Meetings: More than 4 times per year    Marital Status: Married  Catering manager Violence: Not At Risk (03/01/2024)   Humiliation,  Afraid, Rape, and Kick questionnaire    Fear of Current or Ex-Partner: No    Emotionally Abused: No    Physically Abused: No    Sexually Abused: No  Ex smoker (quit 1976) 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  MEDICATIONS:  Current Outpatient Medications on File Prior to Visit   Medication Sig Dispense Refill   amLODipine  (NORVASC ) 5 MG tablet Take 1 tablet (5 mg total) by mouth daily. 90 tablet 1   atorvastatin  (LIPITOR) 20 MG tablet Take 1 tablet (20 mg total) by mouth at bedtime. 90 tablet 1   FERREX 150 150 MG capsule Take 1 capsule by mouth once daily 90 capsule 0   gabapentin  (NEURONTIN ) 300 MG capsule Take 1 capsule by mouth twice daily 180 capsule 0   ipratropium (ATROVENT ) 0.06 % nasal spray Place 1-2 sprays into both nostrils 4 (four) times daily. As needed for nasal congestion 15 mL 5   Krill Oil (OMEGA-3) 500 MG CAPS      latanoprost (XALATAN) 0.005 % ophthalmic solution SMARTSIG:1 Drop(s) In Eye(s) Every Evening     Magnesium Oxide 400 MG CAPS      Multiple Vitamins-Minerals (MULTIVITAMIN MEN 50+) TABS Take by mouth.     olmesartan  (BENICAR ) 40 MG tablet Take 1 tablet (40 mg total) by mouth daily. 90 tablet 1   propranolol  (INDERAL ) 10 MG tablet Take 1 tablet (10 mg total) by mouth 2 (two) times daily. 180 tablet 1   REVLIMID  10 MG capsule TAKE 1 CAPSULE BY MOUTH 1 TIME A DAY FOR 21 DAYS ON THEN 7 DAYS OFF 21 capsule 0   Vitamin D , Ergocalciferol , (DRISDOL ) 1.25 MG (50000 UNIT) CAPS capsule Take 1 capsule (50,000 Units total) by mouth every 14 (fourteen) days. 6 capsule 1   No current facility-administered medications on file prior to visit.     REVIEW OF SYSTEMS:    10 Point review of Systems was done is negative except as noted above.  PHYSICAL EXAMINATION: .BP 124/66   Pulse (!) 54   Temp (!) 97.5 F (36.4 C)   Resp 20   Wt 226 lb 6.4 oz (102.7 kg)   SpO2 98%   BMI 30.71 kg/m  NAD GENERAL:alert, in no acute distress and comfortable SKIN: no acute rashes, no significant lesions EYES: conjunctiva are pink and non-injected, sclera anicteric OROPHARYNX: MMM, no exudates, no oropharyngeal erythema or ulceration NECK: supple, no JVD LYMPH:  no palpable lymphadenopathy in the cervical, axillary or inguinal regions LUNGS: clear to  auscultation b/l with normal respiratory effort HEART: regular rate & rhythm ABDOMEN:  normoactive bowel sounds , non tender, not distended. Extremity: no pedal edema PSYCH: alert & oriented x 3 with fluent speech NEURO: no focal motor/sensory deficits   LABORATORY DATA:  I have reviewed the data as listed  .    Latest Ref Rng & Units 07/06/2024   12:12 PM 04/05/2024    2:04 PM 01/06/2024   12:09 PM  CBC  WBC 4.0 - 10.5 K/uL 2.8  2.6  2.5   Hemoglobin 13.0 - 17.0 g/dL 86.2  86.6  85.7   Hematocrit 39.0 - 52.0 % 40.9  39.1  43.6   Platelets 150 - 400 K/uL 160  133  114    CBC    Component Value Date/Time   WBC 2.8 (L)  07/06/2024 1212   WBC 5.1 08/11/2022 0807   RBC 5.02 07/06/2024 1212   HGB 13.7 07/06/2024 1212   HCT 40.9 07/06/2024 1212   PLT 160 07/06/2024 1212   MCV 81.5 07/06/2024 1212   MCH 27.3 07/06/2024 1212   MCHC 33.5 07/06/2024 1212   RDW 15.1 07/06/2024 1212   LYMPHSABS 0.7 07/06/2024 1212   MONOABS 0.6 07/06/2024 1212   EOSABS 0.1 07/06/2024 1212   BASOSABS 0.1 07/06/2024 1212    .    Latest Ref Rng & Units 07/06/2024   12:12 PM 04/05/2024    2:04 PM 01/06/2024   12:09 PM  CMP  Glucose 70 - 99 mg/dL 98  899  95   BUN 8 - 23 mg/dL 16  13  16    Creatinine 0.61 - 1.24 mg/dL 8.99  8.95  8.86   Sodium 135 - 145 mmol/L 138  139  138   Potassium 3.5 - 5.1 mmol/L 4.1  3.9  4.4   Chloride 98 - 111 mmol/L 108  109  106   CO2 22 - 32 mmol/L 27  28  30    Calcium  8.9 - 10.3 mg/dL 9.9  9.5  89.7   Total Protein 6.5 - 8.1 g/dL 6.4  6.4  6.8   Total Bilirubin 0.0 - 1.2 mg/dL 0.8  0.8  0.8   Alkaline Phos 38 - 126 U/L 33  31  32   AST 15 - 41 U/L 22  24  23    ALT 0 - 44 U/L 24  25  23       IEOP/Immunofixation Order: [id number] Component 9 d ago  PATHOLOGIST CHARGE ID  [id number] - Starr Glatter, M.D.   SERUM IEOP COMMENT   NO DEFINITE CLONALITY DETECTED.   Reviewed and Interpreted By   Starr Glatter, M.D.       RADIOGRAPHIC  STUDIES:   US  lower extremity venous ([id number]) done 08/19/2022 revealed Summary:  RIGHT:  - Findings consistent with acute deep vein thrombosis involving the right  proximal, mid, and distal femoral vein, right popliteal vein, and right  peroneal veins.  - No cystic structure found in the popliteal fossa.     LEFT:  - No evidence of common femoral vein obstruction.  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 Most recent PET restaging scan done 08/13/2022 showed no overt signs of disease progression. Myeloma FISH panel no reported mutations. Cytogenetics-loss of Y chromosome.  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 Recent labs have shown resolution of his leukopenia/neutropenia.  We will continue to monitor on Revlimid .  3) h/o DVT of the right lower extremity Ultrasound right lower extremity 08/19/2022- Findings consistent with acute deep vein thrombosis involving the right  proximal, mid, and distal femoral vein, right popliteal vein, and right  peroneal veins.  Resolved and continues to be on anticoagulation.  4) history of stye -continue f/u with ophthamology Currently not an acute issue and has resolved since the patient has been off Velcade .  PLAN:  -discussed lab results from today, 07/12/24, in detail with patient -CBC shows normal hemoglobin of 13.7 with normal platelets of 160k mild leukopenia with a  WBC count of 2.8k with an ANC of 1.3k CMP is within normal limits Myeloma panel shows no M spike and normal IFE Serum kappa  lambda free light chain showed normal kappa lambda light chain ratio Patient has no clinical or lab evidence of myeloma progression at this time. Mild leukopenia but no other notable toxicities from the patient's maintenance Revlimid  at this time Patient is appropriate to continue maintenance Revlimid  at 10 mg p.o. daily 3 weeks on 1 week off. She continues to be on Eliquis  in the context of his previous history of DVT and for ongoing thromboprophylaxis with Revlimid . Will continue maintenance Zometa  every 3 months Patient recommended to stay up to speed with age-appropriate cancer screening and vaccinations with PCP  FOLLOW-UP:  Check-out note: RTC with Dr Onesimo with labs and next dose of Zometa  in 3 months   The total time spent in the appointment was 30 minutes*.  All of the patient's questions were answered with apparent satisfaction. The patient knows to call the clinic with any problems, questions or concerns.   Emaline Onesimo MD MS AAHIVMS [facility] Hematology/Oncology Physician [facility]  .*Total Encounter Time as defined by the Centers for Medicare and Medicaid Services includes, in addition to the face-to-face time of a patient visit (documented in the note above) non-face-to-face time: obtaining and reviewing outside history, ordering and reviewing medications, tests or procedures, care coordination (communications with other health care professionals or caregivers) and documentation in the medical record.

## 2024-07-13 ENCOUNTER — Encounter: Payer: Self-pay | Admitting: Hematology

## 2024-08-05 ENCOUNTER — Other Ambulatory Visit: Payer: Self-pay | Admitting: Neurology

## 2024-08-05 DIAGNOSIS — G620 Drug-induced polyneuropathy: Secondary | ICD-10-CM

## 2024-08-08 ENCOUNTER — Other Ambulatory Visit: Payer: Self-pay | Admitting: Hematology

## 2024-08-08 DIAGNOSIS — C9000 Multiple myeloma not having achieved remission: Secondary | ICD-10-CM

## 2024-08-11 ENCOUNTER — Other Ambulatory Visit: Payer: Self-pay | Admitting: Family Medicine

## 2024-08-11 DIAGNOSIS — D472 Monoclonal gammopathy: Secondary | ICD-10-CM

## 2024-08-16 ENCOUNTER — Other Ambulatory Visit: Payer: Self-pay | Admitting: Hematology

## 2024-08-16 DIAGNOSIS — C9000 Multiple myeloma not having achieved remission: Secondary | ICD-10-CM

## 2024-08-17 ENCOUNTER — Encounter: Payer: Self-pay | Admitting: Hematology

## 2024-09-03 ENCOUNTER — Other Ambulatory Visit: Payer: Self-pay | Admitting: Hematology

## 2024-09-03 DIAGNOSIS — C9000 Multiple myeloma not having achieved remission: Secondary | ICD-10-CM

## 2024-09-05 ENCOUNTER — Encounter: Payer: Self-pay | Admitting: Hematology

## 2024-09-08 ENCOUNTER — Other Ambulatory Visit: Payer: Self-pay | Admitting: Hematology

## 2024-09-08 DIAGNOSIS — C9000 Multiple myeloma not having achieved remission: Secondary | ICD-10-CM

## 2024-10-04 ENCOUNTER — Other Ambulatory Visit: Payer: Self-pay

## 2024-10-04 DIAGNOSIS — C9000 Multiple myeloma not having achieved remission: Secondary | ICD-10-CM

## 2024-10-04 DIAGNOSIS — I82411 Acute embolism and thrombosis of right femoral vein: Secondary | ICD-10-CM

## 2024-10-05 ENCOUNTER — Inpatient Hospital Stay

## 2024-10-05 ENCOUNTER — Inpatient Hospital Stay: Admitting: Hematology

## 2024-10-05 ENCOUNTER — Inpatient Hospital Stay: Attending: Hematology

## 2024-10-05 VITALS — BP 139/69 | HR 55 | Temp 97.5°F | Resp 20 | Wt 227.5 lb

## 2024-10-05 DIAGNOSIS — C9001 Multiple myeloma in remission: Secondary | ICD-10-CM

## 2024-10-05 DIAGNOSIS — I82411 Acute embolism and thrombosis of right femoral vein: Secondary | ICD-10-CM

## 2024-10-05 DIAGNOSIS — Z79899 Other long term (current) drug therapy: Secondary | ICD-10-CM | POA: Insufficient documentation

## 2024-10-05 DIAGNOSIS — C9000 Multiple myeloma not having achieved remission: Secondary | ICD-10-CM

## 2024-10-05 LAB — CMP (CANCER CENTER ONLY)
ALT: 20 U/L (ref 0–44)
AST: 21 U/L (ref 15–41)
Albumin: 3.9 g/dL (ref 3.5–5.0)
Alkaline Phosphatase: 35 U/L — ABNORMAL LOW (ref 38–126)
Anion gap: 4 — ABNORMAL LOW (ref 5–15)
BUN: 16 mg/dL (ref 8–23)
CO2: 26 mmol/L (ref 22–32)
Calcium: 10.1 mg/dL (ref 8.9–10.3)
Chloride: 108 mmol/L (ref 98–111)
Creatinine: 1.01 mg/dL (ref 0.61–1.24)
GFR, Estimated: >60 mL/min (ref >60)
Glucose, Bld: 115 mg/dL — ABNORMAL HIGH (ref 70–99)
Potassium: 4.1 mmol/L (ref 3.5–5.1)
Sodium: 138 mmol/L (ref 135–145)
Total Bilirubin: 1 mg/dL (ref 0.0–1.2)
Total Protein: 6.8 g/dL (ref 6.5–8.1)

## 2024-10-05 LAB — CBC WITH DIFFERENTIAL (CANCER CENTER ONLY)
Abs Immature Granulocytes: 0.01 K/uL (ref 0.00–0.07)
Basophils Absolute: 0 K/uL (ref 0.0–0.1)
Basophils Relative: 1 %
Eosinophils Absolute: 0.1 K/uL (ref 0.0–0.5)
Eosinophils Relative: 3 %
HCT: 43.3 % (ref 39.0–52.0)
Hemoglobin: 14.4 g/dL (ref 13.0–17.0)
Immature Granulocytes: 0 %
Lymphocytes Relative: 23 %
Lymphs Abs: 0.6 K/uL — ABNORMAL LOW (ref 0.7–4.0)
MCH: 27.4 pg (ref 26.0–34.0)
MCHC: 33.3 g/dL (ref 30.0–36.0)
MCV: 82.5 fL (ref 80.0–100.0)
Monocytes Absolute: 0.4 K/uL (ref 0.1–1.0)
Monocytes Relative: 16 %
Neutro Abs: 1.5 K/uL — ABNORMAL LOW (ref 1.7–7.7)
Neutrophils Relative %: 57 %
Platelet Count: 119 K/uL — ABNORMAL LOW (ref 150–400)
RBC: 5.25 MIL/uL (ref 4.22–5.81)
RDW: 14.7 % (ref 11.5–15.5)
WBC Count: 2.6 K/uL — ABNORMAL LOW (ref 4.0–10.5)
nRBC: 0 % (ref 0.0–0.2)

## 2024-10-05 MED ORDER — ZOLEDRONIC ACID 4 MG/100ML IV SOLN
4.0000 mg | Freq: Once | INTRAVENOUS | Status: AC
  Administered 2024-10-05: 4 mg via INTRAVENOUS
  Filled 2024-10-05: qty 100

## 2024-10-05 MED ORDER — SODIUM CHLORIDE 0.9% FLUSH: 10.0000 mL | Freq: Once | INTRAVENOUS | Status: DC | PRN

## 2024-10-05 MED ORDER — SODIUM CHLORIDE 0.9 % IV SOLN: Freq: Once | INTRAVENOUS | Status: AC

## 2024-10-05 NOTE — Progress Notes (Signed)
 HEMATOLOGY ONCOLOGY PROGRESS NOTE  Date of service: 10/05/2024  Patient Care Team: Levora Reyes SAUNDERS, MD as PCP - General (Family Medicine) Onesimo Emaline Brink, MD as Consulting Physician (Hematology) Tat, Asberry RAMAN, DO as Consulting Physician (Neurology)  CHIEF COMPLAINT/PURPOSE OF CONSULTATION: Follow-up for continued evaluation and management of  multiple myeloma   HISTORY OF PRESENTING ILLNESS:  (12/12/2021) Nathan Martinez is a wonderful 78 y.o. 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 Cathay Lash MD at [place]  on[facility] hematology practice.   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 He was most recently seen by his previous oncologist on 08/29/2021 at which time he was noted to have stable smoldering myeloma with no indication for treatment.   His last PET CT scan was apparently on 07/06/2020 and showed no pathologic marrow uptake or lytic lesions in the bones or any signs of extraosseous plasmacytomas.   His last bone marrow biopsy was done on 08/17/2021 and was noted to have 30% overall marrow cellularity, 15% abnormal plasma cells with kappa light chain clonality, normal plasmablastic , Congo red stain negative .mild focal fibrosis grade 1, adequate iron  levels Myeloma FISH panel apparently unremarkable.   Last available labs from 08/29/2021 showed  -CBC with a hemoglobin of 12.7, WBC count of 2.47k with an ANC of 1.36k and platelets of 163k -CMP 08/17/2021: Sodium 139, potassium 4.5, creatinine 1.07, calcium  10.5 normal LFTs -Last M spike was noted to be 0.99 with a free kappa lambda ratio of 7.81.   Patient  currently notes no new focal bone pains. No fevers no chills no night sweats no unexpected weight loss. Has been eating and drinking well. Notes he is settling into [place] quite well without any concerns.   SUMMARY OF ONCOLOGIC HISTORY: Oncology History  Multiple myeloma (HCC)  01/24/2022 Initial Diagnosis   Multiple myeloma (HCC)   02/21/2022 - 07/25/2022 Chemotherapy   Patient is on Treatment Plan : MYELOMA Induction Transplant Candidates VRd SQ      INTERVAL HISTORY: Nathan Martinez is a 78 y.o. male, retired Building Control Surveyor , who is here for continued evaluation and management of multiple myeloma. He is here with his wife today.  he was last seen by me on 07/06/2024; at the time he mentioned experiencing some grade 1 fatigue but no other notable toxicities from his maintenance dose of Revlimid .  Today, he says that he is doing well and does not have concerns. He is reportedly eating well. Tolerating Revlimid  10 mg w/o cramping or diarrhea - notes he did experience side effects with higher doses. Denies any new infection issues, back pain, new bone pains, leg swelling, or peripheral neuropathy.  He states he is staying active by going to the gym and working with a psychologist, educational.  Reports being UTD on age-recommended vaccinations and cancer screenings - he will discuss with his GI about undergoing one more colonoscopy.  For the holidays, they will be hosting their family.   REVIEW OF SYSTEMS:   10 Point review of systems of done and is negative except as noted above.  MEDICAL HISTORY Past Medical History:  Diagnosis Date   DVT (deep vein thrombosis) in pregnancy    Hyperlipidemia    Hypertension    Multiple myeloma (HCC)    Peripheral neuropathy   Intermittent hypercalcemia - primary hyperparathyroidism status post parathyroidectomy in 2019 HTN HLD Glaucoma History of Helicobacter pylori infection previously treated Chronic leukopenia/neutropenia History of present iron   deficiency anemia Acute DVT   IMMUNIZATION HISTORY Immunization History  Administered Date(s) Administered   Fluad Quad(high Dose 65+) 08/25/2022, 07/27/2023   Fluad Trivalent(High Dose 65+) 08/03/2024   Influenza-Unspecified 08/13/2021   Moderna SARS-COV2 Booster Vaccination 06/26/2022   PFIZER(Purple Top)SARS-COV-2 Vaccination 12/21/2019, 01/11/2020, 08/20/2020, 01/23/2021   Pfizer Covid-19 Vaccine Bivalent Booster 30yrs & up 10/08/2022   Pneumococcal Conjugate-13 08/14/2015   Pneumococcal Polysaccharide-23 10/21/2013, 02/04/2016, 02/23/2021   RSV,unspecified 09/01/2022   Tdap 07/20/2014   Zoster Recombinant(Shingrix) 08/17/2020   Zoster, Unspecified 07/28/2020, 09/03/2022   SURGICAL HISTORY Past Surgical History:  Procedure Laterality Date   CATARACT EXTRACTION, BILATERAL     KNEE SURGERY Right 2018   PARATHYROID EXPLORATION    Rt TKA 2018 Parathyroidectomy 2019 B/l cataracts sx 2021  SOCIAL HISTORY Social History   Tobacco Use   Smoking status: Never   Smokeless tobacco: Never  Vaping Use   Vaping status: Never Used  Substance Use Topics   Alcohol use: Not Currently   Drug use: Never    Social History   Social History Narrative   Right handed   retired    [employer] SDOH Screenings   Food Insecurity: No Food Insecurity (06/06/2024)  Housing: Low Risk  (06/06/2024)  Transportation Needs: No Transportation Needs (06/06/2024)  Utilities: Not At Risk (03/01/2024)  Alcohol Screen: Low Risk  (03/01/2024)  Depression (PHQ2-9): Low Risk  (03/01/2024)  Financial Resource Strain: Low Risk  (06/06/2024)  Physical Activity: Insufficiently Active (06/06/2024)  Social Connections: Socially Integrated (06/06/2024)  Stress: No Stress Concern Present (06/06/2024)  Tobacco Use: Low Risk  (06/06/2024)  Health Literacy: Adequate Health Literacy (03/01/2024)   FAMILY HISTORY Family History  Problem Relation Age of Onset   Tremor Mother    Cancer Sister   Sister -  uterine/ovarian cancer 5's  Mother-- lived into her 22's Father - died in 37's - fall/trauma   ALLERGIES: has no known allergies.  MEDICATIONS  Current Outpatient Medications  Medication Sig Dispense Refill   amLODipine  (NORVASC ) 5 MG tablet Take 1 tablet (5 mg total) by mouth daily. 90 tablet 1   atorvastatin  (LIPITOR) 20 MG tablet Take 1 tablet (20 mg total) by mouth at bedtime. 90 tablet 1   ELIQUIS  5 MG TABS tablet Take 1 tablet by mouth twice daily 60 tablet 0   FERREX 150 150 MG capsule Take 1 capsule by mouth once daily 90 capsule 0   gabapentin  (NEURONTIN ) 300 MG capsule Take 1 capsule by mouth twice daily 180 capsule 0   ipratropium (ATROVENT ) 0.06 % nasal spray Place 1-2 sprays into both nostrils 4 (four) times daily. As needed for nasal congestion 15 mL 5   Krill Oil (OMEGA-3) 500 MG CAPS      latanoprost (XALATAN) 0.005 % ophthalmic solution SMARTSIG:1 Drop(s) In Eye(s) Every Evening     Magnesium Oxide 400 MG CAPS      Multiple Vitamins-Minerals (MULTIVITAMIN MEN 50+) TABS Take by mouth.     olmesartan  (BENICAR ) 40 MG tablet Take 1 tablet (40 mg total) by mouth daily. 90 tablet 1   propranolol  (INDERAL ) 10 MG tablet Take 1 tablet (10 mg total) by mouth  2 (two) times daily. 180 tablet 1   REVLIMID  10 MG capsule Take 1 capsule (10 mg total) by mouth daily for 21 days. Take 7 days off . Repeat cycle. 21 capsule 0   Vitamin D , Ergocalciferol , (DRISDOL ) 1.25 MG (50000 UNIT) CAPS capsule Take 1 capsule (50,000 Units total) by mouth every 14 (fourteen) days. 6 capsule 1   No current facility-administered medications for this visit.    PHYSICAL EXAMINATION: ECOG PERFORMANCE STATUS: 1 - Symptomatic but completely ambulatory VITALS: Vitals:   10/05/24 0945  BP: 139/69  Pulse: (!) 55  Resp: 20  Temp: (!) 97.5 F (36.4 C)  SpO2: 99%   Filed Weights   10/05/24 0945  Weight: 227 lb 8 oz (103.2 kg)   Body mass index is 30.85 kg/m.  GENERAL: alert, in no acute distress  and comfortable SKIN: no acute rashes, no significant lesions EYES: conjunctiva are pink and non-injected, sclera anicteric OROPHARYNX: MMM, no exudates, no oropharyngeal erythema or ulceration NECK: supple, no JVD LYMPH:  no palpable lymphadenopathy in the cervical, axillary or inguinal regions LUNGS: clear to auscultation b/l with normal respiratory effort HEART: regular rate & rhythm ABDOMEN:  normoactive bowel sounds , non tender, not distended, no hepatosplenomegaly Extremity: no pedal edema PSYCH: alert & oriented x 3 with fluent speech NEURO: no focal motor/sensory deficits  LABORATORY DATA:   I have reviewed the data as listed     Latest Ref Rng & Units 10/05/2024    9:14 AM 07/06/2024   12:12 PM 04/05/2024    2:04 PM  CBC EXTENDED  WBC 4.0 - 10.5 K/uL 2.6  2.8  2.6   RBC 4.22 - 5.81 MIL/uL 5.25  5.02  4.76   Hemoglobin 13.0 - 17.0 g/dL 85.5  86.2  86.6   HCT 39.0 - 52.0 % 43.3  40.9  39.1   Platelets 150 - 400 K/uL 119  160  133   NEUT# 1.7 - 7.7 K/uL 1.5  1.3  1.2   Lymph# 0.7 - 4.0 K/uL 0.6  0.7  0.9       Latest Ref Rng & Units 10/05/2024    9:14 AM 07/06/2024   12:12 PM 04/05/2024    2:04 PM  CMP  Glucose 70 - 99 mg/dL 884  98  899   BUN 8 - 23 mg/dL 16  16  13    Creatinine 0.61 - 1.24 mg/dL 8.98  8.99  8.95   Sodium 135 - 145 mmol/L 138  138  139   Potassium 3.5 - 5.1 mmol/L 4.1  4.1  3.9   Chloride 98 - 111 mmol/L 108  108  109   CO2 22 - 32 mmol/L 26  27  28    Calcium  8.9 - 10.3 mg/dL 89.8  9.9  9.5   Total Protein 6.5 - 8.1 g/dL 6.8  6.4  6.4   Total Bilirubin 0.0 - 1.2 mg/dL 1.0  0.8  0.8   Alkaline Phos 38 - 126 U/L 35  33  31   AST 15 - 41 U/L 21  22  24    ALT 0 - 44 U/L 20  24  25        IEOP/Immunofixation Order: [id number] Component 9 d ago  PATHOLOGIST CHARGE ID  [id number] - Starr Glatter, M.D.   SERUM IEOP COMMENT   NO DEFINITE CLONALITY DETECTED.   Reviewed and Interpreted By   Giovanni Insuasti-Beltran, M.D.              US   lower extremity venous ([id number]) done 08/19/2022 revealed Summary:  RIGHT:  - Findings consistent with acute deep vein thrombosis involving the right  proximal, mid, and distal femoral vein, right popliteal vein, and right  peroneal veins.  - No cystic structure found in the popliteal fossa.     LEFT:  - No evidence of common femoral vein obstruction. RADIOGRAPHIC STUDIES: I have personally reviewed the radiological images as listed and agreed with the findings in the report. No results found.   ASSESSMENT & PLAN:  78 y.o. 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 Most recent PET restaging scan done 08/13/2022 showed no overt signs of disease progression. Myeloma FISH panel no reported mutations. Cytogenetics-loss of Y chromosome.   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 Recent labs have shown resolution of his leukopenia/neutropenia.  We will continue to monitor on Revlimid .   3) h/o DVT of the right lower extremity Ultrasound right lower extremity 08/19/2022- Findings consistent with acute deep vein thrombosis involving the right  proximal, mid, and distal femoral vein, right popliteal vein, and right  peroneal veins.  Resolved and continues to be on anticoagulation.   4) history of stye -continue f/u with ophthamology Currently not an acute issue and has resolved since the patient has been off Velcade .  PLAN: - Discussed duration that he will continue to take maintenance dose of Revlimid .  If other factors come into play, such as changing blood  counts then there may be reason to discontinue Revlimid , but for now would prefer he stay on this.   Continue Revlimid  for the forseeable future, if in another couple of years his condition remains stable  Continue Eliquis  in the context of his previous history of DVT and for ongoing thromboprophylaxis with Revlimid .  - Recommended staying up to date with age-recommended cancer screenings.   Discussed colonoscopy guidelines and possible alternatives.  - Reviewed 07/06/2024 Myeloma Panel and Kappa/Lambda Light Chains: M protein undetectable and light chains normal on last Myeloma panel  -discussed labs from today-- CBC - mild leucopenia but stable. M spike undetectable and SFLC ratio wnl  FOLLOW-UP  RTC with Dr Onesimo with labs and next dose of Zometa  in 3 months  The total time spent in the appointment was 23 minutes* .  All of the patient's questions were answered and the patient knows to call the clinic with any problems, questions, or concerns.  Emaline Onesimo MD MS AAHIVMS [facility] Hematology/Oncology Physician [facility]  *Total Encounter Time as defined by the Centers for Medicare and Medicaid Services includes, in addition to the face-to-face time of a patient visit (documented in the note above) non-face-to-face time: obtaining and reviewing outside history, ordering and reviewing medications, tests or procedures, care coordination (communications with other health care professionals or caregivers) and documentation in the medical record.  I,Emily Lagle,acting as a neurosurgeon for Emaline Onesimo, MD.,have documented all relevant documentation on the behalf of Emaline Onesimo, MD,as directed by  Emaline Onesimo, MD while in the presence of Emaline Onesimo, MD.  I have reviewed the above documentation for accuracy and completeness, and I agree with the above.  Myson Levi, MD

## 2024-10-06 LAB — KAPPA/LAMBDA LIGHT CHAINS
Kappa free light chain: 29.2 mg/L — ABNORMAL HIGH (ref 3.3–19.4)
Kappa, lambda light chain ratio: 1.52 (ref 0.26–1.65)
Lambda free light chains: 19.2 mg/L (ref 5.7–26.3)

## 2024-10-07 ENCOUNTER — Other Ambulatory Visit: Payer: Self-pay | Admitting: Hematology

## 2024-10-07 DIAGNOSIS — C9000 Multiple myeloma not having achieved remission: Secondary | ICD-10-CM

## 2024-10-09 ENCOUNTER — Other Ambulatory Visit: Payer: Self-pay | Admitting: Family Medicine

## 2024-10-09 DIAGNOSIS — E559 Vitamin D deficiency, unspecified: Secondary | ICD-10-CM

## 2024-10-10 ENCOUNTER — Encounter: Payer: Self-pay | Admitting: Hematology

## 2024-10-10 LAB — MULTIPLE MYELOMA PANEL, SERUM
Albumin SerPl Elph-Mcnc: 3.7 g/dL (ref 2.9–4.4)
Albumin/Glob SerPl: 1.3 (ref 0.7–1.7)
Alpha 1: 0.2 g/dL (ref 0.0–0.4)
Alpha2 Glob SerPl Elph-Mcnc: 0.6 g/dL (ref 0.4–1.0)
B-Globulin SerPl Elph-Mcnc: 0.8 g/dL (ref 0.7–1.3)
Gamma Glob SerPl Elph-Mcnc: 1.4 g/dL (ref 0.4–1.8)
Globulin, Total: 2.9 g/dL (ref 2.2–3.9)
IgA: 187 mg/dL (ref 61–437)
IgG (Immunoglobin G), Serum: 1490 mg/dL (ref 603–1613)
IgM (Immunoglobulin M), Srm: 30 mg/dL (ref 15–143)
Total Protein ELP: 6.6 g/dL (ref 6.0–8.5)

## 2024-10-11 ENCOUNTER — Encounter: Payer: Self-pay | Admitting: Hematology

## 2024-10-11 NOTE — Telephone Encounter (Signed)
 Okay to refill?

## 2024-10-24 ENCOUNTER — Ambulatory Visit: Payer: Self-pay

## 2024-10-24 NOTE — Progress Notes (Unsigned)
   Acute Office Visit  Subjective:     Patient ID: Nathan Martinez, male    DOB: 04-Dec-1945, 78 y.o.   MRN: [id number]  No chief complaint on file.   HPI  Discussed the use of AI scribe software for clinical note transcription with the patient, who gave verbal consent to proceed.  History of Present Illness      ROS Per HPI      Objective:    There were no vitals taken for this visit.   Physical Exam Vitals and nursing note reviewed.  Constitutional:      General: He is not in acute distress.    Appearance: Normal appearance.  HENT:     Head: Normocephalic and atraumatic.     Right Ear: External ear normal.     Left Ear: External ear normal.     Nose: Nose normal.     Mouth/Throat:     Mouth: Mucous membranes are moist.     Pharynx: Oropharynx is clear.  Eyes:     Extraocular Movements: Extraocular movements intact.  Cardiovascular:     Rate and Rhythm: Normal rate and regular rhythm.     Pulses: Normal pulses.     Heart sounds: Normal heart sounds.  Pulmonary:     Effort: Pulmonary effort is normal. No respiratory distress.     Breath sounds: Normal breath sounds. No wheezing, rhonchi or rales.  Musculoskeletal:        General: Normal range of motion.     Cervical back: Normal range of motion.     Right lower leg: No edema.     Left lower leg: No edema.  Lymphadenopathy:     Cervical: No cervical adenopathy.  Skin:    General: Skin is warm and dry.  Neurological:     General: No focal deficit present.     Mental Status: He is alert and oriented to person, place, and time.  Psychiatric:        Mood and Affect: Mood normal.        Behavior: Behavior normal.     No results found for any visits on 10/25/24.      Assessment & Plan:   Assessment and Plan Assessment & Plan      No orders of the defined types were placed in this encounter.    No orders of the defined types were placed in this encounter.   No follow-ups on  file.  Corean LITTIE Ku, FNP

## 2024-10-24 NOTE — Telephone Encounter (Signed)
 FYI Only or Action Required?: FYI only for provider: appointment scheduled on tomorrow.  Patient was last seen in primary care on 06/06/2024 by Levora Reyes SAUNDERS, MD.  Called Nurse Triage reporting Urinary Frequency. Pt also states he has a cold  Symptoms began a week ago.  Interventions attempted: Nothing.  Symptoms are: unchanged.  Triage Disposition: See Physician Within 24 Hours  Patient/caregiver understands and will follow disposition?: Yes                       Copied from CRM #[id number]. Topic: Clinical - Red Word Triage >> Oct 24, 2024  2:56 PM Mesmerise C wrote: Red Word that prompted transfer to Nurse Triage: Patient has to frequently urinating and sometimes has trouble holding it or can't get to bathroom fast enough and urinates on himself been ongoing for less than a week Reason for Disposition  Urinating more frequently than usual (i.e., frequency) OR new-onset of the feeling of an urgent need to urinate (i.e., urgency)  Answer Assessment - Initial Assessment Questions 1. SYMPTOM: What's the main symptom you're concerned about? (e.g., frequency, incontinence)     frequency 2. ONSET: When did the  frequency  start?     1 week 3. PAIN: Is there any pain? If Yes, ask: How bad is it? (Scale: 1-10; mild, moderate, severe)     no 4. CAUSE: What do you think is causing the symptoms?     unknown 5. OTHER SYMPTOMS: Do you have any other symptoms? (e.g., blood in urine, fever, flank pain, pain with urination)     no  Protocols used: Urinary Symptoms-A-AH

## 2024-10-25 ENCOUNTER — Ambulatory Visit: Admitting: Family Medicine

## 2024-10-25 VITALS — BP 136/76 | HR 64 | Temp 98.6°F | Ht 72.0 in | Wt 223.8 lb

## 2024-10-25 DIAGNOSIS — J069 Acute upper respiratory infection, unspecified: Secondary | ICD-10-CM | POA: Diagnosis not present

## 2024-10-25 DIAGNOSIS — C9000 Multiple myeloma not having achieved remission: Secondary | ICD-10-CM

## 2024-10-25 DIAGNOSIS — R82998 Other abnormal findings in urine: Secondary | ICD-10-CM | POA: Diagnosis not present

## 2024-10-25 DIAGNOSIS — R350 Frequency of micturition: Secondary | ICD-10-CM

## 2024-10-25 DIAGNOSIS — R6889 Other general symptoms and signs: Secondary | ICD-10-CM

## 2024-10-25 DIAGNOSIS — B9689 Other specified bacterial agents as the cause of diseases classified elsewhere: Secondary | ICD-10-CM

## 2024-10-25 DIAGNOSIS — R5383 Other fatigue: Secondary | ICD-10-CM

## 2024-10-25 DIAGNOSIS — D84821 Immunodeficiency due to drugs: Secondary | ICD-10-CM

## 2024-10-25 LAB — CBC WITH DIFFERENTIAL/PLATELET
Basophils Absolute: 0 K/uL (ref 0.0–0.1)
Basophils Relative: 0.8 % (ref 0.0–3.0)
Eosinophils Absolute: 0.1 K/uL (ref 0.0–0.7)
Eosinophils Relative: 2.1 % (ref 0.0–5.0)
HCT: 41.6 % (ref 39.0–52.0)
Hemoglobin: 13.8 g/dL (ref 13.0–17.0)
Lymphocytes Relative: 15.7 % (ref 12.0–46.0)
Lymphs Abs: 0.6 K/uL — ABNORMAL LOW (ref 0.7–4.0)
MCHC: 33.2 g/dL (ref 30.0–36.0)
MCV: 82.8 fl (ref 78.0–100.0)
Monocytes Absolute: 0.9 K/uL (ref 0.1–1.0)
Monocytes Relative: 22.7 % — ABNORMAL HIGH (ref 3.0–12.0)
Neutro Abs: 2.3 K/uL (ref 1.4–7.7)
Neutrophils Relative %: 58.7 % (ref 43.0–77.0)
Platelets: 150 K/uL (ref 150.0–400.0)
RBC: 5.03 Mil/uL (ref 4.22–5.81)
RDW: 15.6 % — ABNORMAL HIGH (ref 11.5–15.5)
WBC: 3.9 K/uL — ABNORMAL LOW (ref 4.0–10.5)

## 2024-10-25 LAB — POCT URINALYSIS DIP (CLINITEK)
Bilirubin, UA: NEGATIVE
Glucose, UA: NEGATIVE mg/dL
Ketones, POC UA: NEGATIVE mg/dL
Leukocytes, UA: NEGATIVE
Nitrite, UA: NEGATIVE
Spec Grav, UA: 1.02 (ref 1.010–1.025)
Urobilinogen, UA: 0.2 U/dL
pH, UA: 6 (ref 5.0–8.0)

## 2024-10-25 LAB — COMPREHENSIVE METABOLIC PANEL WITH GFR
ALT: 29 U/L (ref 0–53)
AST: 28 U/L (ref 0–37)
Albumin: 3.9 g/dL (ref 3.5–5.2)
Alkaline Phosphatase: 35 U/L — ABNORMAL LOW (ref 39–117)
BUN: 20 mg/dL (ref 6–23)
CO2: 31 meq/L (ref 19–32)
Calcium: 9.7 mg/dL (ref 8.4–10.5)
Chloride: 104 meq/L (ref 96–112)
Creatinine, Ser: 1.15 mg/dL (ref 0.40–1.50)
GFR: 60.82 mL/min (ref >60.00)
Glucose, Bld: 92 mg/dL (ref 70–99)
Potassium: 3.9 meq/L (ref 3.5–5.1)
Sodium: 137 meq/L (ref 135–145)
Total Bilirubin: 1.5 mg/dL — ABNORMAL HIGH (ref 0.2–1.2)
Total Protein: 7 g/dL (ref 6.0–8.3)

## 2024-10-25 LAB — POC COVID19 BINAXNOW: SARS Coronavirus 2 Ag: NEGATIVE

## 2024-10-25 LAB — POCT INFLUENZA A/B
Influenza A, POC: NEGATIVE
Influenza B, POC: NEGATIVE

## 2024-10-25 LAB — CK: Total CK: 215 U/L (ref 17–232)

## 2024-10-25 MED ORDER — CEPHALEXIN 500 MG PO CAPS: 500.0000 mg | ORAL_CAPSULE | Freq: Two times a day (BID) | ORAL | 0 refills | Status: DC

## 2024-10-25 NOTE — Patient Instructions (Addendum)
 Urine is dark today, but otherwise looks okay. We will send for culture for confirmation.   We are checking labs today, will be in contact with any results that require further attention  COVID and flu testing are negative today.  I have sent in keflex for you to take twice a day for 7 days. Please eat when you take this medication, it can upset your stomach if you do not. Please be sure to complete the course of antibiotics even if you are feeling better.  ` Follow-up with me for new or worsening symptoms.

## 2024-10-26 LAB — URINE CULTURE: Result:: NO GROWTH

## 2024-10-28 ENCOUNTER — Telehealth: Payer: Self-pay

## 2024-10-28 ENCOUNTER — Encounter: Payer: Self-pay | Admitting: Family Medicine

## 2024-10-28 ENCOUNTER — Ambulatory Visit: Admitting: Family Medicine

## 2024-10-28 VITALS — BP 128/66 | HR 72 | Temp 98.1°F | Resp 17 | Ht 72.0 in | Wt 220.6 lb

## 2024-10-28 DIAGNOSIS — R052 Subacute cough: Secondary | ICD-10-CM

## 2024-10-28 DIAGNOSIS — R3129 Other microscopic hematuria: Secondary | ICD-10-CM

## 2024-10-28 DIAGNOSIS — J019 Acute sinusitis, unspecified: Secondary | ICD-10-CM

## 2024-10-28 LAB — POCT URINALYSIS DIP (MANUAL ENTRY)
Bilirubin, UA: NEGATIVE
Glucose, UA: NEGATIVE mg/dL
Ketones, POC UA: NEGATIVE mg/dL
Leukocytes, UA: NEGATIVE
Nitrite, UA: NEGATIVE
Spec Grav, UA: 1.02 (ref 1.010–1.025)
Urobilinogen, UA: 0.2 U/dL
pH, UA: 6 (ref 5.0–8.0)

## 2024-10-28 LAB — URINALYSIS, MICROSCOPIC ONLY

## 2024-10-28 MED ORDER — AMOXICILLIN-POT CLAVULANATE 875-125 MG PO TABS: 1.0000 | ORAL_TABLET | Freq: Two times a day (BID) | ORAL | 0 refills | Status: DC

## 2024-10-28 MED ORDER — BENZONATATE 100 MG PO CAPS: 100.0000 mg | ORAL_CAPSULE | Freq: Three times a day (TID) | ORAL | 0 refills | Status: DC | PRN

## 2024-10-28 NOTE — Progress Notes (Signed)
 Subjective:  Patient ID: Nathan Martinez, male    DOB: [birth year]  Age: 78 y.o. MRN: [id number]  CC:  Chief Complaint  Patient presents with   Nasal Congestion    HA. Bloody mucus from nose. Cough. Low energy. Sx started 1 week ago. Feeling better than he was a week ago. Went to UC 3 days ago and was tested. Resp panel was negative for all.     HPI Nathan Martinez presents for   Headache, nasal congestion, cough. Started 1 week ago.  He was seen 3 days ago by Corean Ku, FNP.  CBC, CMP obtained as well as urinalysis.  CK normal.  COVID testing negative, flu testing negative, urine culture no growth.  CBC overall stable from previous readings.  Monocytes slightly elevated.  Bilirubin elevated at 1.5 compared to previous readings of 1.0.  Alk phos stable.  There was small blood on urinalysis.  Was initially treated for acute URI, covered with Keflex  for possible sinus and respiratory infection along with his urinary frequency and dark urine suspicious for UTI initially, culture negative as above.  CK was normal.  Has been taking keflex  BID.  Still coughing, but less cough.   Still some headache, sinus and face pain. Blood in mucus when blowing nose, but no nosebleeds.  Discolored nasal discharge past few days - yellow. Concerned that may have sinus infection. No fever. Frontal HA. Has been using Nivage - felt better, less pressure.  No hematuria, no dark urine.  No cough meds.    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   Shoulder pain, left 08/10/2018   DDD (degenerative disc disease), lumbar 09/18/2017    Joint stiffness of spine 09/18/2017   Spinal stenosis of lumbar region without neurogenic claudication 09/18/2017   Muscle weakness (generalized) 08/10/2017   Right knee pain 08/10/2017   BPH (benign prostatic hyperplasia) 08/06/2017   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   Status post total knee replacement, 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   Hyperlipidemia    Hypertension    Multiple myeloma (HCC)    Peripheral neuropathy    Past Surgical History:  Procedure Laterality Date   CATARACT EXTRACTION, BILATERAL     EYE SURGERY  Cateracs removed   20020   JOINT REPLACEMENT  Right Knee replaced Oct, 2018   KNEE SURGERY Right 2018   PARATHYROID EXPLORATION     VASECTOMY  1978   No Known Allergies Prior to Admission medications   Medication Sig Start Date End Date Taking? Authorizing Provider  amLODipine  (NORVASC ) 5 MG tablet Take 1 tablet (5 mg  total) by mouth daily. 06/06/24  Yes Levora Nathan SAUNDERS, MD  atorvastatin  (LIPITOR) 20 MG tablet Take 1 tablet (20 mg total) by mouth at bedtime. 06/06/24  Yes Levora Nathan SAUNDERS, MD  cephALEXin  (KEFLEX ) 500 MG capsule Take 1 capsule (500 mg total) by mouth 2 (two) times daily for 7 days. 10/25/24 11/01/24 Yes Alvia Corean CROME, FNP  Coenzyme Q10 (COQ10 PO) Take by mouth.   Yes [provider]  ELIQUIS  5 MG TABS tablet Take 1 tablet by mouth twice daily 10/07/24  Yes Kale, Gautam Kishore, MD   gabapentin  (NEURONTIN ) 300 MG capsule Take 1 capsule by mouth twice daily 08/05/24  Yes Tat, Asberry RAMAN, DO  Krill Oil (OMEGA-3) 500 MG CAPS  09/30/16  Yes [provider]  latanoprost (XALATAN) 0.005 % ophthalmic solution SMARTSIG:1 Drop(s) In Eye(s) Every Evening 01/28/22  Yes [provider]  Magnesium Oxide 400 MG CAPS    Yes [provider]  Multiple Vitamins-Minerals (MULTIVITAMIN MEN 50+) TABS Take by mouth.   Yes [provider]  olmesartan  (BENICAR ) 40 MG tablet Take 1 tablet (40 mg total) by mouth daily. 06/06/24  Yes Levora Nathan SAUNDERS, MD  propranolol  (INDERAL ) 10 MG tablet Take 1 tablet (10 mg total) by mouth 2 (two) times daily. 06/06/24  Yes Levora Nathan SAUNDERS, MD  REVLIMID  10 MG capsule TAKE 1 CAPSULE BY MOUTH 1 TIME A DAY FOR 21 DAYS ON THEN 7 DAYS OFF. REPEAT CYCLE. 10/10/24  Yes Onesimo Emaline Brink, MD  Riboflavin (B2 PO) Take by mouth.   Yes [provider]  Vitamin D , Ergocalciferol , (DRISDOL ) 1.25 MG (50000 UNIT) CAPS capsule TAKE 1 CAPSULE BY MOUTH EVERY TWO WEEKS 10/11/24  Yes Levora Nathan SAUNDERS, MD  FERREX 150 150 MG capsule Take 1 capsule by mouth once daily 08/11/24   Shanisha Lech R, MD  ipratropium (ATROVENT ) 0.06 % nasal spray Place 1-2 sprays into both nostrils 4 (four) times daily. As needed for nasal congestion Patient not taking: Reported on 10/25/2024 06/06/24   Levora Nathan SAUNDERS, MD   Social History   Socioeconomic History   Marital status: Married    Spouse name: Not on file   Number of children: Not on file   Years of education: Not on file   Highest education level: Bachelor's degree (e.g., BA, AB, BS)  Occupational History   Occupation: retired    Comment: manufacturing systems engineer  Tobacco Use   Smoking status: Never   Smokeless tobacco: Never  Vaping Use   Vaping status: Never Used  Substance and Sexual Activity   Alcohol use: Not Currently   Drug use: Never   Sexual activity: Not Currently    Birth  control/protection: None  Other Topics Concern   Not on file  Social History Narrative   Right handed   retired   Chief Executive Officer Drivers of Corporate Investment Banker Strain: Low Risk  (10/25/2024)   Overall Financial Resource Strain (CARDIA)    Difficulty of Paying Living Expenses: Not hard at all  Food Insecurity: No Food Insecurity (10/25/2024)   Hunger Vital Sign    Worried About Running Out of Food in the Last Year: Never true    Ran Out of Food in the Last Year: Never true  Transportation Needs: No Transportation Needs (10/25/2024)   PRAPARE - Administrator, Civil Service (Medical): No    Lack of Transportation (Non-Medical): No  Physical Activity: Sufficiently Active (10/25/2024)   Exercise Vital Sign    Days  of Exercise per Week: 3 days    Minutes of Exercise per Session: 60 min  Stress: No Stress Concern Present (10/25/2024)   [employer] of Occupational Health - Occupational Stress Questionnaire    Feeling of Stress: Not at all  Social Connections: Socially Integrated (10/25/2024)   Social Connection and Isolation Panel    Frequency of Communication with Friends and Family: Three times a week    Frequency of Social Gatherings with Friends and Family: Twice a week    Attends Religious Services: More than 4 times per year    Active Member of [employer] or Organizations: Yes    Attends Engineer, Structural: More than 4 times per year    Marital Status: Married  Catering Manager Violence: Not At Risk (03/01/2024)   Humiliation, Afraid, Rape, and Kick questionnaire    Fear of Current or Ex-Partner: No    Emotionally Abused: No    Physically Abused: No    Sexually Abused: No    Review of Systems Per HPI.   Objective:   Vitals:   10/28/24 0856  BP: 128/66  Pulse: 72  Resp: 17  Temp: 98.1 F (36.7 C)  TempSrc: Temporal  SpO2: 97%  Weight: 220 lb 9.6 oz (100.1 kg)  Height: 6' (1.829 m)     Physical Exam Vitals reviewed.  Constitutional:       Appearance: He is well-developed.  HENT:     Head: Normocephalic and atraumatic.     Right Ear: Tympanic membrane, ear canal and external ear normal.     Left Ear: Tympanic membrane, ear canal and external ear normal.     Nose: No rhinorrhea.     Mouth/Throat:     Pharynx: No oropharyngeal exudate or posterior oropharyngeal erythema.  Eyes:     Conjunctiva/sclera: Conjunctivae normal.     Pupils: Pupils are equal, round, and reactive to light.  Cardiovascular:     Rate and Rhythm: Normal rate and regular rhythm.     Heart sounds: Normal heart sounds. No murmur heard. Pulmonary:     Effort: Pulmonary effort is normal.     Breath sounds: Normal breath sounds. No wheezing, rhonchi or rales.  Abdominal:     Palpations: Abdomen is soft.     Tenderness: There is no abdominal tenderness.  Musculoskeletal:     Cervical back: Neck supple.  Lymphadenopathy:     Cervical: No cervical adenopathy.  Skin:    General: Skin is warm and dry.     Findings: No rash.  Neurological:     Mental Status: He is alert and oriented to person, place, and time.  Psychiatric:        Behavior: Behavior normal.    Results for orders placed or performed in visit on 10/28/24  POCT urinalysis dipstick   Collection Time: 10/28/24  9:49 AM  Result Value Ref Range   Color, UA straw (A) yellow   Clarity, UA clear clear   Glucose, UA negative negative mg/dL   Bilirubin, UA negative negative   Ketones, POC UA negative negative mg/dL   Spec Grav, UA 8.979 8.989 - 1.025   Blood, UA small (A) negative   pH, UA 6.0 5.0 - 8.0   Protein Ur, POC trace (A) negative mg/dL   Urobilinogen, UA 0.2 0.2 or 1.0 E.U./dL   Nitrite, UA Negative Negative   Leukocytes, UA Negative Negative   He has been followed by urology.  With history of elevated PSA.  Negative urinalysis for blood  on May 19 visit.  24-month follow-up planned - had bloodwork recently, no urine test.    Assessment & Plan:  Nathan Martinez is a 78 y.o. male  . Subacute cough - Plan: benzonatate  (TESSALON ) 100 MG capsule  Acute sinusitis, recurrence not specified, unspecified location - Plan: amoxicillin -clavulanate (AUGMENTIN ) 875-125 MG tablet  Hematuria, microscopic - Plan: POCT urinalysis dipstick  Suspect headache is due to a sinus infection, initial viral syndrome with worsening discolored nasal discharge, sinus pressure/pain and headache.  Will change the first generation cephalosporin to Augmentin  for improved coverage.  Potential side effects discussed.  Symptomatic care discussed for cough, and Tessalon  as prescribed if needed.  Saline nasal spray, Navage is fine for now.  RTC precautions given.  Possible trace hematuria, asymptomatic at this time.  He is followed by urology, recent bloodwork only. Check urine micro then possible urology f/u if positive for blood. RTC precautions if new symptoms.  Meds ordered this encounter  Medications   benzonatate  (TESSALON ) 100 MG capsule    Sig: Take 1 capsule (100 mg total) by mouth 3 (three) times daily as needed for cough.    Dispense:  20 capsule    Refill:  0   amoxicillin -clavulanate (AUGMENTIN ) 875-125 MG tablet    Sig: Take 1 tablet by mouth 2 (two) times daily.    Dispense:  20 tablet    Refill:  0   Patient Instructions  Based on your symptoms I am suspicious for a sinus infection.  Stop the cephalexin  and start Augmentin  which has a little bit better coverage for the bacteria that can cause those infections.  Tessalon  Perles can be used as needed for cough.  Lungs sound clear today, I do not think we need to do an x-ray or blood work at this time, but if any worsening symptoms please be seen.  Continue drink plenty of fluids, saline nasal spray can help to move some of the congestion and lessen the risk of nosebleeds.  Continue use of humidifier.  Let me know if there are questions.   Urine test in office did indicate possible slight blood in the urine.  Without other symptoms I do not  think anything is needed differently at this time.  I do recommend having the urologist recheck your urine test at upcoming appointment.  Be seen if any new or worsening symptoms.  Feel better soon!    Signed,   Nathan Pines, MD [facility], [facility] 10/28/24 9:56 AM

## 2024-10-28 NOTE — Telephone Encounter (Signed)
 Pt returned and had labs drawn

## 2024-10-28 NOTE — Telephone Encounter (Signed)
 Patient left office before given d/c paperwork. Called patient and left vm. If patient calls back - patient needs to STOP Cephalexin  and START Augmentin . Augmentin  was sent to [facility] in [place].

## 2024-10-28 NOTE — Patient Instructions (Addendum)
 Based on your symptoms I am suspicious for a sinus infection.  Stop the cephalexin  and start Augmentin  which has a little bit better coverage for the bacteria that can cause those infections.  Tessalon  Perles can be used as needed for cough.  Lungs sound clear today, I do not think we need to do an x-ray or blood work at this time, but if any worsening symptoms please be seen.  Continue drink plenty of fluids, saline nasal spray can help to move some of the congestion and lessen the risk of nosebleeds.  Continue use of humidifier.  Let me know if there are questions.   Urine test in office did indicate possible slight blood in the urine.  Without other symptoms I do not think anything is needed differently at this time.  I will another urine test to see if there is actually blood and if so, can follow up with your urologist.   Be seen if any new or worsening symptoms.  Feel better soon!

## 2024-11-01 ENCOUNTER — Other Ambulatory Visit: Payer: Self-pay | Admitting: Hematology

## 2024-11-01 ENCOUNTER — Ambulatory Visit: Payer: Self-pay | Admitting: Family Medicine

## 2024-11-01 DIAGNOSIS — C9000 Multiple myeloma not having achieved remission: Secondary | ICD-10-CM

## 2024-11-02 NOTE — Progress Notes (Signed)
 Faxed labs to Dr. JAYSON Devere.   Patient told me he will contact his office today.

## 2024-11-05 ENCOUNTER — Other Ambulatory Visit: Payer: Self-pay | Admitting: Hematology

## 2024-11-05 DIAGNOSIS — C9000 Multiple myeloma not having achieved remission: Secondary | ICD-10-CM

## 2024-11-06 ENCOUNTER — Encounter: Payer: Self-pay | Admitting: Hematology

## 2024-11-12 ENCOUNTER — Other Ambulatory Visit: Payer: Self-pay | Admitting: Family Medicine

## 2024-11-12 DIAGNOSIS — D472 Monoclonal gammopathy: Secondary | ICD-10-CM

## 2024-12-05 ENCOUNTER — Ambulatory Visit: Admitting: Family Medicine

## 2024-12-05 ENCOUNTER — Other Ambulatory Visit: Payer: Self-pay | Admitting: Hematology

## 2024-12-05 ENCOUNTER — Encounter: Payer: Self-pay | Admitting: Family Medicine

## 2024-12-05 VITALS — BP 110/66 | HR 61 | Temp 98.0°F | Resp 14 | Ht 72.0 in | Wt 226.4 lb

## 2024-12-05 DIAGNOSIS — I10 Essential (primary) hypertension: Secondary | ICD-10-CM | POA: Diagnosis not present

## 2024-12-05 DIAGNOSIS — D472 Monoclonal gammopathy: Secondary | ICD-10-CM

## 2024-12-05 DIAGNOSIS — E785 Hyperlipidemia, unspecified: Secondary | ICD-10-CM

## 2024-12-05 DIAGNOSIS — G250 Essential tremor: Secondary | ICD-10-CM | POA: Diagnosis not present

## 2024-12-05 DIAGNOSIS — R7309 Other abnormal glucose: Secondary | ICD-10-CM

## 2024-12-05 DIAGNOSIS — C9000 Multiple myeloma not having achieved remission: Secondary | ICD-10-CM

## 2024-12-05 MED ORDER — OLMESARTAN MEDOXOMIL 40 MG PO TABS: 40.0000 mg | ORAL_TABLET | Freq: Every day | ORAL | 1 refills | Status: AC

## 2024-12-05 MED ORDER — POLYSACCHARIDE IRON COMPLEX 150 MG PO CAPS: 150.0000 mg | ORAL_CAPSULE | Freq: Every day | ORAL | 0 refills | Status: AC

## 2024-12-05 MED ORDER — PROPRANOLOL HCL 10 MG PO TABS: 10.0000 mg | ORAL_TABLET | Freq: Two times a day (BID) | ORAL | 1 refills | Status: AC

## 2024-12-05 MED ORDER — AMLODIPINE BESYLATE 5 MG PO TABS: 5.0000 mg | ORAL_TABLET | Freq: Every day | ORAL | 1 refills | Status: AC

## 2024-12-05 MED ORDER — ATORVASTATIN CALCIUM 20 MG PO TABS: 20.0000 mg | ORAL_TABLET | Freq: Every day | ORAL | 1 refills | Status: AC

## 2024-12-05 NOTE — Progress Notes (Unsigned)
 "  Subjective:  Patient ID: Nathan Martinez, male    DOB: 03-05-1946  Age: 79 y.o. MRN: [id number]  CC:  Chief Complaint  Patient presents with   Hypertension    Patient is doing well. Checks BP at home sometimes. No questions or concerns.     HPI Nathan Martinez presents for follow up.   Recently visited sister who is recovering from cancer tx.   Hypertension: Treated with Norvasc  5 mg daily, Benicar  40 mg daily without any side effects. No lightheadedness/dizziness.  Exercising 2-3 days per week.  Home readings: under 120/70 range BP Readings from Last 3 Encounters:  12/05/24 110/66  10/28/24 128/66  10/25/24 136/76   Lab Results  Component Value Date   CREATININE 1.15 10/25/2024   Lab Results  Component Value Date   HGBA1C 5.7 (H) 11/13/2023    Hyperlipidemia: Treated with Lipitor 20 mg daily, no new myalgias or side effects.  Lab Results  Component Value Date   CHOL 146 06/06/2024   HDL 49.80 06/06/2024   LDLCALC 75 06/06/2024   TRIG 106.0 06/06/2024   CHOLHDL 3 06/06/2024   Lab Results  Component Value Date   ALT 29 10/25/2024   AST 28 10/25/2024   ALKPHOS 35 (L) 10/25/2024   BILITOT 1.5 (H) 10/25/2024   History of multiple myeloma followed by hematology, drug-induced polyneuropathy. See prior notes.  Evaluated by neurology including for treatment of essential tremor.  Gabapentin  for polyneuropathy with adjustment of dosing.  Propranolol  for essential tremor, 10 mg twice daily as higher doses caused bradycardia.  Unable to take primidone due to use of Eliquis  which he takes while on Revlimid .  Gabapentin  300 mg twice daily has been effective.  Continues to follow with hematology, maintenance Zometa  with some improving neuropathy discussed last July. Last visit with hematology in November, M spike was undetectable, SFLC ratio normal, mild leukopenia but stable.  60-month follow-up planned with Zometa .appt in February.  Still doing well with gabapentin  300mg  bid.  Stable control with meds and wearing Hokas. Not worsening, but not resolved.  Considering 2nd opinion on neuropathy and options, but will be seeing neuro this week.    Had covid booster at pharmacy - [facility].   Immunization History  Administered Date(s) Administered   Fluad Quad(high Dose 65+) 08/25/2022, 07/27/2023   Fluad Trivalent(High Dose 65+) 08/03/2024   Influenza-Unspecified 08/13/2021   Moderna SARS-COV2 Booster Vaccination 06/26/2022   PFIZER(Purple Top)SARS-COV-2 Vaccination 12/21/2019, 01/11/2020, 08/20/2020, 01/23/2021   Pfizer Covid-19 Vaccine Bivalent Booster 34yrs & up 10/08/2022   Pneumococcal Conjugate-13 08/14/2015   Pneumococcal Polysaccharide-23 10/21/2013, 02/04/2016, 02/23/2021   RSV,unspecified 09/01/2022   Tdap 07/20/2014   Zoster Recombinant(Shingrix) 08/17/2020   Zoster, Unspecified 07/28/2020, 09/03/2022     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   Shoulder pain, left 08/10/2018   DDD (degenerative disc disease), lumbar 09/18/2017   Joint stiffness of spine 09/18/2017   Spinal stenosis of lumbar region without neurogenic claudication 09/18/2017   Muscle weakness (generalized) 08/10/2017   Right knee pain 08/10/2017   BPH (benign prostatic hyperplasia) 08/06/2017   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   Status post total  knee replacement, 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   Hyperlipidemia    Hypertension    Multiple myeloma (HCC)    Peripheral neuropathy    Past Surgical History:  Procedure Laterality Date   CATARACT EXTRACTION, BILATERAL     EYE SURGERY  Cateracs removed   20020   JOINT REPLACEMENT  Right Knee replaced Oct, 2018   KNEE SURGERY Right 2018   PARATHYROID EXPLORATION     VASECTOMY  1978   Allergies[1] Prior to Admission medications  Medication Sig Start Date End Date Taking? Authorizing Provider  amLODipine  (NORVASC ) 5 MG tablet Take 1 tablet (5 mg total) by mouth daily. 06/06/24  Yes Levora Nathan SAUNDERS, MD  atorvastatin  (LIPITOR) 20 MG tablet Take 1 tablet (20 mg total) by mouth at bedtime. 06/06/24  Yes Levora Nathan SAUNDERS, MD  Coenzyme Q10 (COQ10 PO) Take by mouth.   Yes [provider]  ELIQUIS  5 MG TABS tablet Take 1 tablet by mouth twice daily 11/06/24  Yes Kale, Gautam Kishore, MD  FERREX 150 150 MG capsule Take 1 capsule by mouth once daily 11/14/24  Yes Levora Nathan SAUNDERS, MD  gabapentin  (NEURONTIN ) 300 MG capsule Take 1 capsule by mouth twice daily 08/05/24  Yes Tat, Asberry RAMAN, DO  Krill Oil (OMEGA-3) 500 MG CAPS  09/30/16  Yes [provider]  latanoprost (XALATAN) 0.005 % ophthalmic solution SMARTSIG:1 Drop(s) In Eye(s) Every Evening 01/28/22  Yes [provider]  Magnesium Oxide 400 MG CAPS    Yes [provider]  Multiple Vitamins-Minerals (MULTIVITAMIN MEN 50+) TABS Take by mouth.   Yes [provider]  olmesartan  (BENICAR ) 40 MG tablet Take 1 tablet (40 mg total) by mouth daily. 06/06/24  Yes Levora Nathan SAUNDERS, MD  propranolol  (INDERAL ) 10 MG  tablet Take 1 tablet (10 mg total) by mouth 2 (two) times daily. 06/06/24  Yes Levora Nathan SAUNDERS, MD  REVLIMID  10 MG capsule TAKE 1 CAPSULE BY MOUTH 1 TIME A DAY FOR 21 DAYS ON THEN 7 DAYS OFF. REPEAT CYCLE. 11/01/24  Yes Onesimo Emaline Brink, MD  Riboflavin (B2 PO) Take by mouth.   Yes [provider]  tadalafil (CIALIS) 20 MG tablet Take 20 mg by mouth daily as needed for erectile dysfunction. 11/07/24  Yes [provider]  Vitamin D , Ergocalciferol , (DRISDOL ) 1.25 MG (50000 UNIT) CAPS capsule TAKE 1 CAPSULE BY MOUTH EVERY TWO WEEKS 10/11/24  Yes Levora Nathan SAUNDERS, MD  amoxicillin -clavulanate (AUGMENTIN ) 875-125 MG tablet Take 1 tablet by mouth 2 (two) times daily. Patient not taking: Reported on 12/05/2024 10/28/24   Levora Nathan SAUNDERS, MD  benzonatate  (TESSALON ) 100 MG capsule Take 1 capsule (100 mg total) by mouth 3 (three) times daily as needed for cough. Patient not taking: Reported on 12/05/2024 10/28/24   Levora Nathan SAUNDERS, MD  ipratropium (ATROVENT ) 0.06 % nasal spray Place 1-2 sprays into both nostrils 4 (four) times daily. As needed for nasal congestion Patient not taking:  Reported on 12/05/2024 06/06/24   Levora Nathan SAUNDERS, MD   Social History   Socioeconomic History   Marital status: Married    Spouse name: Not on file   Number of children: Not on file   Years of education: Not on file   Highest education level: Bachelor's degree (e.g., BA, AB, BS)  Occupational History   Occupation: retired    Comment: manufacturing systems engineer  Tobacco Use   Smoking status: Never   Smokeless tobacco: Never  Vaping Use   Vaping status: Never Used  Substance and Sexual Activity   Alcohol use: Not Currently   Drug use: Never   Sexual activity: Not Currently    Birth control/protection: None  Other Topics Concern   Not on file  Social History Narrative   Right handed   retired   Social Drivers of Health   Tobacco Use: Low Risk (12/05/2024)   Patient History    Smoking Tobacco Use:  Never    Smokeless Tobacco Use: Never    Passive Exposure: Not on file  Financial Resource Strain: Low Risk (10/25/2024)   Overall Financial Resource Strain (CARDIA)    Difficulty of Paying Living Expenses: Not hard at all  Food Insecurity: No Food Insecurity (10/25/2024)   Epic    Worried About Radiation Protection Practitioner of Food in the Last Year: Never true    Ran Out of Food in the Last Year: Never true  Transportation Needs: No Transportation Needs (10/25/2024)   Epic    Lack of Transportation (Medical): No    Lack of Transportation (Non-Medical): No  Physical Activity: Sufficiently Active (10/25/2024)   Exercise Vital Sign    Days of Exercise per Week: 3 days    Minutes of Exercise per Session: 60 min  Stress: No Stress Concern Present (10/25/2024)   [employer] of Occupational Health - Occupational Stress Questionnaire    Feeling of Stress: Not at all  Social Connections: Socially Integrated (10/25/2024)   Social Connection and Isolation Panel    Frequency of Communication with Friends and Family: Three times a week    Frequency of Social Gatherings with Friends and Family: Twice a week    Attends Religious Services: More than 4 times per year    Active Member of [employer] or Organizations: Yes    Attends Engineer, Structural: More than 4 times per year    Marital Status: Married  Catering Manager Violence: Not At Risk (03/01/2024)   Humiliation, Afraid, Rape, and Kick questionnaire    Fear of Current or Ex-Partner: No    Emotionally Abused: No    Physically Abused: No    Sexually Abused: No  Depression (PHQ2-9): Low Risk (12/05/2024)   Depression (PHQ2-9)    PHQ-2 Score: 0  Alcohol Screen: Low Risk (03/01/2024)   Alcohol Screen    Last Alcohol Screening Score (AUDIT): 0  Housing: Low Risk (10/25/2024)   Epic    Unable to Pay for Housing in the Last Year: No    Number of Times Moved in the Last Year: 0    Homeless in the Last Year: No  Utilities: Not At Risk (03/01/2024)   [facility]  Utilities    Threatened with loss of utilities: No  Health Literacy: Adequate Health Literacy (03/01/2024)   B1300 Health Literacy    Frequency of need for help with medical instructions: Never    Review of Systems  Constitutional:  Negative for fatigue and unexpected weight change.  Eyes:  Negative for visual disturbance.  Respiratory:  Negative for cough, chest tightness and shortness of breath.   Cardiovascular:  Negative for chest pain, palpitations and leg swelling.  Gastrointestinal:  Negative for abdominal pain and blood in stool.  Neurological:  Negative for dizziness, light-headedness and headaches.     Objective:   Vitals:   12/05/24 1041  BP: 110/66  Pulse: 61  Resp: 14  Temp: 98 F (36.7 C)  TempSrc: Temporal  SpO2: 97%  Weight: 226 lb 6.4 oz (102.7 kg)  Height: 6' (1.829 m)     Physical Exam Vitals reviewed.  Constitutional:      Appearance: He is well-developed.  HENT:     Head: Normocephalic and atraumatic.  Neck:     Vascular: No carotid bruit or JVD.  Cardiovascular:     Rate and Rhythm: Normal rate and regular rhythm.     Heart sounds: Normal heart sounds. No murmur heard. Pulmonary:     Effort: Pulmonary effort is normal.     Breath sounds: Normal breath sounds. No rales.  Musculoskeletal:     Right lower leg: No edema.     Left lower leg: No edema.  Skin:    General: Skin is warm and dry.  Neurological:     Mental Status: He is alert and oriented to person, place, and time.  Psychiatric:        Mood and Affect: Mood normal.        Assessment & Plan:  YIGIT NORKUS is a 79 y.o. male . No diagnosis found.   No orders of the defined types were placed in this encounter.  There are no Patient Instructions on file for this visit.    Signed,   Nathan Pines, MD [facility] 12/05/2024 11:07 AM       [1] No Known Allergies  "

## 2024-12-05 NOTE — Patient Instructions (Addendum)
 Tetanus vaccine available at the pharmacy.   Thank you for coming in today. No change in medications at this time. If there are any concerns on your bloodwork, I will let you know.  Take care!

## 2024-12-09 ENCOUNTER — Other Ambulatory Visit: Payer: Self-pay | Admitting: Hematology

## 2024-12-09 ENCOUNTER — Other Ambulatory Visit: Payer: Self-pay | Admitting: Neurology

## 2024-12-09 ENCOUNTER — Telehealth: Payer: Self-pay

## 2024-12-09 DIAGNOSIS — C9000 Multiple myeloma not having achieved remission: Secondary | ICD-10-CM

## 2024-12-09 DIAGNOSIS — G620 Drug-induced polyneuropathy: Secondary | ICD-10-CM

## 2024-12-09 NOTE — Telephone Encounter (Signed)
 Oral Oncology Patient Advocate Encounter  Prior Authorization for Revlimid  10mg  has been approved.    PA# [id number] Effective dates: 11/09/24 through 12/09/25  Lucie Lamer, CPhT [place]  [facility] Oncology Pharmacy Patient Advocate Specialist [facility] [place] Phone: [phone]  Fax: [phone] [email or web address]

## 2024-12-09 NOTE — Telephone Encounter (Signed)
 Patient last seen 05/25 cx upcoming appointment in Feb and is scheduled for April. Approval to send in meds

## 2024-12-09 NOTE — Telephone Encounter (Signed)
 Oral Oncology Patient Advocate Encounter   Received notification that prior authorization for Revlimid  10mg  is required.   PA submitted on 12/09/24 Key [id number] Status is pending     Lucie Lamer, CPhT [place]  [facility] Oncology Pharmacy Patient Advocate Specialist [facility] [place] Phone: [phone]  Fax: [phone] [email or web address]

## 2024-12-27 ENCOUNTER — Other Ambulatory Visit: Payer: Self-pay

## 2024-12-27 DIAGNOSIS — C9000 Multiple myeloma not having achieved remission: Secondary | ICD-10-CM

## 2024-12-28 ENCOUNTER — Inpatient Hospital Stay: Attending: Hematology

## 2024-12-28 ENCOUNTER — Inpatient Hospital Stay

## 2024-12-28 ENCOUNTER — Other Ambulatory Visit: Payer: Self-pay | Admitting: Family Medicine

## 2024-12-28 ENCOUNTER — Inpatient Hospital Stay: Admitting: Hematology

## 2024-12-28 DIAGNOSIS — C9000 Multiple myeloma not having achieved remission: Secondary | ICD-10-CM

## 2024-12-28 DIAGNOSIS — E559 Vitamin D deficiency, unspecified: Secondary | ICD-10-CM

## 2024-12-28 LAB — CBC WITH DIFFERENTIAL (CANCER CENTER ONLY)
Abs Immature Granulocytes: 0 10*3/uL (ref 0.00–0.07)
Basophils Absolute: 0 10*3/uL (ref 0.0–0.1)
Basophils Relative: 1 %
Eosinophils Absolute: 0.1 10*3/uL (ref 0.0–0.5)
Eosinophils Relative: 4 %
HCT: 42 % (ref 39.0–52.0)
Hemoglobin: 14 g/dL (ref 13.0–17.0)
Immature Granulocytes: 0 %
Lymphocytes Relative: 28 %
Lymphs Abs: 0.6 10*3/uL — ABNORMAL LOW (ref 0.7–4.0)
MCH: 27.3 pg (ref 26.0–34.0)
MCHC: 33.3 g/dL (ref 30.0–36.0)
MCV: 81.9 fL (ref 80.0–100.0)
Monocytes Absolute: 0.2 10*3/uL (ref 0.1–1.0)
Monocytes Relative: 10 %
Neutro Abs: 1.3 10*3/uL — ABNORMAL LOW (ref 1.7–7.7)
Neutrophils Relative %: 57 %
Platelet Count: 115 10*3/uL — ABNORMAL LOW (ref 150–400)
RBC: 5.13 MIL/uL (ref 4.22–5.81)
RDW: 15.1 % (ref 11.5–15.5)
WBC Count: 2.2 10*3/uL — ABNORMAL LOW (ref 4.0–10.5)
nRBC: 0 % (ref 0.0–0.2)

## 2024-12-28 LAB — CMP (CANCER CENTER ONLY)
ALT: 32 U/L (ref 0–44)
AST: 35 U/L (ref 15–41)
Albumin: 4 g/dL (ref 3.5–5.0)
Alkaline Phosphatase: 35 U/L — ABNORMAL LOW (ref 38–126)
Anion gap: 8 (ref 5–15)
BUN: 14 mg/dL (ref 8–23)
CO2: 25 mmol/L (ref 22–32)
Calcium: 10.2 mg/dL (ref 8.9–10.3)
Chloride: 107 mmol/L (ref 98–111)
Creatinine: 1.14 mg/dL (ref 0.61–1.24)
GFR, Estimated: >60 mL/min
Glucose, Bld: 108 mg/dL — ABNORMAL HIGH (ref 70–99)
Potassium: 4.1 mmol/L (ref 3.5–5.1)
Sodium: 140 mmol/L (ref 135–145)
Total Bilirubin: 1.1 mg/dL (ref 0.0–1.2)
Total Protein: 7 g/dL (ref 6.5–8.1)

## 2024-12-28 MED ORDER — APIXABAN 5 MG PO TABS: 5.0000 mg | ORAL_TABLET | Freq: Two times a day (BID) | ORAL | 5 refills | Status: AC

## 2024-12-28 MED ORDER — ZOLEDRONIC ACID 4 MG/100ML IV SOLN
4.0000 mg | Freq: Once | INTRAVENOUS | Status: AC
  Administered 2024-12-28: 4 mg via INTRAVENOUS
  Filled 2024-12-28: qty 100

## 2024-12-28 NOTE — Progress Notes (Shared)
 " HEMATOLOGY ONCOLOGY PROGRESS NOTE  Date of service: 12/28/2024  Patient Care Team: Levora Reyes SAUNDERS, MD as PCP - General (Family Medicine) Onesimo Emaline Brink, MD as Consulting Physician (Hematology) Tat, Asberry RAMAN, DO as Consulting Physician (Neurology) Devere Lonni Righter, MD as Consulting Physician (Urology)  CHIEF COMPLAINT/PURPOSE OF CONSULTATION: Follow-up for continued evaluation and management of multiple myeloma   HISTORY OF PRESENTING ILLNESS: (12/12/2021) Nathan Martinez is a wonderful 79 y.o. 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 Cathay Lash MD at [place]  oncology hematology practice.   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 He was most recently seen by his previous oncologist on 08/29/2021 at which time he was noted to have stable smoldering myeloma with no indication for treatment.   His last PET CT scan was apparently on 07/06/2020 and showed no pathologic marrow uptake or lytic lesions in the bones or any signs of extraosseous plasmacytomas.   His last bone marrow biopsy was done on 08/17/2021 and was noted to have 30% overall marrow cellularity, 15% abnormal plasma cells with kappa light chain clonality, normal plasmablastic , Congo red stain negative .mild focal fibrosis grade 1, adequate iron  levels Myeloma FISH panel apparently unremarkable.   Last available labs from 08/29/2021 showed  -CBC with a hemoglobin of 12.7, WBC count of 2.47k with an ANC of 1.36k and platelets of 163k -CMP 08/17/2021: Sodium 139, potassium 4.5, creatinine 1.07, calcium  10.5 normal LFTs -Last M spike was noted to  be 0.99 with a free kappa lambda ratio of 7.81.   Patient currently notes no new focal bone pains. No fevers no chills no night sweats no unexpected weight loss. Has been eating and drinking well. Notes he is settling into [place] quite well without any concerns.   SUMMARY OF ONCOLOGIC HISTORY: Oncology History  Multiple myeloma (HCC)  01/24/2022 Initial Diagnosis   Multiple myeloma (HCC)   02/21/2022 - 07/25/2022 Chemotherapy   Patient is on Treatment Plan : MYELOMA Induction Transplant Candidates VRd SQ       INTERVAL HISTORY: STEPEN PRINS is a 79 y.o. male who is here today for continued evaluation and management of  multiple myeloma . He is accompanied by his wife.   he was last seen by me on 10/05/2024; at the time he did not have any concerns and was doing well.   Today, he notes that he is feeling some emotional stress due to his sister's rapidly declining health. He notes that her doctors have given her a few weeks left to live. He and his wife will be travelling to see her in [place] .   He notes that he has been doing a lot of snow shoveling over the last few days along with exercise.  He notes that he had a sinus infection recently. He did not take his antibiotics because he did not feel like his symptoms were worsening.  He is tolerating the Revlamid treatment well. He states that he tries to take the medication with food otherwise he experiences nausea.  He has had a crown added to a tooth, but has not  had any major dental issues.  He is taking B12 vitamin and B2 vitamins.  Denies diarrhea and muscle cramps.  REVIEW OF SYSTEMS:   10 Point review of systems of done and is negative except as noted above.  MEDICAL HISTORY Past Medical History:  Diagnosis Date   Arthritis 2010   Minor joint pain   DVT (deep vein thrombosis) in pregnancy    Glaucoma Treatment to control eye pressure.   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   Food Insecurity: No Food Insecurity (10/25/2024)  Housing: Low Risk (10/25/2024)  Transportation Needs: No Transportation Needs (10/25/2024)  Utilities: Not At Risk (03/01/2024)  Alcohol Screen: Low Risk (03/01/2024)  Depression (PHQ2-9): Low Risk (12/05/2024)  Financial Resource Strain: Low Risk (10/25/2024)  Physical Activity: Sufficiently Active (10/25/2024)  Social Connections: Socially Integrated (10/25/2024)  Stress: No Stress Concern Present (10/25/2024)  Tobacco Use: Low Risk (12/05/2024)  Health Literacy: Adequate Health Literacy (03/01/2024)     FAMILY HISTORY Family History  Problem Relation Age of Onset   Tremor Mother    Cancer Sister    Cancer Sister      ALLERGIES: has no known allergies.  MEDICATIONS  Current Outpatient Medications  Medication Sig Dispense Refill   amLODipine  (NORVASC ) 5 MG tablet Take 1 tablet (5 mg total) by mouth daily. 90 tablet 1   atorvastatin  (LIPITOR) 20 MG tablet Take 1 tablet (20 mg total) by mouth at bedtime. 90 tablet 1   Coenzyme Q10 (COQ10 PO) Take by mouth.     ELIQUIS  5 MG TABS tablet Take 1 tablet by mouth twice daily 60 tablet 0   gabapentin  (NEURONTIN ) 300 MG capsule Take 1 capsule by mouth twice daily 180 capsule 0   iron  polysaccharides (FERREX 150) 150 MG capsule Take 1 capsule (150 mg total) by mouth daily. 90 capsule 0   Krill Oil (OMEGA-3) 500 MG CAPS      latanoprost (XALATAN) 0.005 % ophthalmic solution SMARTSIG:1 Drop(s) In Eye(s) Every Evening     Magnesium Oxide 400 MG CAPS       Multiple Vitamins-Minerals (MULTIVITAMIN MEN 50+) TABS Take by mouth.     olmesartan  (BENICAR ) 40 MG tablet Take 1 tablet (40 mg total) by mouth daily. 90 tablet 1   propranolol  (INDERAL ) 10 MG tablet Take 1 tablet (10 mg total) by mouth 2 (two) times daily. 180 tablet 1   REVLIMID  10 MG capsule TAKE 1 CAPSULE BY MOUTH 1 TIME A DAY FOR 21 DAYS ON THEN 7 DAYS OFF. REPEAT CYCLE. 21 capsule 0   Riboflavin (B2 PO) Take by mouth.     tadalafil (CIALIS) 20 MG tablet Take 20 mg by mouth daily as needed for erectile dysfunction.     Vitamin D , Ergocalciferol , (DRISDOL ) 1.25 MG (50000 UNIT) CAPS capsule TAKE 1 CAPSULE BY MOUTH EVERY TWO WEEKS 6 capsule 0   No current facility-administered medications for this visit.    PHYSICAL EXAMINATION:  ECOG PERFORMANCE STATUS: 0 - Asymptomatic VITALS: There were no vitals filed for this visit. There were no vitals filed for this visit. There is no height or weight on file to calculate BMI.  GENERAL: alert, in no acute distress and comfortable SKIN: no acute rashes, no significant lesions EYES: conjunctiva are pink and non-injected, sclera anicteric OROPHARYNX: MMM, no exudates, no oropharyngeal erythema or ulceration NECK: supple, no JVD LYMPH:  no palpable lymphadenopathy in the cervical, axillary or inguinal regions LUNGS: clear to auscultation b/l with normal respiratory effort HEART: regular rate & rhythm ABDOMEN:  normoactive bowel sounds , non tender, not distended, no hepatosplenomegaly Extremity: no pedal edema PSYCH: alert & oriented x 3 with fluent speech NEURO: no focal motor/sensory deficits  LABORATORY DATA:   I have reviewed the data as listed     Latest Ref Rng & Units 12/28/2024    8:33 AM 10/25/2024   11:52 AM 10/05/2024    9:14 AM  CBC EXTENDED  WBC 4.0 - 10.5 K/uL 2.2  3.9  2.6   RBC 4.22 - 5.81 MIL/uL 5.13  5.03  5.25   Hemoglobin 13.0 - 17.0 g/dL 85.9  86.1  85.5   HCT 39.0 - 52.0 % 42.0  41.6  43.3   Platelets 150 - 400  K/uL 115  150.0  119   NEUT# 1.7 - 7.7 K/uL 1.3  2.3  1.5   Lymph# 0.7 - 4.0 K/uL 0.6  0.6  0.6         Latest Ref Rng & Units 12/28/2024    8:33 AM 10/25/2024   11:52 AM 10/05/2024    9:14 AM  CMP  Glucose 70 - 99 mg/dL 891  92  884   BUN 8 - 23 mg/dL 14  20  16    Creatinine 0.61 - 1.24 mg/dL 8.85  8.84  8.98   Sodium 135 - 145 mmol/L 140  137  138   Potassium 3.5 - 5.1 mmol/L 4.1  3.9  4.1   Chloride 98 - 111 mmol/L 107  104  108   CO2 22 - 32 mmol/L 25  31  26    Calcium  8.9 - 10.3 mg/dL 89.7  9.7  89.8   Total Protein 6.5 - 8.1 g/dL 7.0  7.0  6.8   Total Bilirubin 0.0 - 1.2 mg/dL 1.1  1.5  1.0   Alkaline Phos 38 - 126 U/L 35  35  35   AST 15 - 41 U/L 35  28  21   ALT 0 - 44 U/L 32  29  20          IEOP/Immunofixation Order: [id number] Component 9 d ago  PATHOLOGIST CHARGE ID  [id number] - Starr Glatter, M.D.   SERUM IEOP COMMENT   NO DEFINITE CLONALITY DETECTED.   Reviewed and Interpreted By   Starr Glatter, M.D.             US  lower extremity venous ([id number]) done 08/19/2022 revealed Summary:  RIGHT:  - Findings consistent with acute deep vein thrombosis involving the right  proximal, mid, and distal femoral vein, right popliteal vein, and right  peroneal veins.  - No cystic structure found in the popliteal fossa.     LEFT:  - No evidence of common femoral vein obstruction.  RADIOGRAPHIC STUDIES: I have personally reviewed the radiological images as listed and agreed with the findings in the report. No results found.  ASSESSMENT & PLAN:  79 y.o. 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 Most recent PET restaging scan done  08/13/2022 showed no overt signs of disease progression. Myeloma FISH panel no reported mutations. Cytogenetics-loss of Y chromosome.   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 Recent labs have shown resolution of his leukopenia/neutropenia.  We will continue to monitor on Revlimid .   3) h/o DVT of the right lower extremity Ultrasound right lower extremity 08/19/2022- Findings consistent with acute deep vein thrombosis involving the right  proximal, mid, and distal femoral vein, right popliteal vein, and right  peroneal veins.  Resolved and continues to be on anticoagulation.   4) history of stye -continue f/u with ophthamology Currently not an acute issue and has resolved since the patient has been off Velcade .   PLAN: - Discussed lab results on 12/28/2024 in detail with patient: - CMP   - Creatinine:  1.14 - Calcium : 10.2 - CBC  - Hemoglobin: 14.0  - WBC: 2.2  - Platelets: 115 - Multiple Myeloma panel pending - Kappa/ Lambda Light chains pending - Recommended to switch to a B complex vitamin instead of taking B12 and B2 vitamins.  - Most recent myeloma panels show no progression   - Findings found no notable toxicities from Revlamid - Discussed neutropenia and leukopenia - Will give paper prescription for Eliquis  to see if refilling will be cheaper through [employer].  - Will continue taking Zometa  in 2 months  FOLLOW-UP in 2 months for labs and follow-up with Dr. Onesimo.  The total time spent in the appointment was *** minutes* .  All of the patient's questions were answered and the patient knows to call the clinic with any problems, questions, or concerns.  Emaline Onesimo MD MS AAHIVMS [facility] Hematology/Oncology Physician [facility]  *Total Encounter Time as defined by the Centers for Medicare and Medicaid Services includes, in addition to the  face-to-face time of a patient visit (documented in the note above) non-face-to-face time: obtaining and reviewing outside history, ordering and reviewing medications, tests or procedures, care coordination (communications with other health care professionals or caregivers) and documentation in the medical record.  I, Marijo Sharps, acting as a neurosurgeon for Emaline Onesimo, MD.,have documented all relevant documentation on the behalf of Emaline Onesimo, MD,as directed by  Emaline Onesimo, MD while in the presence of Emaline Onesimo, MD.  I have reviewed the above documentation for accuracy and completeness, and I agree with the above.  Emaline Onesimo, MD      [1]  Social History Tobacco Use   Smoking status: Never   Smokeless tobacco: Never  Vaping Use   Vaping status: Never Used  Substance Use Topics   Alcohol use: Not Currently   Drug use: Never   "

## 2024-12-28 NOTE — Patient Instructions (Signed)

## 2024-12-29 LAB — KAPPA/LAMBDA LIGHT CHAINS
Kappa free light chain: 31.3 mg/L — ABNORMAL HIGH (ref 3.3–19.4)
Kappa, lambda light chain ratio: 1.57 (ref 0.26–1.65)
Lambda free light chains: 20 mg/L (ref 5.7–26.3)

## 2024-12-30 ENCOUNTER — Other Ambulatory Visit: Payer: Self-pay | Admitting: Hematology

## 2024-12-30 DIAGNOSIS — C9000 Multiple myeloma not having achieved remission: Secondary | ICD-10-CM

## 2024-12-30 LAB — MULTIPLE MYELOMA PANEL, SERUM
Albumin SerPl Elph-Mcnc: 3.4 g/dL (ref 2.9–4.4)
Albumin/Glob SerPl: 1.1 (ref 0.7–1.7)
Alpha 1: 0.2 g/dL (ref 0.0–0.4)
Alpha2 Glob SerPl Elph-Mcnc: 0.7 g/dL (ref 0.4–1.0)
B-Globulin SerPl Elph-Mcnc: 1 g/dL (ref 0.7–1.3)
Gamma Glob SerPl Elph-Mcnc: 1.4 g/dL (ref 0.4–1.8)
Globulin, Total: 3.2 g/dL (ref 2.2–3.9)
IgA: 221 mg/dL (ref 61–437)
IgG (Immunoglobin G), Serum: 1602 mg/dL (ref 603–1613)
IgM (Immunoglobulin M), Srm: 23 mg/dL (ref 15–143)
Total Protein ELP: 6.6 g/dL (ref 6.0–8.5)

## 2025-01-06 ENCOUNTER — Ambulatory Visit: Admitting: Neurology

## 2025-02-28 ENCOUNTER — Inpatient Hospital Stay

## 2025-02-28 ENCOUNTER — Inpatient Hospital Stay: Attending: Hematology

## 2025-02-28 ENCOUNTER — Inpatient Hospital Stay: Admitting: Hematology

## 2025-03-07 ENCOUNTER — Encounter

## 2025-03-09 ENCOUNTER — Ambulatory Visit: Admitting: Neurology
